# Patient Record
Sex: Female | Born: 1955
Health system: Southern US, Community
[De-identification: ages and names within clinical notes are randomized; demographics above are authoritative.]

## PROBLEM LIST (undated history)

## (undated) DIAGNOSIS — F341 Dysthymic disorder: Secondary | ICD-10-CM

## (undated) DIAGNOSIS — Z9989 Dependence on other enabling machines and devices: Secondary | ICD-10-CM

## (undated) DIAGNOSIS — G4730 Sleep apnea, unspecified: Secondary | ICD-10-CM

## (undated) DIAGNOSIS — M2020 Hallux rigidus, unspecified foot: Secondary | ICD-10-CM

## (undated) DIAGNOSIS — F419 Anxiety disorder, unspecified: Secondary | ICD-10-CM

## (undated) DIAGNOSIS — H269 Unspecified cataract: Secondary | ICD-10-CM

## (undated) DIAGNOSIS — I10 Essential (primary) hypertension: Secondary | ICD-10-CM

## (undated) DIAGNOSIS — G4733 Obstructive sleep apnea (adult) (pediatric): Secondary | ICD-10-CM

## (undated) DIAGNOSIS — R011 Cardiac murmur, unspecified: Secondary | ICD-10-CM

## (undated) DIAGNOSIS — K219 Gastro-esophageal reflux disease without esophagitis: Secondary | ICD-10-CM

## (undated) DIAGNOSIS — R7303 Prediabetes: Secondary | ICD-10-CM

## (undated) DIAGNOSIS — E039 Hypothyroidism, unspecified: Secondary | ICD-10-CM

## (undated) DIAGNOSIS — M159 Polyosteoarthritis, unspecified: Secondary | ICD-10-CM

## (undated) DIAGNOSIS — D259 Leiomyoma of uterus, unspecified: Secondary | ICD-10-CM

## (undated) DIAGNOSIS — Z961 Presence of intraocular lens: Secondary | ICD-10-CM

## (undated) DIAGNOSIS — M5030 Other cervical disc degeneration, unspecified cervical region: Secondary | ICD-10-CM

## (undated) DIAGNOSIS — E079 Disorder of thyroid, unspecified: Secondary | ICD-10-CM

## (undated) DIAGNOSIS — N63 Unspecified lump in unspecified breast: Secondary | ICD-10-CM

## (undated) DIAGNOSIS — M1990 Unspecified osteoarthritis, unspecified site: Secondary | ICD-10-CM

## (undated) DIAGNOSIS — E785 Hyperlipidemia, unspecified: Secondary | ICD-10-CM

## (undated) HISTORY — DX: Other cervical disc degeneration, unspecified cervical region: M50.30

## (undated) HISTORY — DX: Polyosteoarthritis, unspecified: M15.9

## (undated) HISTORY — DX: Hallux rigidus, unspecified foot: M20.20

## (undated) HISTORY — DX: Dysthymic disorder: F34.1

## (undated) HISTORY — PX: EYE SURGERY: SHX253

## (undated) HISTORY — DX: Sleep apnea, unspecified: G47.30

## (undated) HISTORY — DX: Obstructive sleep apnea (adult) (pediatric): Z99.89

## (undated) HISTORY — DX: Unspecified lump in unspecified breast: N63.0

## (undated) HISTORY — DX: Cardiac murmur, unspecified: R01.1

## (undated) HISTORY — PX: COLON SURGERY: SHX602

## (undated) HISTORY — DX: Obstructive sleep apnea (adult) (pediatric): G47.33

## (undated) HISTORY — DX: Unspecified cataract: H26.9

## (undated) HISTORY — DX: Hyperlipidemia, unspecified: E78.5

## (undated) HISTORY — DX: Gastro-esophageal reflux disease without esophagitis: K21.9

## (undated) HISTORY — DX: Disorder of thyroid, unspecified: E07.9

## (undated) HISTORY — DX: Leiomyoma of uterus, unspecified: D25.9

## (undated) HISTORY — PX: FRACTURE SURGERY: SHX138

## (undated) HISTORY — DX: Anxiety disorder, unspecified: F41.9

## (undated) HISTORY — DX: Unspecified osteoarthritis, unspecified site: M19.90

## (undated) HISTORY — DX: Essential (primary) hypertension: I10

## (undated) HISTORY — DX: Presence of intraocular lens: Z96.1

## (undated) HISTORY — PX: COLONOSCOPY: SHX174

## (undated) HISTORY — PX: BUNIONECTOMY: SHX129

## (undated) HISTORY — PX: APPENDECTOMY: SHX54

---
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
## 1990-09-11 HISTORY — PX: MYOMECTOMY: SHX85

## 2003-09-12 HISTORY — PX: THYROIDECTOMY: SHX17

## 2004-09-11 HISTORY — PX: ORIF FINGER FRACTURE: SHX2122

## 2013-09-11 HISTORY — PX: OTHER SURGICAL HISTORY: SHX169

## 2014-09-11 HISTORY — PX: CATARACT EXTRACTION: SUR2

## 2016-07-14 ENCOUNTER — Ambulatory Visit: Payer: Self-pay | Admitting: Family Medicine

## 2016-07-25 ENCOUNTER — Ambulatory Visit (INDEPENDENT_AMBULATORY_CARE_PROVIDER_SITE_OTHER): Payer: 59 | Admitting: Family Medicine

## 2016-07-25 ENCOUNTER — Encounter: Payer: Self-pay | Admitting: Family Medicine

## 2016-07-25 VITALS — BP 122/64 | HR 64 | Temp 98.7°F | Resp 14 | Ht 66.0 in | Wt 171.0 lb

## 2016-07-25 DIAGNOSIS — M159 Polyosteoarthritis, unspecified: Secondary | ICD-10-CM | POA: Diagnosis not present

## 2016-07-25 DIAGNOSIS — E890 Postprocedural hypothyroidism: Secondary | ICD-10-CM

## 2016-07-25 DIAGNOSIS — M5030 Other cervical disc degeneration, unspecified cervical region: Secondary | ICD-10-CM | POA: Diagnosis not present

## 2016-07-25 DIAGNOSIS — K219 Gastro-esophageal reflux disease without esophagitis: Secondary | ICD-10-CM

## 2016-07-25 DIAGNOSIS — F341 Dysthymic disorder: Secondary | ICD-10-CM | POA: Diagnosis not present

## 2016-07-25 DIAGNOSIS — I10 Essential (primary) hypertension: Secondary | ICD-10-CM

## 2016-07-25 DIAGNOSIS — E7800 Pure hypercholesterolemia, unspecified: Secondary | ICD-10-CM | POA: Diagnosis not present

## 2016-07-25 DIAGNOSIS — E785 Hyperlipidemia, unspecified: Secondary | ICD-10-CM | POA: Insufficient documentation

## 2016-07-25 DIAGNOSIS — E039 Hypothyroidism, unspecified: Secondary | ICD-10-CM | POA: Insufficient documentation

## 2016-07-25 DIAGNOSIS — F331 Major depressive disorder, recurrent, moderate: Secondary | ICD-10-CM | POA: Insufficient documentation

## 2016-07-25 MED ORDER — PANTOPRAZOLE SODIUM 20 MG PO TBEC: 20.0000 mg | DELAYED_RELEASE_TABLET | Freq: Every day | ORAL | 1 refills | Status: DC

## 2016-07-25 MED ORDER — PANTOPRAZOLE SODIUM 20 MG PO TBEC: 20.0000 mg | DELAYED_RELEASE_TABLET | Freq: Every day | ORAL | 6 refills | Status: DC

## 2016-07-25 MED ORDER — DULOXETINE HCL 30 MG PO CPEP: 30.0000 mg | ORAL_CAPSULE | Freq: Every day | ORAL | 1 refills | Status: DC

## 2016-07-25 MED ORDER — LEVOTHYROXINE SODIUM 125 MCG PO TABS: 125.0000 ug | ORAL_TABLET | Freq: Every day | ORAL | 1 refills | Status: DC

## 2016-07-25 MED ORDER — ATENOLOL 25 MG PO TABS: 25.0000 mg | ORAL_TABLET | Freq: Every day | ORAL | 1 refills | Status: DC

## 2016-07-25 MED ORDER — ATORVASTATIN CALCIUM 10 MG PO TABS: 10.0000 mg | ORAL_TABLET | Freq: Every day | ORAL | 1 refills | Status: DC

## 2016-07-25 NOTE — Progress Notes (Signed)
Subjective:    Patient ID: Destiny Graham, female    DOB: [birth year], 60 y.o.   MRN: [id number]  Patient presents for [facility] (is not fasting) Patient to establish care. 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  She moved here from [place]. History of hypothyroidism postsurgical she had benign dysplastic nodule removed in 2005 she is on lifelong thyroid replacement. She last had labs done in July of this year.  Hypertension hyperlipidemia diagnosed around the age of 63 father had early coronary artery disease including heart attack carotid stenosis and vascular dementia. She started on statin drugs Lipitor as well as blood pressure medicine at that time   Acid reflux she is currently on omeprazole however her insurance covers pantoprazole would like to switch to this.  Osteoarthritis as well as cervical degenerative disc disease causes some discomfort she tries to stay active.  Obstructive sleep apnea she uses CPAP on a regular basis.  History of dysthymia she has strong family history of depression she's been on Cymbalta secondary to her pain with her arthritis in her depressed mood. She was seen by psychiatry in the past.  Colonoscopy last done at age 29 she also had Pap smear at age 28 she gets her mammogram done every 2 years. She is not sexually active. Immunizations are up-to-date. Bone density also done in the past has been normal.      Review Of Systems:  GEN- denies fatigue, fever, weight loss,weakness, recent illness HEENT- denies eye drainage, change in vision, nasal discharge, CVS- denies chest pain, palpitations RESP- denies SOB, cough, wheeze ABD- denies N/V, change in stools, abd pain GU- denies dysuria, hematuria, dribbling, incontinence MSK- +joint pain, muscle aches, injury Neuro- denies headache, dizziness, syncope, seizure activity       Objective:    BP 122/64 (BP Location: Left Arm, Patient Position: Sitting, Cuff Size:  Normal)   Pulse 64   Temp 98.7 F (37.1 C) (Oral)   Resp 14   Ht 5\' 6"  (1.676 m)   Wt 171 lb (77.6 kg)   SpO2 98%   BMI 27.60 kg/m  GEN- NAD, alert and oriented x3 HEENT- PERRL, EOMI, non injected sclera, pink conjunctiva, MMM, oropharynx clear Neck- Supple, scar noted  CVS- RRR, no murmur RESP-CTAB ABD-NABS,soft,NT,ND Psych- Normal affect and mood EXT- No edema Pulses- Radial, DP- 2+        Assessment & Plan:      Problem List Items Addressed This Visit    Hypothyroidism   Relevant Medications   atenolol (TENORMIN) 25 MG tablet   levothyroxine (SYNTHROID, LEVOTHROID) 125 MCG tablet   Hyperlipidemia    With family history of CAD early age, HTN, hyperlipidemia, recommend starting ASA 81mg  once a day       Relevant Medications   atenolol (TENORMIN) 25 MG tablet   atorvastatin (LIPITOR) 10 MG tablet   GERD (gastroesophageal reflux disease)    CHANGE TO protonix      Relevant Medications   pantoprazole (PROTONIX) 20 MG tablet   Generalized OA   Relevant Medications   acetaminophen (TYLENOL) 325 MG tablet   naproxen sodium (ANAPROX) 220 MG tablet   Essential hypertension    Well controlled obtain records, plan for fasting labs at CPE in Jan      Relevant Medications   atenolol (TENORMIN) 25 MG tablet   atorvastatin (LIPITOR) 10 MG tablet   Dysthymic disorder    Continue Cymbalta      Relevant Medications  DULoxetine (CYMBALTA) 30 MG capsule   DDD (degenerative disc disease), cervical   Relevant Medications   acetaminophen (TYLENOL) 325 MG tablet   naproxen sodium (ANAPROX) 220 MG tablet      Note: This dictation was prepared with Dragon dictation along with smaller phrase technology. Any transcriptional errors that result from this process are unintentional.

## 2016-07-25 NOTE — Patient Instructions (Signed)
Release no of records Start 81mg  Change protonix  F/U January Physical

## 2016-07-26 ENCOUNTER — Encounter: Payer: Self-pay | Admitting: Family Medicine

## 2016-07-26 DIAGNOSIS — K219 Gastro-esophageal reflux disease without esophagitis: Secondary | ICD-10-CM | POA: Insufficient documentation

## 2016-07-26 DIAGNOSIS — M5030 Other cervical disc degeneration, unspecified cervical region: Secondary | ICD-10-CM | POA: Insufficient documentation

## 2016-07-26 DIAGNOSIS — M159 Polyosteoarthritis, unspecified: Secondary | ICD-10-CM | POA: Insufficient documentation

## 2016-07-26 NOTE — Assessment & Plan Note (Signed)
CHANGE TO protonix

## 2016-07-26 NOTE — Assessment & Plan Note (Signed)
Continue Cymbalta.

## 2016-07-26 NOTE — Assessment & Plan Note (Signed)
Well controlled obtain records, plan for fasting labs at CPE in Jan

## 2016-07-26 NOTE — Assessment & Plan Note (Signed)
With family history of CAD early age, HTN, hyperlipidemia, recommend starting ASA 81mg  once a day

## 2016-09-20 ENCOUNTER — Ambulatory Visit: Payer: Self-pay | Admitting: Orthopaedic Surgery

## 2016-09-22 ENCOUNTER — Telehealth: Payer: Self-pay | Admitting: Family Medicine

## 2016-09-22 NOTE — Telephone Encounter (Signed)
Patient is scheduled for CPE at the end of the month. She would like her lab orders put in for the [facility] lab in [place].  CB# [phone]

## 2016-09-25 ENCOUNTER — Other Ambulatory Visit: Payer: Self-pay | Admitting: *Deleted

## 2016-09-25 DIAGNOSIS — E7800 Pure hypercholesterolemia, unspecified: Secondary | ICD-10-CM

## 2016-09-25 DIAGNOSIS — E559 Vitamin D deficiency, unspecified: Secondary | ICD-10-CM

## 2016-09-25 DIAGNOSIS — I10 Essential (primary) hypertension: Secondary | ICD-10-CM

## 2016-09-25 DIAGNOSIS — E890 Postprocedural hypothyroidism: Secondary | ICD-10-CM

## 2016-09-25 NOTE — Telephone Encounter (Signed)
Lab orders faxed to [facility].

## 2016-09-27 ENCOUNTER — Ambulatory Visit: Payer: Self-pay | Admitting: Orthopaedic Surgery

## 2016-10-04 ENCOUNTER — Encounter: Payer: Self-pay | Admitting: Orthopaedic Surgery

## 2016-10-04 ENCOUNTER — Ambulatory Visit (INDEPENDENT_AMBULATORY_CARE_PROVIDER_SITE_OTHER): Payer: 59 | Admitting: Orthopaedic Surgery

## 2016-10-04 VITALS — BP 129/82 | HR 65 | Temp 97.5°F | Ht 66.5 in | Wt 171.0 lb

## 2016-10-04 DIAGNOSIS — M65311 Trigger thumb, right thumb: Secondary | ICD-10-CM | POA: Diagnosis not present

## 2016-10-04 NOTE — Progress Notes (Signed)
Subjective:    Patient ID: Destiny Graham, female    DOB: [birth year], 61 y.o.   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  She has no trauma.  She had an appointment here two weeks ago but I had to leave out of town for family emergency.  Last week it snowed an the office was closed.  She has had less triggering of the right thumb over the last few days but it varies.  She has no redness or numbness.  She has some swelling over the A1 pulley and may have a very small ganglion there as well.  Conservative treatment has not helped.    I have told her that the definitive treatment for this problem is surgical release of the A1 pulley.  I will inject the area today and hopefully it will help for some time.  Sh eis agreeable to this.   Review of Systems  HENT: Negative for congestion.   Respiratory: Negative for cough and shortness of breath.   Cardiovascular: Negative for chest pain and leg swelling.  Endocrine: Negative for cold intolerance.  Musculoskeletal: Positive for arthralgias.  Allergic/Immunologic: Negative for environmental allergies.   Past Medical History:  Diagnosis Date  . Breast nodule   . DDD (degenerative disc disease), cervical   . Dysthymia   . Generalized OA   . GERD (gastroesophageal reflux disease)   . Hallux rigidus   . Heart murmur   . Hyperlipidemia   . Hypertension   . OSA on CPAP   . Pseudophakia   . Thyroid disease   . Uterine fibroid     Past Surgical History:  Procedure Laterality Date  . BUNIONECTOMY    . CATARACT EXTRACTION  2016  . fusion   2015   first MTP B  . MYOMECTOMY  1992  . ORIF FINGER FRACTURE  2006  . THYROIDECTOMY  2005  . TONSILLECTOMY  1960    Current Outpatient Prescriptions on File Prior to Visit  Medication Sig Dispense Refill  . atenolol (TENORMIN) 25 MG tablet Take 1 tablet (25 mg total) by mouth daily. 90 tablet 1  . atorvastatin  (LIPITOR) 10 MG tablet Take 1 tablet (10 mg total) by mouth daily. 90 tablet 1  . levothyroxine (SYNTHROID, LEVOTHROID) 125 MCG tablet Take 1 tablet (125 mcg total) by mouth daily before breakfast. 90 tablet 1  . pantoprazole (PROTONIX) 20 MG tablet Take 1 tablet (20 mg total) by mouth daily. 90 tablet 1  . acetaminophen (TYLENOL) 325 MG tablet Take 650 mg by mouth every 6 (six) hours as needed.    . cetirizine (ZYRTEC) 10 MG chewable tablet Chew 10 mg by mouth daily.    . Cholecalciferol (VITAMIN D) 2000 units CAPS Take by mouth.    . doxylamine, Sleep, (UNISOM) 25 MG tablet Take 12.5 mg by mouth at bedtime as needed.    . DULoxetine (CYMBALTA) 30 MG capsule Take 1 capsule (30 mg total) by mouth daily. 90 capsule 1  . naproxen sodium (ANAPROX) 220 MG tablet Take 440 mg by mouth 2 (two) times daily as needed.     No current facility-administered medications on file prior to visit.     Social History   Social History  . Marital status: Divorced    Spouse name: N/A  . Number of children: N/A  . Years of education: N/A   Occupational History  .  Not on file.   Social History Main Topics  . Smoking status: Never Smoker  . Smokeless tobacco: Never Used  . Alcohol use Yes  . Drug use: No  . Sexual activity: Not Currently   Other Topics Concern  . Not on file   Social History Narrative  . No narrative on file    Family History  Problem Relation Age of Onset  . Arthritis Mother   . Depression Mother   . COPD Father   . Hearing loss Father   . Heart disease Father   . Hyperlipidemia Father   . Hypertension Father   . Kidney disease Father   . Alcohol abuse Father   . Depression Sister   . Alcohol abuse Sister   . Heart disease Maternal Grandmother     CHF  . COPD Maternal Grandfather   . Alcohol abuse Paternal Grandmother   . Depression Sister   . Heart disease Sister   . Hyperlipidemia Sister   . Depression Sister   . Heart disease Sister   . Hyperlipidemia Sister      BP 129/82   Pulse 65   Temp 97.5 F (36.4 C)   Ht 5' 6.5" (1.689 m)   Wt 171 lb (77.6 kg)   BMI 27.19 kg/m      Objective:   Physical Exam  Constitutional: She is oriented to person, place, and time. She appears well-developed and well-nourished.  HENT:  Head: Normocephalic and atraumatic.  Eyes: Conjunctivae and EOM are normal. Pupils are equal, round, and reactive to light.  Neck: Normal range of motion. Neck supple.  Cardiovascular: Normal rate, regular rhythm and intact distal pulses.   Pulmonary/Chest: Effort normal.  Abdominal: Soft.  Musculoskeletal: She exhibits tenderness (The right dominant thumb has locking at the A1 pulley but not consistently.  there is some swelling at the a! pulley area and a very small ganglion may be present.  NV intact.  Other hand negative.).  Neurological: She is alert and oriented to person, place, and time. She displays normal reflexes. No cranial nerve deficit. She exhibits normal muscle tone. Coordination normal.  Skin: Skin is warm and dry.  Psychiatric: She has a normal mood and affect. Her behavior is normal. Judgment and thought content normal.          Assessment & Plan:   Encounter Diagnosis  Name Primary?  . Trigger thumb, right thumb Yes   Procedure note: After permission from the patient and prep of the skin, the A1 pulley area of the right thumb was injected by sterile technique with 1 % Xylocaine and 1 cc of DepoMedrol 40 tolerated well.  Return as needed.  Call if any problem.  Surgery may need to be done.  Electronically Signed Sanjuana Kava, MD 1/24/20189:38 AM

## 2016-10-06 ENCOUNTER — Other Ambulatory Visit: Payer: Self-pay | Admitting: Family Medicine

## 2016-10-06 DIAGNOSIS — E7800 Pure hypercholesterolemia, unspecified: Secondary | ICD-10-CM | POA: Diagnosis not present

## 2016-10-06 DIAGNOSIS — E559 Vitamin D deficiency, unspecified: Secondary | ICD-10-CM | POA: Diagnosis not present

## 2016-10-06 DIAGNOSIS — E890 Postprocedural hypothyroidism: Secondary | ICD-10-CM | POA: Diagnosis not present

## 2016-10-06 DIAGNOSIS — I10 Essential (primary) hypertension: Secondary | ICD-10-CM | POA: Diagnosis not present

## 2016-10-06 LAB — CBC WITH DIFFERENTIAL/PLATELET
BASOS PCT: 1 %
Basophils Absolute: 58 cells/uL (ref 0–200)
EOS ABS: 174 {cells}/uL (ref 15–500)
Eosinophils Relative: 3 %
HEMATOCRIT: 39.7 % (ref 35.0–45.0)
Hemoglobin: 12.7 g/dL (ref 12.0–15.0)
Lymphocytes Relative: 38 %
Lymphs Abs: 2204 cells/uL (ref 850–3900)
MCH: 29.9 pg (ref 27.0–33.0)
MCHC: 32 g/dL (ref 32.0–36.0)
MCV: 93.4 fL (ref 80.0–100.0)
MONO ABS: 348 {cells}/uL (ref 200–950)
MONOS PCT: 6 %
MPV: 9.8 fL (ref 7.5–12.5)
Neutro Abs: 3016 cells/uL (ref 1500–7800)
Neutrophils Relative %: 52 %
PLATELETS: 326 10*3/uL (ref 140–400)
RBC: 4.25 MIL/uL (ref 3.80–5.10)
RDW: 14.2 % (ref 11.0–15.0)
WBC: 5.8 10*3/uL (ref 3.8–10.8)

## 2016-10-06 LAB — TSH: TSH: 1.85 m[IU]/L

## 2016-10-07 LAB — COMPREHENSIVE METABOLIC PANEL
ALT: 14 U/L (ref 6–29)
AST: 15 U/L (ref 10–35)
Albumin: 4 g/dL (ref 3.6–5.1)
Alkaline Phosphatase: 72 U/L (ref 33–130)
BUN: 15 mg/dL (ref 7–25)
CO2: 23 mmol/L (ref 20–31)
CREATININE: 1.04 mg/dL — AB (ref 0.50–0.99)
Calcium: 9.3 mg/dL (ref 8.6–10.4)
Chloride: 100 mmol/L (ref 98–110)
GLUCOSE: 96 mg/dL (ref 70–99)
Potassium: 4.3 mmol/L (ref 3.5–5.3)
SODIUM: 138 mmol/L (ref 135–146)
TOTAL PROTEIN: 6.3 g/dL (ref 6.1–8.1)
Total Bilirubin: 0.5 mg/dL (ref 0.2–1.2)

## 2016-10-07 LAB — LIPID PANEL
CHOL/HDL RATIO: 2.2 ratio (ref <5.0)
CHOLESTEROL: 196 mg/dL (ref <200)
HDL: 90 mg/dL (ref >50)
LDL CALC: 83 mg/dL (ref <100)
Triglycerides: 117 mg/dL (ref <150)
VLDL: 23 mg/dL (ref <30)

## 2016-10-07 LAB — VITAMIN D 25 HYDROXY (VIT D DEFICIENCY, FRACTURES): VIT D 25 HYDROXY: 38 ng/mL (ref 30–100)

## 2016-10-10 ENCOUNTER — Encounter: Payer: Self-pay | Admitting: Family Medicine

## 2016-10-10 ENCOUNTER — Ambulatory Visit (INDEPENDENT_AMBULATORY_CARE_PROVIDER_SITE_OTHER): Payer: 59 | Admitting: Family Medicine

## 2016-10-10 VITALS — BP 130/68 | HR 68 | Temp 98.9°F | Resp 18 | Ht 66.5 in | Wt 176.0 lb

## 2016-10-10 DIAGNOSIS — E890 Postprocedural hypothyroidism: Secondary | ICD-10-CM | POA: Diagnosis not present

## 2016-10-10 DIAGNOSIS — K219 Gastro-esophageal reflux disease without esophagitis: Secondary | ICD-10-CM

## 2016-10-10 DIAGNOSIS — F418 Other specified anxiety disorders: Secondary | ICD-10-CM | POA: Diagnosis not present

## 2016-10-10 DIAGNOSIS — F341 Dysthymic disorder: Secondary | ICD-10-CM

## 2016-10-10 DIAGNOSIS — Z0000 Encounter for general adult medical examination without abnormal findings: Secondary | ICD-10-CM | POA: Diagnosis not present

## 2016-10-10 DIAGNOSIS — E7800 Pure hypercholesterolemia, unspecified: Secondary | ICD-10-CM

## 2016-10-10 DIAGNOSIS — Z9989 Dependence on other enabling machines and devices: Secondary | ICD-10-CM | POA: Diagnosis not present

## 2016-10-10 DIAGNOSIS — G4733 Obstructive sleep apnea (adult) (pediatric): Secondary | ICD-10-CM

## 2016-10-10 MED ORDER — DULOXETINE HCL 40 MG PO CPEP: 40.0000 mg | ORAL_CAPSULE | Freq: Every day | ORAL | 6 refills | Status: DC

## 2016-10-10 MED ORDER — TRETINOIN (FACIAL WRINKLES) 0.02 % EX CREA: TOPICAL_CREAM | CUTANEOUS | 3 refills | Status: DC

## 2016-10-10 MED ORDER — ATORVASTATIN CALCIUM 20 MG PO TABS: 20.0000 mg | ORAL_TABLET | Freq: Every day | ORAL | 2 refills | Status: DC

## 2016-10-10 MED ORDER — PANTOPRAZOLE SODIUM 40 MG PO TBEC: 40.0000 mg | DELAYED_RELEASE_TABLET | Freq: Every day | ORAL | 2 refills | Status: DC

## 2016-10-10 NOTE — Patient Instructions (Signed)
F/U 6 months

## 2016-10-10 NOTE — Progress Notes (Signed)
   Subjective:    Patient ID: Destiny Graham, female    DOB: [birth year], 61 y.o.   MRN: [id number]  Patient presents for CPE (has had labs) and Wart (would like cryo on wart to hand)  1. Family history of CAD, sister recently diagnosed with Carotid disease, she therefore increased lipitor to 20mg    2. GERD- 20mg  of protonix did not control symptoms, wants to resume 40mg    3. Wart to Left index, has tried OTC remedies has not resolved present for a few months  4. Request script for Renova for wrinkles has used in past   5. CPAP- Doing well , no concerns   6. Anxiety- has had increased stress/anxiety with work, she is not happy in current practice, this has caused more stress, racing thoughts, sleep is okay. Taking Cymbalta 30mg   7. Trigger finger- had injection by Dr. Luna Glasgow   Colonoscopy- UTD  Mammogram- DUE PAP Smear - utd   Review Of Systems:  GEN- denies fatigue, fever, weight loss,weakness, recent illness HEENT- denies eye drainage, change in vision, nasal discharge, CVS- denies chest pain, palpitations RESP- denies SOB, cough, wheeze ABD- denies N/V, change in stools, abd pain GU- denies dysuria, hematuria, dribbling, incontinence MSK- denies joint pain, muscle aches, injury Neuro- denies headache, dizziness, syncope, seizure activity       Objective:    BP 130/68 (BP Location: Left Arm, Patient Position: Sitting, Cuff Size: Normal)   Pulse 68   Temp 98.9 F (37.2 C) (Oral)   Resp 18   Ht 5' 6.5" (1.689 m)   Wt 176 lb (79.8 kg)   SpO2 98%   BMI 27.98 kg/m  GEN- NAD, alert and oriented x3 HEENT- PERRL, EOMI, non injected sclera, pink conjunctiva, MMM, oropharynx clear, TM clear bilat  Neck- Supple, no thyromegaly CVS- RRR, no murmur RESP-CTAB ABD-NABS,soft,NT,ND Psych normal affect and mood Skin- left index finger small wart at edge medial aspect of nail MSK- triggering right thumb EXT- No edema Pulses- Radial, DP- 2+  Cryotherapy:  Verbal consent  obtained, Liquid nitrogen delivered via spray gun 10sec x 2 passes to wart, pt tolerated well       Assessment & Plan:      Problem List Items Addressed This Visit    OSA on CPAP    continues to benefit from CPAP Therapy      Hypothyroidism    TFT at goal      Hyperlipidemia    Continue liptor 20mg       Relevant Medications   atorvastatin (LIPITOR) 20 MG tablet   GERD (gastroesophageal reflux disease)    Increase protonix  to 40mg        Relevant Medications   pantoprazole (PROTONIX) 40 MG tablet   Dysthymic disorder    Anxiety with depresison symptoms Increase cymbalta to 40mg       Relevant Medications   DULoxetine 40 MG CPEP    Other Visit Diagnoses    Routine general medical examination at a health care facility    -  Primary   CPE done, labs reviewed, pt to schedule mammogram. Cyrotherapy for wart done. RETIN SCRIPT GIVEN    Situational anxiety       Relevant Medications   DULoxetine 40 MG CPEP      Note: This dictation was prepared with Dragon dictation along with smaller phrase technology. Any transcriptional errors that result from this process are unintentional.

## 2016-10-11 NOTE — Assessment & Plan Note (Signed)
Continue liptor 20mg 

## 2016-10-11 NOTE — Assessment & Plan Note (Signed)
Anxiety with depresison symptoms Increase cymbalta to 40mg 

## 2016-10-11 NOTE — Assessment & Plan Note (Signed)
TFT at goal 

## 2016-10-11 NOTE — Assessment & Plan Note (Addendum)
Increase protonix  to 40mg 

## 2016-10-11 NOTE — Assessment & Plan Note (Signed)
continues to benefit from CPAP Therapy

## 2016-10-19 DIAGNOSIS — G4733 Obstructive sleep apnea (adult) (pediatric): Secondary | ICD-10-CM | POA: Diagnosis not present

## 2016-11-07 DIAGNOSIS — K1379 Other lesions of oral mucosa: Secondary | ICD-10-CM | POA: Diagnosis not present

## 2016-12-13 ENCOUNTER — Emergency Department (HOSPITAL_COMMUNITY): Payer: 59

## 2016-12-13 ENCOUNTER — Inpatient Hospital Stay (HOSPITAL_COMMUNITY)
Admission: EM | Admit: 2016-12-13 | Discharge: 2016-12-14 | DRG: 866 | Disposition: A | Discharge status: Home / Self Care | Payer: 59 | Attending: Internal Medicine | Admitting: Internal Medicine

## 2016-12-13 ENCOUNTER — Encounter (HOSPITAL_COMMUNITY): Payer: Self-pay | Admitting: *Deleted

## 2016-12-13 ENCOUNTER — Emergency Department (HOSPITAL_COMMUNITY)
Admission: EM | Admit: 2016-12-13 | Discharge: 2016-12-13 | Disposition: A | Discharge status: Home / Self Care | Payer: 59 | Source: Home / Self Care | Attending: Emergency Medicine | Admitting: Emergency Medicine

## 2016-12-13 DIAGNOSIS — G4733 Obstructive sleep apnea (adult) (pediatric): Secondary | ICD-10-CM | POA: Diagnosis present

## 2016-12-13 DIAGNOSIS — N12 Tubulo-interstitial nephritis, not specified as acute or chronic: Secondary | ICD-10-CM | POA: Diagnosis present

## 2016-12-13 DIAGNOSIS — Z825 Family history of asthma and other chronic lower respiratory diseases: Secondary | ICD-10-CM | POA: Diagnosis not present

## 2016-12-13 DIAGNOSIS — Z888 Allergy status to other drugs, medicaments and biological substances status: Secondary | ICD-10-CM | POA: Diagnosis not present

## 2016-12-13 DIAGNOSIS — N390 Urinary tract infection, site not specified: Secondary | ICD-10-CM

## 2016-12-13 DIAGNOSIS — K219 Gastro-esophageal reflux disease without esophagitis: Secondary | ICD-10-CM | POA: Diagnosis present

## 2016-12-13 DIAGNOSIS — R531 Weakness: Secondary | ICD-10-CM | POA: Diagnosis not present

## 2016-12-13 DIAGNOSIS — F17290 Nicotine dependence, other tobacco product, uncomplicated: Secondary | ICD-10-CM | POA: Diagnosis present

## 2016-12-13 DIAGNOSIS — M159 Polyosteoarthritis, unspecified: Secondary | ICD-10-CM | POA: Diagnosis present

## 2016-12-13 DIAGNOSIS — I959 Hypotension, unspecified: Secondary | ICD-10-CM | POA: Diagnosis present

## 2016-12-13 DIAGNOSIS — R05 Cough: Secondary | ICD-10-CM

## 2016-12-13 DIAGNOSIS — Z8249 Family history of ischemic heart disease and other diseases of the circulatory system: Secondary | ICD-10-CM

## 2016-12-13 DIAGNOSIS — N10 Acute tubulo-interstitial nephritis: Secondary | ICD-10-CM

## 2016-12-13 DIAGNOSIS — Z882 Allergy status to sulfonamides status: Secondary | ICD-10-CM | POA: Diagnosis not present

## 2016-12-13 DIAGNOSIS — E785 Hyperlipidemia, unspecified: Secondary | ICD-10-CM | POA: Diagnosis present

## 2016-12-13 DIAGNOSIS — E860 Dehydration: Secondary | ICD-10-CM

## 2016-12-13 DIAGNOSIS — E890 Postprocedural hypothyroidism: Secondary | ICD-10-CM | POA: Diagnosis present

## 2016-12-13 DIAGNOSIS — Z8261 Family history of arthritis: Secondary | ICD-10-CM

## 2016-12-13 DIAGNOSIS — I10 Essential (primary) hypertension: Secondary | ICD-10-CM | POA: Diagnosis present

## 2016-12-13 DIAGNOSIS — Z79899 Other long term (current) drug therapy: Secondary | ICD-10-CM

## 2016-12-13 DIAGNOSIS — E039 Hypothyroidism, unspecified: Secondary | ICD-10-CM | POA: Insufficient documentation

## 2016-12-13 DIAGNOSIS — Z841 Family history of disorders of kidney and ureter: Secondary | ICD-10-CM

## 2016-12-13 DIAGNOSIS — R509 Fever, unspecified: Secondary | ICD-10-CM

## 2016-12-13 DIAGNOSIS — B349 Viral infection, unspecified: Principal | ICD-10-CM | POA: Diagnosis present

## 2016-12-13 LAB — CBC WITH DIFFERENTIAL/PLATELET
BASOS PCT: 0 %
Basophils Absolute: 0 10*3/uL (ref 0.0–0.1)
Basophils Absolute: 0 10*3/uL (ref 0.0–0.1)
Basophils Relative: 0 %
EOS ABS: 0.1 10*3/uL (ref 0.0–0.7)
Eosinophils Absolute: 0.1 10*3/uL (ref 0.0–0.7)
Eosinophils Relative: 1 %
Eosinophils Relative: 1 %
HCT: 31.9 % — ABNORMAL LOW (ref 36.0–46.0)
HEMOGLOBIN: 10.6 g/dL — AB (ref 12.0–15.0)
LYMPHS ABS: 0.9 10*3/uL (ref 0.7–4.0)
LYMPHS PCT: 5 %
Lymphocytes Relative: 8 %
Lymphs Abs: 0.5 10*3/uL — ABNORMAL LOW (ref 0.7–4.0)
MCH: 31.3 pg (ref 26.0–34.0)
MCHC: 33.2 g/dL (ref 30.0–36.0)
MCV: 94.1 fL (ref 78.0–100.0)
MONO ABS: 0.7 10*3/uL (ref 0.1–1.0)
MONO ABS: 1.2 10*3/uL — AB (ref 0.1–1.0)
MONOS PCT: 7 %
Monocytes Relative: 11 %
NEUTROS ABS: 9.2 10*3/uL — AB (ref 1.7–7.7)
NEUTROS PCT: 87 %
NEUTROS PCT: 80 %
Neutro Abs: 9.3 10*3/uL — ABNORMAL HIGH (ref 1.7–7.7)
Platelets: 235 10*3/uL (ref 150–400)
RBC: 3.39 MIL/uL — ABNORMAL LOW (ref 3.87–5.11)
RDW: 13.9 % (ref 11.5–15.5)
WBC: 11.4 10*3/uL — ABNORMAL HIGH (ref 4.0–10.5)

## 2016-12-13 LAB — COMPREHENSIVE METABOLIC PANEL
ALBUMIN: 3.2 g/dL — AB (ref 3.5–5.0)
ALK PHOS: 77 U/L (ref 38–126)
ALT: 18 U/L (ref 14–54)
ANION GAP: 8 (ref 5–15)
AST: 25 U/L (ref 15–41)
BILIRUBIN TOTAL: 0.6 mg/dL (ref 0.3–1.2)
BUN: 10 mg/dL (ref 6–20)
CALCIUM: 8.7 mg/dL — AB (ref 8.9–10.3)
CO2: 27 mmol/L (ref 22–32)
Chloride: 101 mmol/L (ref 101–111)
Creatinine, Ser: 1.03 mg/dL — ABNORMAL HIGH (ref 0.44–1.00)
GFR, EST NON AFRICAN AMERICAN: 57 mL/min — AB (ref >60)
Glucose, Bld: 144 mg/dL — ABNORMAL HIGH (ref 65–99)
POTASSIUM: 3.4 mmol/L — AB (ref 3.5–5.1)
Sodium: 136 mmol/L (ref 135–145)
TOTAL PROTEIN: 6.1 g/dL — AB (ref 6.5–8.1)

## 2016-12-13 LAB — BASIC METABOLIC PANEL
Anion gap: 6 (ref 5–15)
BUN: 9 mg/dL (ref 6–20)
CHLORIDE: 101 mmol/L (ref 101–111)
CO2: 27 mmol/L (ref 22–32)
CREATININE: 0.99 mg/dL (ref 0.44–1.00)
Calcium: 7.6 mg/dL — ABNORMAL LOW (ref 8.9–10.3)
GFR calc Af Amer: >60 mL/min (ref >60)
GFR calc non Af Amer: 60 mL/min — ABNORMAL LOW (ref >60)
GLUCOSE: 149 mg/dL — AB (ref 65–99)
Potassium: 3.3 mmol/L — ABNORMAL LOW (ref 3.5–5.1)
Sodium: 134 mmol/L — ABNORMAL LOW (ref 135–145)

## 2016-12-13 LAB — CBC
HEMATOCRIT: 34.5 % — AB (ref 36.0–46.0)
HEMOGLOBIN: 11.7 g/dL — AB (ref 12.0–15.0)
MCH: 31.1 pg (ref 26.0–34.0)
MCHC: 33.9 g/dL (ref 30.0–36.0)
MCV: 91.8 fL (ref 78.0–100.0)
PLATELETS: 211 10*3/uL (ref 150–400)
RBC: 3.76 MIL/uL — AB (ref 3.87–5.11)
RDW: 13.5 % (ref 11.5–15.5)
WBC: 10.7 10*3/uL — ABNORMAL HIGH (ref 4.0–10.5)

## 2016-12-13 LAB — URINALYSIS, ROUTINE W REFLEX MICROSCOPIC
BILIRUBIN URINE: NEGATIVE
Bacteria, UA: NONE SEEN
GLUCOSE, UA: NEGATIVE mg/dL
KETONES UR: NEGATIVE mg/dL
Nitrite: NEGATIVE
PH: 5 (ref 5.0–8.0)
Protein, ur: 100 mg/dL — AB
SPECIFIC GRAVITY, URINE: 1.024 (ref 1.005–1.030)

## 2016-12-13 LAB — LACTIC ACID, PLASMA: LACTIC ACID, VENOUS: 1.8 mmol/L (ref 0.5–1.9)

## 2016-12-13 LAB — INFLUENZA PANEL BY PCR (TYPE A & B)
INFLBPCR: NEGATIVE
Influenza A By PCR: NEGATIVE

## 2016-12-13 LAB — TSH: TSH: 5.213 u[IU]/mL — ABNORMAL HIGH (ref 0.350–4.500)

## 2016-12-13 MED ORDER — ACETAMINOPHEN 500 MG PO TABS: 1000.0000 mg | ORAL_TABLET | Freq: Four times a day (QID) | ORAL | Status: DC | PRN

## 2016-12-13 MED ORDER — ONDANSETRON HCL 4 MG PO TABS
4.0000 mg | ORAL_TABLET | Freq: Four times a day (QID) | ORAL | Status: DC | PRN
  Administered 2016-12-13 – 2016-12-14 (×2): 4 mg via ORAL
  Filled 2016-12-13 (×2): qty 1

## 2016-12-13 MED ORDER — DULOXETINE HCL 20 MG PO CPEP
40.0000 mg | ORAL_CAPSULE | Freq: Every day | ORAL | Status: DC
  Administered 2016-12-14: 40 mg via ORAL
  Filled 2016-12-13 (×2): qty 2

## 2016-12-13 MED ORDER — SODIUM CHLORIDE 0.9% FLUSH
3.0000 mL | Freq: Two times a day (BID) | INTRAVENOUS | Status: DC
  Administered 2016-12-13 – 2016-12-14 (×2): 3 mL via INTRAVENOUS

## 2016-12-13 MED ORDER — ONDANSETRON HCL 4 MG/2ML IJ SOLN: 4.0000 mg | Freq: Four times a day (QID) | INTRAMUSCULAR | Status: DC | PRN

## 2016-12-13 MED ORDER — PANTOPRAZOLE SODIUM 40 MG PO TBEC
40.0000 mg | DELAYED_RELEASE_TABLET | Freq: Every day | ORAL | Status: DC
  Administered 2016-12-14: 40 mg via ORAL
  Filled 2016-12-13: qty 1

## 2016-12-13 MED ORDER — LEVOTHYROXINE SODIUM 25 MCG PO TABS
125.0000 ug | ORAL_TABLET | Freq: Every day | ORAL | Status: DC
  Administered 2016-12-14: 125 ug via ORAL
  Filled 2016-12-13: qty 1

## 2016-12-13 MED ORDER — DOXYLAMINE SUCCINATE (SLEEP) 25 MG PO TABS
12.5000 mg | ORAL_TABLET | Freq: Every evening | ORAL | Status: DC | PRN
  Filled 2016-12-13: qty 1

## 2016-12-13 MED ORDER — HYDROCODONE-ACETAMINOPHEN 5-325 MG PO TABS: 1.0000 | ORAL_TABLET | Freq: Four times a day (QID) | ORAL | 0 refills | Status: DC | PRN

## 2016-12-13 MED ORDER — ATORVASTATIN CALCIUM 20 MG PO TABS
20.0000 mg | ORAL_TABLET | Freq: Every day | ORAL | Status: DC
  Administered 2016-12-14: 20 mg via ORAL
  Filled 2016-12-13: qty 1

## 2016-12-13 MED ORDER — POTASSIUM CHLORIDE IN NACL 20-0.9 MEQ/L-% IV SOLN
INTRAVENOUS | Status: DC
  Administered 2016-12-13 – 2016-12-14 (×2): via INTRAVENOUS

## 2016-12-13 MED ORDER — ACETAMINOPHEN 500 MG PO TABS
1000.0000 mg | ORAL_TABLET | Freq: Once | ORAL | Status: AC
  Administered 2016-12-13: 1000 mg via ORAL
  Filled 2016-12-13: qty 2

## 2016-12-13 MED ORDER — ACETAMINOPHEN 650 MG RE SUPP
650.0000 mg | Freq: Four times a day (QID) | RECTAL | Status: DC | PRN
Start: 2016-12-13 — End: 2016-12-14

## 2016-12-13 MED ORDER — SODIUM CHLORIDE 0.9 % IV BOLUS (SEPSIS)
2000.0000 mL | Freq: Once | INTRAVENOUS | Status: AC
  Administered 2016-12-13: 2000 mL via INTRAVENOUS

## 2016-12-13 MED ORDER — HYDROCODONE-ACETAMINOPHEN 5-325 MG PO TABS
1.0000 | ORAL_TABLET | ORAL | Status: DC | PRN
  Administered 2016-12-13: 1 via ORAL
  Filled 2016-12-13: qty 1

## 2016-12-13 MED ORDER — OSELTAMIVIR PHOSPHATE 75 MG PO CAPS
75.0000 mg | ORAL_CAPSULE | Freq: Two times a day (BID) | ORAL | Status: DC
  Administered 2016-12-13 – 2016-12-14 (×2): 75 mg via ORAL
  Filled 2016-12-13 (×2): qty 1

## 2016-12-13 MED ORDER — DEXTROSE 5 % IV SOLN
1.0000 g | INTRAVENOUS | Status: DC
  Administered 2016-12-14: 1 g via INTRAVENOUS
  Filled 2016-12-13 (×4): qty 10

## 2016-12-13 MED ORDER — ONDANSETRON 4 MG PO TBDP: ORAL_TABLET | ORAL | 0 refills | Status: DC

## 2016-12-13 MED ORDER — ACETAMINOPHEN 325 MG PO TABS
650.0000 mg | ORAL_TABLET | Freq: Four times a day (QID) | ORAL | Status: DC | PRN
Start: 2016-12-13 — End: 2016-12-14
  Administered 2016-12-14 (×2): 650 mg via ORAL
  Filled 2016-12-13 (×2): qty 2

## 2016-12-13 MED ORDER — ONDANSETRON HCL 4 MG/2ML IJ SOLN
4.0000 mg | Freq: Once | INTRAMUSCULAR | Status: AC
  Administered 2016-12-13: 4 mg via INTRAVENOUS
  Filled 2016-12-13: qty 2

## 2016-12-13 MED ORDER — CEPHALEXIN 500 MG PO CAPS
500.0000 mg | ORAL_CAPSULE | Freq: Four times a day (QID) | ORAL | 0 refills | Status: DC
Start: 2016-12-13 — End: 2016-12-22

## 2016-12-13 MED ORDER — ATENOLOL 25 MG PO TABS
25.0000 mg | ORAL_TABLET | Freq: Every day | ORAL | Status: DC
  Administered 2016-12-14: 25 mg via ORAL
  Filled 2016-12-13: qty 1

## 2016-12-13 MED ORDER — PANTOPRAZOLE SODIUM 40 MG IV SOLR
40.0000 mg | Freq: Once | INTRAVENOUS | Status: AC
  Administered 2016-12-13: 40 mg via INTRAVENOUS
  Filled 2016-12-13: qty 40

## 2016-12-13 MED ORDER — LORATADINE 10 MG PO TABS
10.0000 mg | ORAL_TABLET | Freq: Every day | ORAL | Status: DC
  Administered 2016-12-14: 10 mg via ORAL
  Filled 2016-12-13: qty 1

## 2016-12-13 MED ORDER — DEXTROSE 5 % IV SOLN
1.0000 g | Freq: Once | INTRAVENOUS | Status: AC
  Administered 2016-12-13: 1 g via INTRAVENOUS
  Filled 2016-12-13: qty 10

## 2016-12-13 MED ORDER — ENOXAPARIN SODIUM 40 MG/0.4ML ~~LOC~~ SOLN
40.0000 mg | SUBCUTANEOUS | Status: DC
  Filled 2016-12-13: qty 0.4

## 2016-12-13 MED ORDER — SODIUM CHLORIDE 0.9 % IV BOLUS (SEPSIS)
1000.0000 mL | Freq: Once | INTRAVENOUS | Status: AC
  Administered 2016-12-13: 1000 mL via INTRAVENOUS

## 2016-12-13 NOTE — ED Notes (Signed)
Pt verbalized understanding of no driving and to use caution within 4 hours of taking pain meds due to meds cause drowsiness.  Instructed pt to take all of antibiotics as prescribed. 

## 2016-12-13 NOTE — ED Notes (Signed)
Pt offered motrin but states she doesn't want to take any nsaids because it causes gi irritation.

## 2016-12-13 NOTE — ED Provider Notes (Signed)
[facility] DEPT Provider Note   CSN: [id number] Arrival date & time: 12/13/16  0825 By signing my name below, I, Destiny Graham, attest that this documentation has been prepared under the direction and in the presence of Destiny Ferguson, MD. Electronically Signed: Georgette Graham, ED Scribe. 12/13/16. 8:51 AM.  History   Chief Complaint Chief Complaint  Patient presents with  . Emesis  . Flu-like symptoms    HPI The history is provided by the patient. No language interpreter was used.  Fever   This is a new problem. The current episode started 2 days ago. The problem occurs constantly. The problem has not changed since onset.The maximum temperature noted was 103 to 104 F. Associated symptoms include vomiting, muscle aches and cough. Pertinent negatives include no chest pain, no diarrhea, no congestion, no headaches and no sore throat. She has tried acetaminophen and ibuprofen for the symptoms. The treatment provided no relief.   HPI Comments: Destiny Graham is a 61 y.o. female with h/o GERD, HLD, HTN, and DDD, who presents to the Emergency Department complaining of fever (Tmax 104.4) beginning two days ago with associated nausea, generalized arthralgias/myalgias, and episodic vomiting that began early this morning. Pt notes she is unable to keep fluids down and is concerned she is dehydrated. She also reports she has been having a cough for the past four weeks which significantly worsened when her other symptoms began. She has been alternating Aleve and Tylenol along with Zofran with no relief to her symptoms. 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 Pt denies sore throat, diarrhea, urinary symptoms, or any other associated symptoms.   Past Medical History:  Diagnosis Date  . Breast nodule   . DDD (degenerative disc disease), cervical   . Dysthymia   . Generalized OA   . GERD (gastroesophageal reflux disease)   . Hallux rigidus   . Heart  murmur   . Hyperlipidemia   . Hypertension   . OSA on CPAP   . Pseudophakia   . Thyroid disease   . Uterine fibroid     Patient Active Problem List   Diagnosis Date Noted  . OSA on CPAP 10/10/2016  . GERD (gastroesophageal reflux disease) 07/26/2016  . DDD (degenerative disc disease), cervical 07/26/2016  . Generalized OA 07/26/2016  . Hypothyroidism 07/25/2016  . Essential hypertension 07/25/2016  . Hyperlipidemia 07/25/2016  . Dysthymic disorder 07/25/2016    Past Surgical History:  Procedure Laterality Date  . BUNIONECTOMY    . CATARACT EXTRACTION  2016  . fusion   2015   first MTP B  . MYOMECTOMY  1992  . ORIF FINGER FRACTURE  2006  . THYROIDECTOMY  2005  . TONSILLECTOMY  1960    OB History    No data available       Home Medications    Prior to Admission medications   Medication Sig Start Date End Date Taking? Authorizing Provider  acetaminophen (TYLENOL) 325 MG tablet Take 650 mg by mouth every 6 (six) hours as needed.    Historical Provider, MD  atenolol (TENORMIN) 25 MG tablet Take 1 tablet (25 mg total) by mouth daily. 07/25/16   Alycia Rossetti, MD  atorvastatin (LIPITOR) 20 MG tablet Take 1 tablet (20 mg total) by mouth daily. 10/10/16   Alycia Rossetti, MD  cetirizine (ZYRTEC) 10 MG chewable tablet Chew 10 mg by mouth daily.    Historical Provider, MD  Cholecalciferol (VITAMIN D) 2000 units CAPS  Take by mouth.    Historical Provider, MD  doxylamine, Sleep, (UNISOM) 25 MG tablet Take 12.5 mg by mouth at bedtime as needed.    Historical Provider, MD  DULoxetine 40 MG CPEP Take 40 mg by mouth daily. 10/10/16   Alycia Rossetti, MD  levothyroxine (SYNTHROID, LEVOTHROID) 125 MCG tablet Take 1 tablet (125 mcg total) by mouth daily before breakfast. 07/25/16   Alycia Rossetti, MD  naproxen sodium (ANAPROX) 220 MG tablet Take 440 mg by mouth 2 (two) times daily as needed.    Historical Provider, MD  pantoprazole (PROTONIX) 40 MG tablet Take 1 tablet (40 mg  total) by mouth daily. 10/10/16   Alycia Rossetti, MD  Tretinoin, Facial Wrinkles, (RENOVA) 0.02 % CREA Apply daily to face 10/10/16   Alycia Rossetti, MD    Family History Family History  Problem Relation Age of Onset  . Arthritis Mother   . Depression Mother   . COPD Father   . Hearing loss Father   . Heart disease Father   . Hyperlipidemia Father   . Hypertension Father   . Kidney disease Father   . Alcohol abuse Father   . Depression Sister   . Alcohol abuse Sister   . Heart disease Maternal Grandmother     CHF  . COPD Maternal Grandfather   . Alcohol abuse Paternal Grandmother   . Depression Sister   . Heart disease Sister   . Hyperlipidemia Sister   . Depression Sister   . Heart disease Sister   . Hyperlipidemia Sister     Social History Social History  Substance Use Topics  . Smoking status: Current Every Day Smoker    Types: Pipe, Cigars  . Smokeless tobacco: Never Used  . Alcohol use Yes     Comment: occ.     Allergies   Vioxx [rofecoxib] and Sulfa antibiotics   Review of Systems Review of Systems  Constitutional: Positive for fever. Negative for appetite change, chills and fatigue.  HENT: Negative for congestion, ear discharge, sinus pressure and sore throat.   Eyes: Negative for discharge.  Respiratory: Positive for cough.   Cardiovascular: Negative for chest pain.  Gastrointestinal: Positive for nausea and vomiting. Negative for abdominal pain and diarrhea.  Genitourinary: Negative for difficulty urinating, frequency and hematuria.  Musculoskeletal: Positive for arthralgias and myalgias. Negative for back pain.  Skin: Negative for rash.  Neurological: Negative for seizures and headaches.  Psychiatric/Behavioral: Negative for hallucinations.     Physical Exam Updated Vital Signs BP 119/68 (BP Location: Right Arm)   Pulse 98   Temp (!) 103.1 F (39.5 C) (Oral)   Resp 18   Ht 5\' 7"  (1.702 m)   Wt 170 lb (77.1 kg)   SpO2 97%   BMI 26.63  kg/m   Physical Exam  Constitutional: She is oriented to person, place, and time. She appears well-developed.  HENT:  Head: Normocephalic.  Mouth/Throat: Mucous membranes are dry.  Eyes: Conjunctivae and EOM are normal. No scleral icterus.  Neck: Neck supple. No thyromegaly present.  Cardiovascular: Normal rate and regular rhythm.  Exam reveals no gallop and no friction rub.   No murmur heard. Pulmonary/Chest: No stridor. She has no wheezes. She has no rales. She exhibits no tenderness.  Abdominal: She exhibits no distension. There is no tenderness. There is no rebound.  Musculoskeletal: Normal range of motion. She exhibits no edema.  Lymphadenopathy:    She has no cervical adenopathy.  Neurological: She is oriented to  person, place, and time. She exhibits normal muscle tone. Coordination normal.  Skin: No rash noted. No erythema.  Psychiatric: She has a normal mood and affect. Her behavior is normal.  Nursing note and vitals reviewed.    ED Treatments / Results  DIAGNOSTIC STUDIES: Oxygen Saturation is 97% on RA, adequate by my interpretation.   COORDINATION OF CARE: 8:50 AM-Discussed next steps with pt. Pt verbalized understanding and is agreeable with the plan.   Labs (all labs ordered are listed, but only abnormal results are displayed) Labs Reviewed  LIPASE, BLOOD  COMPREHENSIVE METABOLIC PANEL  CBC  URINALYSIS, ROUTINE W REFLEX MICROSCOPIC    EKG  EKG Interpretation None       Radiology No results found.  Procedures Procedures (including critical care time)  Medications Ordered in ED Medications - No data to display   Initial Impression / Assessment and Plan / ED Course  I have reviewed the triage vital signs and the nursing notes.  Pertinent labs & imaging results that were available during my care of the patient were reviewed by me and considered in my medical decision making (see chart for details).   patient with urinary tract infection  possible [place]. Patient will be placed on Keflex Zofran and Vicodin as needed and will follow-up with her doctor to 3 days   Final Clinical Impressions(s) / ED Diagnoses   Final diagnoses:  None    New Prescriptions New Prescriptions   No medications on file       Destiny Ferguson, MD 12/13/16 1212

## 2016-12-13 NOTE — Discharge Instructions (Signed)
Treatment planning of fluids follow-up with your doctor in 2-3 days for recheck

## 2016-12-13 NOTE — ED Notes (Signed)
Pts daughter is bringing pts CPAP from home.

## 2016-12-13 NOTE — H&P (Signed)
History and Physical    Destiny Graham UYQ:[id number] DOB: 05-Sep-1956 DOA: 12/13/2016  PCP: Vic Blackbird, MD  Patient coming from: Home.    Chief Complaint:  Weakness, fever, chills, and myalgia.   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  She was seen early in the ER, found to have negative Flu test, and UA with TNTC WBCs.  She was given IV Rocephin, and discharged to home, but she has been weak and had difficult getting around.  She has no flank pain, abdominal pain, SOB, or chest pain.  Evaluation showed no leukocytosis, with normal renal fx tests, and K of 3.3 mE/L.  She was hypotensive, but responded to IVF.  Her lactic acid was negative.  Hospitalist was asked to admit her for further evaluation.    ED Course:  See above.  Rewiew of Systems:  Constitutional:  No significant weight loss or weight gain Eyes: Negative for eye pain, redness and discharge, diplopia, visual changes, or flashes of light. ENMT: Negative for ear pain, hoarseness, nasal congestion, sinus pressure and sore throat. No headaches; tinnitus, drooling, or problem swallowing. Cardiovascular: Negative for chest pain, palpitations, diaphoresis, dyspnea and peripheral edema. ; No orthopnea, PND Respiratory: Negative for cough, hemoptysis, wheezing and stridor. No pleuritic chestpain. Gastrointestinal: Negative for diarrhea, constipation,  melena, blood in stool, hematemesis, jaundice and rectal bleeding.    Genitourinary: Negative for frequency, dysuria, incontinence,flank pain and hematuria; Musculoskeletal: Negative for back pain and neck pain. Negative for swelling and trauma.;  Skin: . Negative for pruritus, rash, abrasions, bruising and skin lesion.; ulcerations Neuro: Negative for headache, lightheadedness and neck stiffness. Negative for  altered level of consciousness , altered mental status,  burning feet, involuntary movement, seizure and syncope.  Psych: negative for anxiety, depression, insomnia, tearfulness, panic attacks, hallucinations, paranoia, suicidal or homicidal ideation    Past Medical History:  Diagnosis Date  . Breast nodule   . DDD (degenerative disc disease), cervical   . Dysthymia   . Generalized OA   . GERD (gastroesophageal reflux disease)   . Hallux rigidus   . Heart murmur   . Hyperlipidemia   . Hypertension   . OSA on CPAP   . Pseudophakia   . Thyroid disease   . Uterine fibroid     Past Surgical History:  Procedure Laterality Date  . BUNIONECTOMY    . CATARACT EXTRACTION  2016  . fusion   2015   first MTP B  . MYOMECTOMY  1992  . ORIF FINGER FRACTURE  2006  . THYROIDECTOMY  2005  . TONSILLECTOMY  1960     reports that she has been smoking Pipe and Cigars.  She has never used smokeless tobacco. She reports that she drinks alcohol. She reports that she does not use drugs.  Allergies  Allergen Reactions  . Vioxx [Rofecoxib]     Photodermatitis   . Sulfa Antibiotics Rash    Family History  Problem Relation Age of Onset  . Arthritis Mother   . Depression Mother   . COPD Father   . Hearing loss Father   . Heart disease Father   . Hyperlipidemia Father   . Hypertension Father   . Kidney disease Father   . Alcohol abuse Father   . Depression Sister   . Alcohol abuse Sister   . Heart disease  Maternal Grandmother     CHF  . COPD Maternal Grandfather   . Alcohol abuse Paternal Grandmother   . Depression Sister   . Heart disease Sister   . Hyperlipidemia Sister   . Depression Sister   . Heart disease Sister   . Hyperlipidemia Sister      Prior to Admission medications   Medication Sig Start Date End Date Taking? Authorizing Provider  acetaminophen (TYLENOL) 500 MG tablet Take 1,000 mg by mouth every 6 (six) hours as needed for mild pain or headache.    Yes Historical  Provider, MD  atenolol (TENORMIN) 25 MG tablet Take 1 tablet (25 mg total) by mouth daily. 07/25/16  Yes Alycia Rossetti, MD  atorvastatin (LIPITOR) 20 MG tablet Take 1 tablet (20 mg total) by mouth daily. 10/10/16  Yes Alycia Rossetti, MD  cetirizine (ZYRTEC) 10 MG chewable tablet Chew 10 mg by mouth daily as needed for allergies.    Yes Historical Provider, MD  Cholecalciferol (VITAMIN D) 2000 units CAPS Take by mouth.   Yes Historical Provider, MD  doxylamine, Sleep, (UNISOM) 25 MG tablet Take 12.5 mg by mouth at bedtime as needed.   Yes Historical Provider, MD  DULoxetine 40 MG CPEP Take 40 mg by mouth daily. 10/10/16  Yes Alycia Rossetti, MD  HYDROcodone-acetaminophen (NORCO/VICODIN) 5-325 MG tablet Take 1 tablet by mouth every 6 (six) hours as needed for moderate pain. 12/13/16  Yes Milton Ferguson, MD  levothyroxine (SYNTHROID, LEVOTHROID) 125 MCG tablet Take 1 tablet (125 mcg total) by mouth daily before breakfast. 07/25/16  Yes Alycia Rossetti, MD  naproxen sodium (ANAPROX) 220 MG tablet Take 440 mg by mouth 2 (two) times daily as needed.   Yes Historical Provider, MD  ondansetron (ZOFRAN ODT) 4 MG disintegrating tablet 4mg  ODT q4 hours prn nausea/vomit 12/13/16  Yes Milton Ferguson, MD  pantoprazole (PROTONIX) 40 MG tablet Take 1 tablet (40 mg total) by mouth daily. 10/10/16  Yes Alycia Rossetti, MD  Tretinoin, Facial Wrinkles, (RENOVA) 0.02 % CREA Apply daily to face 10/10/16  Yes Alycia Rossetti, MD  cephALEXin (KEFLEX) 500 MG capsule Take 1 capsule (500 mg total) by mouth 4 (four) times daily. 12/13/16   Milton Ferguson, MD    Physical Exam: Vitals:   12/13/16 1836  BP: 137/67  Pulse: 91  Resp: 18  Temp: (!) 103.2 F (39.6 C)  TempSrc: Oral  SpO2: 100%  Weight: 77.1 kg (170 lb)  Height: 5\' 7"  (1.702 m)      Constitutional: NAD, calm, comfortable Vitals:   12/13/16 1836  BP: 137/67  Pulse: 91  Resp: 18  Temp: (!) 103.2 F (39.6 C)  TempSrc: Oral  SpO2: 100%  Weight: 77.1  kg (170 lb)  Height: 5\' 7"  (1.702 m)   Eyes: PERRL, lids and conjunctivae normal ENMT: Mucous membranes are moist. Posterior pharynx clear of any exudate or lesions.Normal dentition.  Neck: normal, supple, no masses, no thyromegaly Respiratory: clear to auscultation bilaterally, no wheezing, no crackles. Normal respiratory effort. No accessory muscle use.  Cardiovascular: Regular rate and rhythm, no murmurs / rubs / gallops. No extremity edema. 2+ pedal pulses. No carotid bruits.  Abdomen: no tenderness, no masses palpated. No hepatosplenomegaly. Bowel sounds positive.  Musculoskeletal: no clubbing / cyanosis. No joint deformity upper and lower extremities. Good ROM, no contractures. Normal muscle tone.  Skin: no rashes, lesions, ulcers. No induration Neurologic: CN 2-12 grossly intact. Sensation intact, DTR normal. Strength 5/5 in all 4.  Psychiatric: Normal judgment and insight. Alert and oriented x 3. Normal mood.    Labs on Admission: I have personally reviewed following labs and imaging studies  CBC:  Recent Labs Lab 12/13/16 0836 92/11/94 1740  WBC DUPLICATE  81.4* 48.1*  NEUTROABS 9.2* 9.3*  HGB DUPLICATE  85.6* 31.4*  HCT DUPLICATE  97.0* 26.3*  MCV DUPLICATE  78.5 88.5  PLT DUPLICATE  027 741   Basic Metabolic Panel:  Recent Labs Lab 12/13/16 0836 12/13/16 1930  NA 136 134*  K 3.4* 3.3*  CL 101 101  CO2 27 27  GLUCOSE 144* 149*  BUN 10 9  CREATININE 1.03* 0.99  CALCIUM 8.7* 7.6*   GFR: Estimated Creatinine Clearance: 63.1 mL/min (by C-G formula based on SCr of 0.99 mg/dL). Liver Function Tests:  Recent Labs Lab 12/13/16 0836  AST 25  ALT 18  ALKPHOS 77  BILITOT 0.6  PROT 6.1*  ALBUMIN 3.2*   Urine analysis:    Component Value Date/Time   COLORURINE YELLOW 12/13/2016 0858   APPEARANCEUR CLOUDY (A) 12/13/2016 0858   LABSPEC 1.024 12/13/2016 0858   PHURINE 5.0 12/13/2016 0858   GLUCOSEU NEGATIVE 12/13/2016 0858   HGBUR SMALL (A) 12/13/2016  0858   BILIRUBINUR NEGATIVE 12/13/2016 0858   KETONESUR NEGATIVE 12/13/2016 0858   PROTEINUR 100 (A) 12/13/2016 0858   NITRITE NEGATIVE 12/13/2016 0858   LEUKOCYTESUR SMALL (A) 12/13/2016 0858    Radiological Exams on Admission: Dg Chest 2 View  Result Date: 12/13/2016 CLINICAL DATA:  Cough EXAM: CHEST  2 VIEW COMPARISON:  None. FINDINGS: There is no edema or consolidation. The heart size and pulmonary vascularity are normal. No adenopathy. There is mild degenerative change in the thoracic spine. IMPRESSION: No edema or consolidation. Electronically Signed   By: Lowella Grip III M.D.   On: 12/13/2016 09:45    EKG: Independently reviewed.   Assessment/Plan Principal Problem:   Acute viral syndrome Active Problems:   Thyroid disease   Hypertension   Acute pyelonephritis   Pyelonephritis    PLAN:   Viral syndrome:  I think she has the flu though testing was negative.  Will place on droplet precaution, and start Tamiflu.  Will tx with IVF, pain meds, and anti emetics.  UTI:  Possibly pyelonephritis.  BC were obtained.  Will continue with IV Rocephin.   BP is responding to IVF.  Lactic was negative. Doubt sepsis.   Hypothyroidism:  Will continue with supplement.  Check TSH.    HypoK:   Borderline.  Will give K in IVF>   Recheck in am.    DVT prophylaxis: Lovenox.  Code Status: FULL CODE.  Family Communication: 2 daughters at bedside.  Disposition Plan: home.  Consults called: None. Admission status: Inpatient.    Amorette Charrette MD FACP. [facility]  If 7PM-7AM, please contact night-coverage [email or web address] Password [email or web address]  12/13/2016, 9:11 PM

## 2016-12-13 NOTE — ED Provider Notes (Signed)
[facility] DEPT Provider Note   CSN: [id number] Arrival date & time: 12/13/16  1828     History   Chief Complaint Chief Complaint  Patient presents with  . Fever    HPI Destiny Graham is a 61 y.o. female.  HPI  Pt was seen at 1900. Per pt, c/o gradual onset and worsening of persistent generalized weakness/fatigue that began 2 days ago. Has been associated with generalized body aches, cough, N/V. Pt was evaluated in the ED earlier today, dx UTI, possible pyelonephritis, and d/c after IVF, IV rocephin, rx keflex. Pt states she has been too fatigued to stand up, continues to have fevers despite PO APAP (LD 1800). Denies vomiting/diarrhea, no abd pain, no CP/SOB, no back pain, no rash.    Past Medical History:  Diagnosis Date  . Breast nodule   . DDD (degenerative disc disease), cervical   . Dysthymia   . Generalized OA   . GERD (gastroesophageal reflux disease)   . Hallux rigidus   . Heart murmur   . Hyperlipidemia   . Hypertension   . OSA on CPAP   . Pseudophakia   . Thyroid disease   . Uterine fibroid     Patient Active Problem List   Diagnosis Date Noted  . OSA on CPAP 10/10/2016  . GERD (gastroesophageal reflux disease) 07/26/2016  . DDD (degenerative disc disease), cervical 07/26/2016  . Generalized OA 07/26/2016  . Hypothyroidism 07/25/2016  . Essential hypertension 07/25/2016  . Hyperlipidemia 07/25/2016  . Dysthymic disorder 07/25/2016    Past Surgical History:  Procedure Laterality Date  . BUNIONECTOMY    . CATARACT EXTRACTION  2016  . fusion   2015   first MTP B  . MYOMECTOMY  1992  . ORIF FINGER FRACTURE  2006  . THYROIDECTOMY  2005  . TONSILLECTOMY  1960    OB History    No data available       Home Medications    Prior to Admission medications   Medication Sig Start Date End Date Taking? Authorizing Provider  acetaminophen (TYLENOL) 500 MG tablet Take 1,000 mg by mouth every 6 (six) hours as needed for mild pain or headache.      Historical Provider, MD  atenolol (TENORMIN) 25 MG tablet Take 1 tablet (25 mg total) by mouth daily. 07/25/16   Alycia Rossetti, MD  atorvastatin (LIPITOR) 20 MG tablet Take 1 tablet (20 mg total) by mouth daily. 10/10/16   Alycia Rossetti, MD  cephALEXin (KEFLEX) 500 MG capsule Take 1 capsule (500 mg total) by mouth 4 (four) times daily. 12/13/16   Milton Ferguson, MD  cetirizine (ZYRTEC) 10 MG chewable tablet Chew 10 mg by mouth daily as needed for allergies.     Historical Provider, MD  Cholecalciferol (VITAMIN D) 2000 units CAPS Take by mouth.    Historical Provider, MD  doxylamine, Sleep, (UNISOM) 25 MG tablet Take 12.5 mg by mouth at bedtime as needed.    Historical Provider, MD  DULoxetine 40 MG CPEP Take 40 mg by mouth daily. 10/10/16   Alycia Rossetti, MD  HYDROcodone-acetaminophen (NORCO/VICODIN) 5-325 MG tablet Take 1 tablet by mouth every 6 (six) hours as needed for moderate pain. 12/13/16   Milton Ferguson, MD  levothyroxine (SYNTHROID, LEVOTHROID) 125 MCG tablet Take 1 tablet (125 mcg total) by mouth daily before breakfast. 07/25/16   Alycia Rossetti, MD  naproxen sodium (ANAPROX) 220 MG tablet Take 440 mg by mouth 2 (two) times daily as needed.  Historical Provider, MD  ondansetron (ZOFRAN ODT) 4 MG disintegrating tablet 4mg  ODT q4 hours prn nausea/vomit 12/13/16   Milton Ferguson, MD  pantoprazole (PROTONIX) 40 MG tablet Take 1 tablet (40 mg total) by mouth daily. 10/10/16   Alycia Rossetti, MD  Tretinoin, Facial Wrinkles, (RENOVA) 0.02 % CREA Apply daily to face 10/10/16   Alycia Rossetti, MD    Family History Family History  Problem Relation Age of Onset  . Arthritis Mother   . Depression Mother   . COPD Father   . Hearing loss Father   . Heart disease Father   . Hyperlipidemia Father   . Hypertension Father   . Kidney disease Father   . Alcohol abuse Father   . Depression Sister   . Alcohol abuse Sister   . Heart disease Maternal Grandmother     CHF  . COPD Maternal  Grandfather   . Alcohol abuse Paternal Grandmother   . Depression Sister   . Heart disease Sister   . Hyperlipidemia Sister   . Depression Sister   . Heart disease Sister   . Hyperlipidemia Sister     Social History Social History  Substance Use Topics  . Smoking status: Current Every Day Smoker    Types: Pipe, Cigars  . Smokeless tobacco: Never Used  . Alcohol use Yes     Comment: occ.     Allergies   Vioxx [rofecoxib] and Sulfa antibiotics   Review of Systems Review of Systems ROS: Statement: All systems negative except as marked or noted in the HPI; Constitutional: +fever and chills, generalized weakness, generalized body aches. ; ; Eyes: Negative for eye pain, redness and discharge. ; ; ENMT: Negative for ear pain, hoarseness, nasal congestion, sinus pressure and sore throat. ; ; Cardiovascular: Negative for chest pain, palpitations, diaphoresis, dyspnea and peripheral edema. ; ; Respiratory: +cough. Negative for wheezing and stridor. ; ; Gastrointestinal: +nausea. Negative for vomiting, diarrhea, abdominal pain, blood in stool, hematemesis, jaundice and rectal bleeding. . ; ; Genitourinary: Negative for dysuria, flank pain and hematuria. ; ; Musculoskeletal: Negative for back pain and neck pain. Negative for swelling and trauma.; ; Skin: Negative for pruritus, rash, abrasions, blisters, bruising and skin lesion.; ; Neuro: Negative for headache, lightheadedness and neck stiffness. Negative for altered level of consciousness, altered mental status, extremity weakness, paresthesias, involuntary movement, seizure and syncope.       Physical Exam Updated Vital Signs BP 137/67 (BP Location: Right Arm)   Pulse 91   Temp (!) 103.2 F (39.6 C) (Oral)   Resp 18   Ht 5\' 7"  (1.702 m)   Wt 170 lb (77.1 kg)   SpO2 100%   BMI 26.63 kg/m    Patient Vitals for the past 24 hrs:  BP Temp Temp src Pulse Resp SpO2 Height Weight  12/13/16 1836 137/67 (!) 103.2 F (39.6 C) Oral 91 18  100 % 5\' 7"  (1.702 m) 170 lb (77.1 kg)      Physical Exam 1905: Physical examination:  Nursing notes reviewed; Vital signs and O2 SAT reviewed;  Constitutional: Well developed, Well nourished, In no acute distress; Head:  Normocephalic, atraumatic; Eyes: EOMI, PERRL, No scleral icterus; ENMT: Mouth and pharynx normal, Mucous membranes dry; Neck: Supple, Full range of motion, No lymphadenopathy; Cardiovascular: Regular rate and rhythm, No gallop; Respiratory: Breath sounds clear & equal bilaterally, No wheezes.  Speaking full sentences with ease, Normal respiratory effort/excursion; Chest: Nontender, Movement normal; Abdomen: Soft, Nontender, Nondistended, Normal bowel sounds;; Extremities:  Pulses normal, No tenderness, No edema, No calf edema or asymmetry.; Neuro: AA&Ox3, Major CN grossly intact.  Speech clear. No gross focal motor or sensory deficits in extremities.; Skin: Color normal, Warm, Dry.; Psych:  Full affect.    ED Treatments / Results  Labs (all labs ordered are listed, but only abnormal results are displayed)   EKG  EKG Interpretation None       Radiology   Procedures Procedures (including critical care time)  Medications Ordered in ED Medications - No data to display   Initial Impression / Assessment and Plan / ED Course  I have reviewed the triage vital signs and the nursing notes.  Pertinent labs & imaging results that were available during my care of the patient were reviewed by me and considered in my medical decision making (see chart for details).  MDM Reviewed: previous chart, nursing note and vitals Reviewed previous: labs and x-ray Interpretation: labs   Results for orders placed or performed during the hospital encounter of 12/13/16  Comprehensive metabolic panel  Result Value Ref Range   Sodium 136 135 - 145 mmol/L   Potassium 3.4 (L) 3.5 - 5.1 mmol/L   Chloride 101 101 - 111 mmol/L   CO2 27 22 - 32 mmol/L   Glucose, Bld 144 (H) 65 - 99  mg/dL   BUN 10 6 - 20 mg/dL   Creatinine, Ser 1.03 (H) 0.44 - 1.00 mg/dL   Calcium 8.7 (L) 8.9 - 10.3 mg/dL   Total Protein 6.1 (L) 6.5 - 8.1 g/dL   Albumin 3.2 (L) 3.5 - 5.0 g/dL   AST 25 15 - 41 U/L   ALT 18 14 - 54 U/L   Alkaline Phosphatase 77 38 - 126 U/L   Total Bilirubin 0.6 0.3 - 1.2 mg/dL   GFR calc non Af Amer 57 (L) >60 mL/min   GFR calc Af Amer >60 >60 mL/min   Anion gap 8 5 - 15  CBC  Result Value Ref Range   WBC 10.7 (H) 4.0 - 10.5 K/uL   RBC 3.76 (L) 3.87 - 5.11 MIL/uL   Hemoglobin 11.7 (L) 12.0 - 15.0 g/dL   HCT 34.5 (L) 36.0 - 46.0 %   MCV 91.8 78.0 - 100.0 fL   MCH 31.1 26.0 - 34.0 pg   MCHC 33.9 30.0 - 36.0 g/dL   RDW 13.5 11.5 - 15.5 %   Platelets 211 150 - 400 K/uL  CBC with Differential/Platelet  Result Value Ref Range   WBC DUPLICATE 4.0 - 95.6 K/uL   RBC DUPLICATE 3.87 - 5.64 MIL/uL   Hemoglobin DUPLICATE 33.2 - 95.1 g/dL   HCT DUPLICATE 88.4 - 16.6 %   MCV DUPLICATE 06.3 - 016.0 fL   MCH DUPLICATE 10.9 - 32.3 pg   MCHC DUPLICATE 55.7 - 32.2 g/dL   RDW DUPLICATE 02.5 - 42.7 %   Platelets DUPLICATE 062 - 376 K/uL   Neutrophils Relative % 87 %   Neutro Abs 9.2 (H) 1.7 - 7.7 K/uL   Lymphocytes Relative 5 %   Lymphs Abs 0.5 (L) 0.7 - 4.0 K/uL   Monocytes Relative 7 %   Monocytes Absolute 0.7 0.1 - 1.0 K/uL   Eosinophils Relative 1 %   Eosinophils Absolute 0.1 0.0 - 0.7 K/uL   Basophils Relative 0 %   Basophils Absolute 0.0 0.0 - 0.1 K/uL  Influenza panel by PCR (type A & B)  Result Value Ref Range   Influenza A By PCR NEGATIVE NEGATIVE  Influenza B By PCR NEGATIVE NEGATIVE  Urinalysis, Routine w reflex microscopic  Result Value Ref Range   Color, Urine YELLOW YELLOW   APPearance CLOUDY (A) CLEAR   Specific Gravity, Urine 1.024 1.005 - 1.030   pH 5.0 5.0 - 8.0   Glucose, UA NEGATIVE NEGATIVE mg/dL   Hgb urine dipstick SMALL (A) NEGATIVE   Bilirubin Urine NEGATIVE NEGATIVE   Ketones, ur NEGATIVE NEGATIVE mg/dL   Protein, ur 100 (A)  NEGATIVE mg/dL   Nitrite NEGATIVE NEGATIVE   Leukocytes, UA SMALL (A) NEGATIVE   RBC / HPF 6-30 0 - 5 RBC/hpf   WBC, UA TOO NUMEROUS TO COUNT 0 - 5 WBC/hpf   Bacteria, UA NONE SEEN NONE SEEN   Squamous Epithelial / LPF 0-5 (A) NONE SEEN   WBC Clumps PRESENT    Mucous PRESENT    Dg Chest 2 View Result Date: 12/13/2016 CLINICAL DATA:  Cough EXAM: CHEST  2 VIEW COMPARISON:  None. FINDINGS: There is no edema or consolidation. The heart size and pulmonary vascularity are normal. No adenopathy. There is mild degenerative change in the thoracic spine. IMPRESSION: No edema or consolidation. Electronically Signed   By: Lowella Grip III M.D.   On: 12/13/2016 09:45   Results for orders placed or performed during the hospital encounter of 71/69/67  Basic metabolic panel  Result Value Ref Range   Sodium 134 (L) 135 - 145 mmol/L   Potassium 3.3 (L) 3.5 - 5.1 mmol/L   Chloride 101 101 - 111 mmol/L   CO2 27 22 - 32 mmol/L   Glucose, Bld 149 (H) 65 - 99 mg/dL   BUN 9 6 - 20 mg/dL   Creatinine, Ser 0.99 0.44 - 1.00 mg/dL   Calcium 7.6 (L) 8.9 - 10.3 mg/dL   GFR calc non Af Amer 60 (L) >60 mL/min   GFR calc Af Amer >60 >60 mL/min   Anion gap 6 5 - 15  CBC with Differential  Result Value Ref Range   WBC 11.4 (H) 4.0 - 10.5 K/uL   RBC 3.39 (L) 3.87 - 5.11 MIL/uL   Hemoglobin 10.6 (L) 12.0 - 15.0 g/dL   HCT 31.9 (L) 36.0 - 46.0 %   MCV 94.1 78.0 - 100.0 fL   MCH 31.3 26.0 - 34.0 pg   MCHC 33.2 30.0 - 36.0 g/dL   RDW 13.9 11.5 - 15.5 %   Platelets 235 150 - 400 K/uL   Neutrophils Relative % 80 %   Neutro Abs 9.3 (H) 1.7 - 7.7 K/uL   Lymphocytes Relative 8 %   Lymphs Abs 0.9 0.7 - 4.0 K/uL   Monocytes Relative 11 %   Monocytes Absolute 1.2 (H) 0.1 - 1.0 K/uL   Eosinophils Relative 1 %   Eosinophils Absolute 0.1 0.0 - 0.7 K/uL   Basophils Relative 0 %   Basophils Absolute 0.0 0.0 - 0.1 K/uL  Lactic acid, plasma  Result Value Ref Range   Lactic Acid, Venous 1.8 0.5 - 1.9 mmol/L     2040:  IVF given. Pt already given dose of IV rocephin earlier today. Pt took APAP PTA. Will observation admit. T/C to [facility]. [place] Comment, case discussed, including:  HPI, pertinent PM/SHx, VS/PE, dx testing, ED course and treatment:  Agreeable to admit, he will come to the ED for evaluation.    Final Clinical Impressions(s) / ED Diagnoses   Final diagnoses:  None    New Prescriptions New Prescriptions   No medications on file  Francine Graven, DO 12/14/16 [phone]

## 2016-12-13 NOTE — ED Triage Notes (Signed)
Pt was here this morning for a fever and dx with pyelonephritis. 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 Pt continues to have fevers that are not manageable. Pt states she feels dehydrated (she had two bags of saline this morning) and believes she could be becoming septic. Pt fever 103.2 in triage. Last took tylenol at 1800.

## 2016-12-13 NOTE — ED Triage Notes (Signed)
Pt comes in for body aches, fever, nausea, and vomiting starting Monday. Pt has a fever of 103.1 (104.4 at home) Pt states she is unable to keep any fluids down. 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

## 2016-12-14 DIAGNOSIS — B349 Viral infection, unspecified: Principal | ICD-10-CM

## 2016-12-14 LAB — BASIC METABOLIC PANEL
Anion gap: 5 (ref 5–15)
BUN: 6 mg/dL (ref 6–20)
CALCIUM: 7.3 mg/dL — AB (ref 8.9–10.3)
CO2: 29 mmol/L (ref 22–32)
CREATININE: 1.01 mg/dL — AB (ref 0.44–1.00)
Chloride: 104 mmol/L (ref 101–111)
GFR calc Af Amer: >60 mL/min (ref >60)
GFR calc non Af Amer: 58 mL/min — ABNORMAL LOW (ref >60)
GLUCOSE: 117 mg/dL — AB (ref 65–99)
Potassium: 3.2 mmol/L — ABNORMAL LOW (ref 3.5–5.1)
Sodium: 138 mmol/L (ref 135–145)

## 2016-12-14 LAB — CBC
HCT: 30.4 % — ABNORMAL LOW (ref 36.0–46.0)
HEMOGLOBIN: 10.1 g/dL — AB (ref 12.0–15.0)
MCH: 31.2 pg (ref 26.0–34.0)
MCHC: 33.2 g/dL (ref 30.0–36.0)
MCV: 93.8 fL (ref 78.0–100.0)
PLATELETS: 212 10*3/uL (ref 150–400)
RBC: 3.24 MIL/uL — ABNORMAL LOW (ref 3.87–5.11)
RDW: 13.8 % (ref 11.5–15.5)
WBC: 10.5 10*3/uL (ref 4.0–10.5)

## 2016-12-14 MED ORDER — LEVOTHYROXINE SODIUM 137 MCG PO TABS: 125.0000 ug | ORAL_TABLET | Freq: Every day | ORAL | 3 refills | Status: DC

## 2016-12-14 MED ORDER — DIPHENHYDRAMINE HCL 12.5 MG/5ML PO ELIX
12.5000 mg | ORAL_SOLUTION | Freq: Every evening | ORAL | Status: DC | PRN
Start: 2016-12-14 — End: 2016-12-14

## 2016-12-14 NOTE — Progress Notes (Signed)
Patient is to be discharged home and in stable condition. Patient and family member present for discharge teaching and verbalize understanding of medication regimen and the need for follow-up appointments. Patient has been given prescriptions, MD note and discharge instructions and has no further questions at this time. Patient will be escorted out by staff.  Celestia Khat, RN

## 2016-12-14 NOTE — Discharge Summary (Signed)
Physician Discharge Summary  Destiny Graham JJK:[id number] DOB: 09/16/1955 DOA: 12/13/2016  PCP: Vic Blackbird, MD  Admit date: 12/13/2016 Discharge date: 12/14/2016  Time spent: 45 minutes  Recommendations for Outpatient Follow-up:  -Will be discharged home today. -Advised to take remaining keflex unless urine cx is negative, at which point she may discontinue use. -Advised to follow up with PCP in 1-2 weeks.   Discharge Diagnoses:  Principal Problem:   Acute viral syndrome Active Problems:   Thyroid disease   Hypertension   Acute pyelonephritis   Pyelonephritis   Discharge Condition: Stable and improved  Filed Weights   12/13/16 1836  Weight: 77.1 kg (170 lb)    History of present illness:  As per Dr. 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  She was seen early in the ER, found to have negative Flu test, and UA with TNTC WBCs.  She was given IV Rocephin, and discharged to home, but she has been weak and had difficult getting around.  She has no flank pain, abdominal pain, SOB, or chest pain.  Evaluation showed no leukocytosis, with normal renal fx tests, and K of 3.3 mE/L.  She was hypotensive, but responded to IVF.  Her lactic acid was negative.  Hospitalist was asked to admit her for further evaluation.    Hospital Course:   Viral Syndrome -This is the likely cause of her symptoms. -Flu PCR negative. Does not need tamiflu. -Advised symptomatic management: fluids, rests OTC pain meds if needed.  ?UTI -Pyuria, urine cx pending. Has no urinary symptoms. -Given high temps on admission, have advised she continue to take keflex that was prescribed to her in the ED, unless her urine cx is negative at which point she may discontinue use of the abx.  Hypothyroidism -TSH elevated. -Advised  an increase in synthroid dose to 137 mcg and to follow up with PCP in 4-6 weeks for repeat thyroid functions tests.  Procedures:  None   Consultations:  None  Discharge Instructions  Discharge Instructions    Diet - low sodium heart healthy    Complete by:  As directed    Increase activity slowly    Complete by:  As directed      Allergies as of 12/14/2016      Reactions   Vioxx [rofecoxib]    Photodermatitis   Sulfa Antibiotics Rash      Medication List    STOP taking these medications   naproxen sodium 220 MG tablet Commonly known as:  ANAPROX     TAKE these medications   acetaminophen 500 MG tablet Commonly known as:  TYLENOL Take 1,000 mg by mouth every 6 (six) hours as needed for mild pain or headache.   atenolol 25 MG tablet Commonly known as:  TENORMIN Take 1 tablet (25 mg total) by mouth daily.   atorvastatin 20 MG tablet Commonly known as:  LIPITOR Take 1 tablet (20 mg total) by mouth daily.   cephALEXin 500 MG capsule Commonly known as:  KEFLEX Take 1 capsule (500 mg total) by mouth 4 (four) times daily.   cetirizine 10 MG chewable tablet Commonly known as:  ZYRTEC Chew 10 mg by mouth daily as needed for allergies.   doxylamine (Sleep) 25 MG tablet Commonly known as:  UNISOM Take 12.5 mg by mouth at bedtime  as needed.   DULoxetine HCl 40 MG Cpep Take 40 mg by mouth daily.   HYDROcodone-acetaminophen 5-325 MG tablet Commonly known as:  NORCO/VICODIN Take 1 tablet by mouth every 6 (six) hours as needed for moderate pain.   levothyroxine 137 MCG tablet Commonly known as:  SYNTHROID, LEVOTHROID Take 1 tablet (137 mcg total) by mouth daily before breakfast. Start taking on:  12/15/2016 What changed:  medication strength  how much to take   ondansetron 4 MG disintegrating tablet Commonly known as:  ZOFRAN ODT 4mg  ODT q4 hours prn nausea/vomit   pantoprazole 40 MG tablet Commonly known as:  PROTONIX Take 1 tablet (40 mg total) by mouth  daily.   Tretinoin (Facial Wrinkles) 0.02 % Crea Commonly known as:  RENOVA Apply daily to face   Vitamin D 2000 units Caps Take by mouth.      Allergies  Allergen Reactions  . Vioxx [Rofecoxib]     Photodermatitis   . Sulfa Antibiotics Rash   Follow-up Information    Vic Blackbird, MD Follow up in 2 week(s).   Specialty:  Family Medicine Contact information: [address] [place] [address] [phone]            The results of significant diagnostics from this hospitalization (including imaging, microbiology, ancillary and laboratory) are listed below for reference.    Significant Diagnostic Studies: Dg Chest 2 View  Result Date: 12/13/2016 CLINICAL DATA:  Cough EXAM: CHEST  2 VIEW COMPARISON:  None. FINDINGS: There is no edema or consolidation. The heart size and pulmonary vascularity are normal. No adenopathy. There is mild degenerative change in the thoracic spine. IMPRESSION: No edema or consolidation. Electronically Signed   By: Lowella Grip III M.D.   On: 12/13/2016 09:45    Microbiology: Recent Results (from the past 240 hour(s))  Culture, blood (routine x 2)     Status: None (Preliminary result)   Collection Time: 12/13/16  8:42 PM  Result Value Ref Range Status   Specimen Description RIGHT ANTECUBITAL  Final   Special Requests   Final    BOTTLES DRAWN AEROBIC AND ANAEROBIC Blood Culture adequate volume   Culture NO GROWTH < 12 HOURS  Final   Report Status PENDING  Incomplete  Culture, blood (routine x 2)     Status: None (Preliminary result)   Collection Time: 12/13/16  8:42 PM  Result Value Ref Range Status   Specimen Description BLOOD RIGHT HAND  Final   Special Requests   Final    BOTTLES DRAWN AEROBIC AND ANAEROBIC Blood Culture results may not be optimal due to an inadequate volume of blood received in culture bottles   Culture NO GROWTH < 12 HOURS  Final   Report Status PENDING  Incomplete     Labs: Basic Metabolic  Panel:  Recent Labs Lab 12/13/16 0836 12/13/16 1930 12/14/16 0611  NA 136 134* 138  K 3.4* 3.3* 3.2*  CL 101 101 104  CO2 27 27 29   GLUCOSE 144* 149* 117*  BUN 10 9 6   CREATININE 1.03* 0.99 1.01*  CALCIUM 8.7* 7.6* 7.3*   Liver Function Tests:  Recent Labs Lab 12/13/16 0836  AST 25  ALT 18  ALKPHOS 77  BILITOT 0.6  PROT 6.1*  ALBUMIN 3.2*   No results for input(s): LIPASE, AMYLASE in the last 168 hours. No results for input(s): AMMONIA in the last 168 hours. CBC:  Recent Labs Lab 12/13/16 0836 12/13/16 1930 91/47/82 9562  WBC DUPLICATE  10.7* 11.4* 10.5  NEUTROABS 9.2* 9.3*  --   HGB DUPLICATE  61.9* 01.2* 22.4*  HCT DUPLICATE  11.4* 64.3* 14.2*  MCV DUPLICATE  76.7 01.1 00.3  PLT DUPLICATE  496 116 435   Cardiac Enzymes: No results for input(s): CKTOTAL, CKMB, CKMBINDEX, TROPONINI in the last 168 hours. BNP: BNP (last 3 results) No results for input(s): BNP in the last 8760 hours.  ProBNP (last 3 results) No results for input(s): PROBNP in the last 8760 hours.  CBG: No results for input(s): GLUCAP in the last 168 hours.     SignedLelon Frohlich  [facility] Pager: [phone] 12/14/2016, 3:54 PM

## 2016-12-15 ENCOUNTER — Other Ambulatory Visit: Payer: Self-pay | Admitting: *Deleted

## 2016-12-15 ENCOUNTER — Encounter: Payer: Self-pay | Admitting: *Deleted

## 2016-12-15 ENCOUNTER — Telehealth: Payer: Self-pay | Admitting: Family Medicine

## 2016-12-15 LAB — HIV ANTIBODY (ROUTINE TESTING W REFLEX): HIV SCREEN 4TH GENERATION: NONREACTIVE

## 2016-12-15 MED ORDER — PROMETHAZINE HCL 25 MG PO TABS: 25.0000 mg | ORAL_TABLET | Freq: Four times a day (QID) | ORAL | 0 refills | Status: DC | PRN

## 2016-12-15 MED ORDER — BENZONATATE 200 MG PO CAPS: 200.0000 mg | ORAL_CAPSULE | Freq: Three times a day (TID) | ORAL | 0 refills | Status: DC | PRN

## 2016-12-15 NOTE — Telephone Encounter (Signed)
okwith phenergan 25 poq6 hrs prn nausea and tessalon 200 mg poq 8 hrs prn cough.(30 each)

## 2016-12-15 NOTE — Telephone Encounter (Signed)
Just discharged from hosp yesterday.  Has follow up with Dr Buelah Manis next Friday.  Says cough medicine and nausea medicine not working.  Would like for [place] to call in Tessalon and Phenergan for her.

## 2016-12-15 NOTE — Patient Outreach (Addendum)
[facility]) Care Management  12/15/2016  Destiny Graham 1955-09-27 [id number]   Subjective: Telephone call to patient's home number, spoke with patient, and HIPAA verified.  Patient states her preferred name is Destiny Graham.  Discussed [facility], [facility], [facility] Care Management [facility] Transition of care follow up, patient voiced understanding, and is in agreement to complete follow up. States she is doing much better, temp is below 100 today, went home from hospital with fever of 102.9, and was not aware of it until she arrived home.  States she treated the fever with Tylenol, states she realizes that she should of ask the discharging nurse to take her temperature prior to discharge and will report this to the hospital during follow survey.  States she has made a follow up appointment with primary MD for 12/22/16 and is aware of symptoms to notify MD of prior to appointment if needed.   Patient states she has questions regarding the ED copayment of $300 since she was admitted, if the $300 will be refunded, or put toward her hospital bill.   RNCM advised patient to follow [facility] regarding her the ED copayment, patient voiced understanding, and states she will follow up.  States she does not need family medical leave act [facility]) at this time, has enough PAL (paid annual leave) to cover time off from work, and she does not have the hospital indemnity supplemental insurance. Patient states she does not have any transition of care, care coordination, disease management, disease monitoring, transportation, community resource, or pharmacy needs at this time.  States she is very appreciative of the follow up call and is in agreement to receive [facility] information.    Objective: Per chart review, patient hospitalized 12/13/16 - 12/14/16 for Acute viral syndrome.    Patient also has a history of hypertension.    Assessment: Received [facility] Transition of care referral on 12/15/16 via [employer] report.   Transition of care  follow up completed, no care management needs, and will proceed with case closure.   Plan: RNCM will send patient successful outreach letter, [facility] pamphlet, and magnet. RNCM will send case closure due to follow up completed / no care management needs request to [facility]. Destiny Graham, BSN, [place] [facility] Telephonic CM Phone: [phone] Fax: [phone]

## 2016-12-15 NOTE — Telephone Encounter (Signed)
I left pt message that Rx's sent to pharmacy

## 2016-12-16 LAB — URINE CULTURE: Culture: 70000 — AB

## 2016-12-17 ENCOUNTER — Telehealth: Payer: Self-pay

## 2016-12-17 NOTE — Telephone Encounter (Signed)
Post ED Visit - Positive Culture Follow-up  Culture report reviewed by antimicrobial stewardship pharmacist:  []  Elenor Quinones, Pharm.D. []  Heide Guile, Pharm.D., BCPS AQ-ID []  Parks Neptune, Pharm.D., BCPS []  Alycia Rossetti, Pharm.D., BCPS []  [place], Pharm.D., BCPS, AAHIVP []  Legrand Como, Pharm.D., BCPS, AAHIVP []  Salome Arnt, PharmD, BCPS []  Dimitri Ped, PharmD, BCPS []  Vincenza Hews, PharmD, BCPS [facility] Pharm D Positive urine culture Treated with Cephalexin  organism sensitive to the same and no further patient follow-up is required at this time.  Genia Del 12/17/2016, 9:57 AM

## 2016-12-18 ENCOUNTER — Telehealth: Payer: Self-pay | Admitting: Family Medicine

## 2016-12-18 ENCOUNTER — Other Ambulatory Visit: Payer: Self-pay

## 2016-12-18 LAB — CULTURE, BLOOD (ROUTINE X 2)
Culture: NO GROWTH
Culture: NO GROWTH
Special Requests: ADEQUATE

## 2016-12-18 NOTE — Telephone Encounter (Signed)
Pt calling for lab results.

## 2016-12-18 NOTE — Telephone Encounter (Signed)
Spoke with pt, E coli sensitive to keflex  Will complete antibiotics Will not change the TSH as acutely ill F/U Friday as scheduled

## 2016-12-18 NOTE — Telephone Encounter (Signed)
Call placed to patient to inquire.   States that she had labs obtained while in hospital and is calling for results of culture. States that she would like to know today if she should stop or change ABTx instead of waiting until F/U on Friday.  MD please advise.

## 2016-12-22 ENCOUNTER — Encounter: Payer: Self-pay | Admitting: Family Medicine

## 2016-12-22 ENCOUNTER — Ambulatory Visit (INDEPENDENT_AMBULATORY_CARE_PROVIDER_SITE_OTHER): Payer: 59 | Admitting: Family Medicine

## 2016-12-22 VITALS — BP 136/72 | HR 78 | Temp 98.2°F | Resp 14 | Ht 67.0 in | Wt 178.0 lb

## 2016-12-22 DIAGNOSIS — B9789 Other viral agents as the cause of diseases classified elsewhere: Secondary | ICD-10-CM | POA: Diagnosis not present

## 2016-12-22 DIAGNOSIS — N390 Urinary tract infection, site not specified: Secondary | ICD-10-CM

## 2016-12-22 DIAGNOSIS — E890 Postprocedural hypothyroidism: Secondary | ICD-10-CM | POA: Diagnosis not present

## 2016-12-22 DIAGNOSIS — B9620 Unspecified Escherichia coli [E. coli] as the cause of diseases classified elsewhere: Secondary | ICD-10-CM

## 2016-12-22 DIAGNOSIS — E876 Hypokalemia: Secondary | ICD-10-CM | POA: Diagnosis not present

## 2016-12-22 DIAGNOSIS — J988 Other specified respiratory disorders: Secondary | ICD-10-CM

## 2016-12-22 LAB — URINALYSIS, ROUTINE W REFLEX MICROSCOPIC
Bilirubin Urine: NEGATIVE
GLUCOSE, UA: NEGATIVE
HGB URINE DIPSTICK: NEGATIVE
KETONES UR: NEGATIVE
LEUKOCYTES UA: NEGATIVE
Nitrite: NEGATIVE
PH: 5.5 (ref 5.0–8.0)
Protein, ur: NEGATIVE
SPECIFIC GRAVITY, URINE: 1.01 (ref 1.001–1.035)

## 2016-12-22 MED ORDER — DULOXETINE HCL 40 MG PO CPEP: 40.0000 mg | ORAL_CAPSULE | Freq: Every day | ORAL | 3 refills | Status: DC

## 2016-12-22 NOTE — Progress Notes (Signed)
   Subjective:    Patient ID: Destiny Graham, female    DOB: 1955/10/07, 61 y.o.   MRN: [id number]  Patient presents for Hospital F/U Here for hospital follow-up. She was noted overnight for monitoring after she presented with significant fatigue fever /chills/cough going on for a few days. She also had nausea and vomiting. She had negative flu test was found to have urinary tract infection. She was given IV Rocephin in the emergency room and then admitted for further treatment. Her labs were fairly unremarkable she had normal renal function had a mildly low potassium at 3.3 she did have some hypotension which didn't respond to IV fluids. Her lactic acid was negative. Without this is a viral syndrome also in setting of UTI which did show 70,000 colonies of Escherichia coli sensitive to Keflex which she has completed.  On review of labs she had dilutional anemia- Hb down to 10.1  WBC at discharge 10.5  K at discharge 3.2    She has history of hypothyroidism recent TSH done which was normal she did have elevated TSH in the setting of her acute illness discharging physician recommended going up on her levothyroxine however I discussed results with her few days ago would rather wait and see if her TSH returns normal and she was acutely ill.  Today she is improved and back at work. She has mild fatigue. Note she never had UTI symptoms.    Review Of Systems:  GEN- + mild fatigue, fever, weight loss,weakness, recent illness HEENT- denies eye drainage, change in vision, nasal discharge, CVS- denies chest pain, palpitations RESP- denies SOB, cough, wheeze ABD- denies N/V, change in stools, abd pain GU- denies dysuria, hematuria, dribbling, incontinence MSK- denies joint pain, muscle aches, injury Neuro- denies headache, dizziness, syncope, seizure activity       Objective:    BP 136/72   Pulse 78   Temp 98.2 F (36.8 C) (Oral)   Resp 14   Ht 5\' 7"  (1.702 m)   Wt 178 lb (80.7 kg)   SpO2 98%    BMI 27.88 kg/m  GEN- NAD, alert and oriented x3 HEENT- PERRL, EOMI, non injected sclera, pink conjunctiva, MMM, oropharynx clear Neck- Supple, no thyromegaly CVS- RRR, no murmur RESP-CTAB ABD-NABS,soft,NT,ND, no CVA tenderness  EXT- No edema Pulses- Radial  2+        Assessment & Plan:      Problem List Items Addressed This Visit    Hypothyroidism    Wait 6 weeks, recheck TFT until acute illness has completely cleared       Other Visit Diagnoses    E. coli UTI    -  Primary   Completed Kelfex, recheck UA/Culture   Relevant Orders   Urinalysis, Routine w reflex microscopic (Completed)   Urine culture   CBC with Differential/Platelet   Viral respiratory illness       CXR neg, back at baseline   Hypokalemia       Recheck BMET   Relevant Orders   Basic metabolic panel   TSH   T3, free   T4, free      Note: This dictation was prepared with Dragon dictation along with smaller phrase technology. Any transcriptional errors that result from this process are unintentional.

## 2016-12-22 NOTE — Assessment & Plan Note (Signed)
Wait 6 weeks, recheck TFT until acute illness has completely cleared

## 2016-12-22 NOTE — Patient Instructions (Signed)
F/U as previous 

## 2016-12-23 LAB — BASIC METABOLIC PANEL
BUN: 19 mg/dL (ref 7–25)
CALCIUM: 9.6 mg/dL (ref 8.6–10.4)
CHLORIDE: 100 mmol/L (ref 98–110)
CO2: 30 mmol/L (ref 20–31)
Creat: 1.12 mg/dL — ABNORMAL HIGH (ref 0.50–0.99)
Glucose, Bld: 96 mg/dL (ref 70–99)
POTASSIUM: 4.6 mmol/L (ref 3.5–5.3)
SODIUM: 138 mmol/L (ref 135–146)

## 2016-12-23 LAB — CBC WITH DIFFERENTIAL/PLATELET
BASOS ABS: 78 {cells}/uL (ref 0–200)
Basophils Relative: 1 %
EOS ABS: 234 {cells}/uL (ref 15–500)
EOS PCT: 3 %
HEMATOCRIT: 39.1 % (ref 35.0–45.0)
HEMOGLOBIN: 12.6 g/dL (ref 12.0–15.0)
LYMPHS ABS: 2730 {cells}/uL (ref 850–3900)
Lymphocytes Relative: 35 %
MCH: 29.9 pg (ref 27.0–33.0)
MCHC: 32.2 g/dL (ref 32.0–36.0)
MCV: 92.7 fL (ref 80.0–100.0)
MPV: 9 fL (ref 7.5–12.5)
Monocytes Absolute: 702 cells/uL (ref 200–950)
Monocytes Relative: 9 %
NEUTROS ABS: 4056 {cells}/uL (ref 1500–7800)
NEUTROS PCT: 52 %
Platelets: 556 10*3/uL — ABNORMAL HIGH (ref 140–400)
RBC: 4.22 MIL/uL (ref 3.80–5.10)
RDW: 14.5 % (ref 11.0–15.0)
WBC: 7.8 10*3/uL (ref 3.8–10.8)

## 2016-12-24 LAB — URINE CULTURE: ORGANISM ID, BACTERIA: NO GROWTH

## 2016-12-26 ENCOUNTER — Telehealth: Payer: Self-pay | Admitting: *Deleted

## 2016-12-26 NOTE — Telephone Encounter (Signed)
Received request from pharmacy for PA on Duloxetine.   PA submitted.   Dx: F41.8- situational anxiety.

## 2016-12-28 NOTE — Telephone Encounter (Signed)
Received fax from [employer].   States that first name does not match records.   Patient has nickname "Collie Siad" on insurance card.

## 2017-01-02 MED ORDER — DULOXETINE HCL 20 MG PO CPEP: 40.0000 mg | ORAL_CAPSULE | Freq: Every day | ORAL | 1 refills | Status: DC

## 2017-01-02 NOTE — Telephone Encounter (Signed)
Received PA determination.   PA denied.   Formulary alternative includes duloxetine 20mg  (2) caps PO QD.   MD please advise.

## 2017-01-02 NOTE — Telephone Encounter (Signed)
Okay, let pt know that insurance will only cover  2 of the 20mg  caps, send 90 day supply

## 2017-02-07 DIAGNOSIS — M79671 Pain in right foot: Secondary | ICD-10-CM | POA: Diagnosis not present

## 2017-02-07 DIAGNOSIS — M722 Plantar fascial fibromatosis: Secondary | ICD-10-CM | POA: Diagnosis not present

## 2017-02-16 ENCOUNTER — Other Ambulatory Visit: Payer: Self-pay

## 2017-02-16 MED ORDER — ATENOLOL 25 MG PO TABS: 25.0000 mg | ORAL_TABLET | Freq: Every day | ORAL | 0 refills | Status: DC

## 2017-02-19 ENCOUNTER — Other Ambulatory Visit: Payer: Self-pay | Admitting: Family Medicine

## 2017-03-16 ENCOUNTER — Other Ambulatory Visit: Payer: Self-pay | Admitting: *Deleted

## 2017-03-16 MED ORDER — LEVOTHYROXINE SODIUM 137 MCG PO TABS: 125.0000 ug | ORAL_TABLET | Freq: Every day | ORAL | 0 refills | Status: DC

## 2017-03-21 ENCOUNTER — Other Ambulatory Visit: Payer: Self-pay | Admitting: *Deleted

## 2017-03-21 MED ORDER — LEVOTHYROXINE SODIUM 125 MCG PO TABS: 125.0000 ug | ORAL_TABLET | Freq: Every day | ORAL | 3 refills | Status: DC

## 2017-04-11 ENCOUNTER — Ambulatory Visit (INDEPENDENT_AMBULATORY_CARE_PROVIDER_SITE_OTHER): Payer: 59 | Admitting: Family Medicine

## 2017-04-11 ENCOUNTER — Encounter: Payer: Self-pay | Admitting: Family Medicine

## 2017-04-11 VITALS — BP 128/82 | HR 66 | Temp 99.2°F | Wt 157.0 lb

## 2017-04-11 DIAGNOSIS — F341 Dysthymic disorder: Secondary | ICD-10-CM | POA: Diagnosis not present

## 2017-04-11 DIAGNOSIS — E7800 Pure hypercholesterolemia, unspecified: Secondary | ICD-10-CM

## 2017-04-11 DIAGNOSIS — E890 Postprocedural hypothyroidism: Secondary | ICD-10-CM | POA: Diagnosis not present

## 2017-04-11 DIAGNOSIS — I10 Essential (primary) hypertension: Secondary | ICD-10-CM | POA: Diagnosis not present

## 2017-04-11 MED ORDER — DULOXETINE HCL 60 MG PO CPEP: 60.0000 mg | ORAL_CAPSULE | Freq: Every day | ORAL | 2 refills | Status: DC

## 2017-04-11 NOTE — Assessment & Plan Note (Signed)
Recheck lipids on lipitor has family history of CAD But with weight loss dietary changes may be able to reduce

## 2017-04-11 NOTE — Assessment & Plan Note (Signed)
Repeat TFT in 6-8 weeks, continue 123mcg daily

## 2017-04-11 NOTE — Progress Notes (Signed)
   Subjective:    Patient ID: Destiny Graham, female    DOB: 20-Mar-1956, 61 y.o.   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 She is seeing second opinion   Off of PPI, now using zantac 150mg  BID  HTN- taking atenolol no concerns    Review Of Systems:  GEN- denies fatigue, fever, weight loss,weakness, recent illness HEENT- denies eye drainage, change in vision, nasal discharge, CVS- denies chest pain, palpitations RESP- denies SOB, cough, wheeze ABD- denies N/V, change in stools, abd pain GU- denies dysuria, hematuria, dribbling, incontinence MSK- denies joint pain, muscle aches, injury Neuro- denies headache, dizziness, syncope, seizure activity       Objective:    BP 128/82   Pulse 66   Temp 99.2 F (37.3 C)   Wt 157 lb (71.2 kg)   SpO2 97%   BMI 24.59 kg/m  GEN- NAD, alert and oriented x3 HEENT- PERRL, EOMI, non injected sclera, pink conjunctiva, MMM, oropharynx clear Neck- Supple  CVS- RRR, no murmur RESP-CTAB Psych- normal affect and mood EXT- No edema Pulses- Radial  2+        Assessment & Plan:      Problem List Items Addressed This Visit    Hypothyroidism - Primary    Repeat TFT in 6-8 weeks, continue 159mcg daily      Relevant Orders   TSH   T3, free   T4, free   Hyperlipidemia    Recheck lipids on lipitor has family history of CAD But with weight  loss dietary changes may be able to reduce      Essential hypertension    Controlled on atenolol      Relevant Orders   Comprehensive metabolic panel   Lipid panel   Dysthymic disorder    Increase cymbalta to 60mg  if still little improvement can augment with Wellbutrin Also discussed life coach/counseling  She will let me know if she wants to pursue this F/u via mychart for increase in cymbalta      Relevant Medications   DULoxetine (CYMBALTA) 60 MG capsule      Note: This dictation was prepared with Dragon dictation along with smaller phrase technology. Any transcriptional errors that result from this process are unintentional.

## 2017-04-11 NOTE — Assessment & Plan Note (Signed)
Controlled on atenolol. 

## 2017-04-11 NOTE — Patient Instructions (Addendum)
F/U as needed  Jan 2019

## 2017-04-11 NOTE — Assessment & Plan Note (Signed)
Increase cymbalta to 60mg  if still little improvement can augment with Wellbutrin Also discussed life coach/counseling  She will let me know if she wants to pursue this F/u via mychart for increase in cymbalta

## 2017-04-30 ENCOUNTER — Telehealth: Payer: Self-pay | Admitting: Family Medicine

## 2017-04-30 MED ORDER — DULOXETINE HCL 60 MG PO CPEP: 60.0000 mg | ORAL_CAPSULE | Freq: Every day | ORAL | 0 refills | Status: DC

## 2017-04-30 MED ORDER — DULOXETINE HCL 60 MG PO CPEP: 60.0000 mg | ORAL_CAPSULE | Freq: Every day | ORAL | 2 refills | Status: DC

## 2017-04-30 NOTE — Telephone Encounter (Signed)
Duloxetine never went to pharmacy as it was sent "no print"  Pt is out wants it resent to [place] and please send 4-5 to [facility] while she waits for her delivery to [facility].  Done

## 2017-06-04 ENCOUNTER — Ambulatory Visit (INDEPENDENT_AMBULATORY_CARE_PROVIDER_SITE_OTHER): Payer: 59 | Admitting: Orthopedic Surgery

## 2017-06-04 ENCOUNTER — Encounter (INDEPENDENT_AMBULATORY_CARE_PROVIDER_SITE_OTHER): Payer: Self-pay | Admitting: Orthopedic Surgery

## 2017-06-04 DIAGNOSIS — T85848A Pain due to other internal prosthetic devices, implants and grafts, initial encounter: Secondary | ICD-10-CM | POA: Insufficient documentation

## 2017-06-04 DIAGNOSIS — L600 Ingrowing nail: Secondary | ICD-10-CM | POA: Insufficient documentation

## 2017-06-04 DIAGNOSIS — T85848S Pain due to other internal prosthetic devices, implants and grafts, sequela: Secondary | ICD-10-CM

## 2017-06-04 DIAGNOSIS — M84374A Stress fracture, right foot, initial encounter for fracture: Secondary | ICD-10-CM | POA: Diagnosis not present

## 2017-06-04 NOTE — Progress Notes (Addendum)
Office Visit Note   Patient: Destiny Graham           Date of Birth: [birth year]           MRN: [id number] Visit Date: 06/04/2017              Requested by: Alycia Rossetti, MD [address] [place], [place] [address] PCP: Alycia Rossetti, MD  Chief Complaint  Patient presents with  . 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 She complains of a painful ingrown nail of the right great toe lateral border and complains of some chronic posterior heel pain. She states she does not have pain in the morning it gets worse after she's been on her feet longer. Patient currently has a negative heel sneaker on with a hard orthotic. Patient has tried anti-inflammatories without relief. Patient is seen Dr. Beola Cord and was told she had plantar fasciitis.  Patient is on vitamin D3 and calcium supplements she states that her bone mineral density has been good and checked annually.  Assessment & Plan: Visit Diagnoses:  1. Stress fracture of right calcaneus   2. Ingrowing nail, right great toe   3. Pain from implanted hardware, sequela     Plan: Patient was recommended to get a pair of [place] trail running sneakers. Recommended sole orthotics. With patient's negative heel sneaker and soft forefoot with hallux rigidus she is overloading the hindfoot.  Patient will require removal of the painful deep hardware left great toe and we'll also need excision of the lateral border of the right great toe. Patient will bring her x-rays from [place] to identify the hardware. Patient states she'll call for surgery in October.  Follow-Up Instructions: Return if symptoms worsen or fail to improve.   Ortho Exam  Patient is alert, oriented, no adenopathy, well-dressed, normal affect, normal respiratory effort. Examination patient  is a good dorsalis pedis and posterior tibial pulse. The tarsal tunnel is nontender to palpation the Achilles tendon and plantar fascia nontender to palpation. Patient has good ankle good subtalar motion she has excellent dorsiflexion of the ankle with the knee extended. Patient's only pain is reproduced with lateral compression of the calcaneus consistent with a stress fracture. There is no redness no cellulitis no skin color or temperature changes.  Imaging: No results found. No images are attached to the encounter.  Labs: Lab Results  Component Value Date   REPTSTATUS 12/18/2016 FINAL 12/13/2016   REPTSTATUS 12/18/2016 FINAL 12/13/2016   CULT NO GROWTH 5 DAYS 12/13/2016   CULT NO GROWTH 5 DAYS 12/13/2016   LABORGA NO GROWTH 12/22/2016    Orders:  No orders of the defined types were placed in this encounter.  No orders of the defined types were placed in this encounter.    Procedures: No procedures performed  Clinical Data: No additional findings.  ROS:  All other systems negative, except as noted in the HPI. Review of Systems  Objective: Vital Signs: There were no vitals taken for this visit.  Specialty Comments:  No specialty comments available.  PMFS History: Patient Active Problem List   Diagnosis Date Noted  . OSA on CPAP 10/10/2016  . GERD (gastroesophageal reflux disease) 07/26/2016  . DDD (degenerative disc disease), cervical 07/26/2016  . Generalized OA 07/26/2016  .  Hypothyroidism 07/25/2016  . Essential hypertension 07/25/2016  . Hyperlipidemia 07/25/2016  . Dysthymic disorder 07/25/2016   Past Medical History:  Diagnosis Date  . Breast nodule   . DDD (degenerative disc disease), cervical   . Dysthymia   . Generalized OA   . GERD (gastroesophageal reflux disease)   . Hallux rigidus   . Heart murmur   . Hyperlipidemia   . Hypertension   . OSA on CPAP   . Pseudophakia   . Thyroid disease   . Uterine fibroid     Family History  Problem  Relation Age of Onset  . Arthritis Mother   . Depression Mother   . COPD Father   . Hearing loss Father   . Heart disease Father   . Hyperlipidemia Father   . Hypertension Father   . Kidney disease Father   . Alcohol abuse Father   . Depression Sister   . Alcohol abuse Sister   . Heart disease Maternal Grandmother        CHF  . COPD Maternal Grandfather   . Alcohol abuse Paternal Grandmother   . Depression Sister   . Heart disease Sister   . Hyperlipidemia Sister   . Depression Sister   . Heart disease Sister   . Hyperlipidemia Sister     Past Surgical History:  Procedure Laterality Date  . BUNIONECTOMY    . CATARACT EXTRACTION  2016  . fusion   2015   first MTP B  . MYOMECTOMY  1992  . ORIF FINGER FRACTURE  2006  . THYROIDECTOMY  2005  . TONSILLECTOMY  1960   Social History   Occupational History  . Not on file.   Social History Main Topics  . Smoking status: Current Every Day Smoker    Types: Pipe, Cigars  . Smokeless tobacco: Never Used  . Alcohol use Yes     Comment: occ.  . Drug use: No  . Sexual activity: Not Currently

## 2017-06-11 ENCOUNTER — Other Ambulatory Visit (INDEPENDENT_AMBULATORY_CARE_PROVIDER_SITE_OTHER): Payer: Self-pay | Admitting: Family

## 2017-06-13 ENCOUNTER — Encounter (HOSPITAL_COMMUNITY): Payer: Self-pay | Admitting: *Deleted

## 2017-06-13 NOTE — Progress Notes (Signed)
Pt denies SOB, chest pain, and being under the care of a cardiologist. Pt denies having a stress test and cardiac cath but stated that an echo was performed in [place]. Pt denies having an EKG within the last year. Pt made aware to stop taking vitamins, fish oil and herbal medications. Do not take any NSAIDs ie: Ibuprofen, Advil, Naproxen (Aleve),Motrin, BC and Goody Powder. Pt stated that she stopped taking Aspirin several days ago. Pt denies having recent labs. Pt verbalized understanding of all pre-op instructions.

## 2017-06-14 MED ORDER — LACTATED RINGERS IV SOLN
INTRAVENOUS | Status: DC
  Administered 2017-06-15 (×2): via INTRAVENOUS

## 2017-06-14 MED ORDER — CEFAZOLIN SODIUM-DEXTROSE 2-4 GM/100ML-% IV SOLN
2.0000 g | INTRAVENOUS | Status: AC
  Administered 2017-06-15: 2 g via INTRAVENOUS
  Filled 2017-06-14: qty 100

## 2017-06-15 ENCOUNTER — Ambulatory Visit (HOSPITAL_COMMUNITY)
Admission: RE | Admit: 2017-06-15 | Discharge: 2017-06-15 | Disposition: A | Discharge status: Home / Self Care | Payer: 59 | Source: Ambulatory Visit | Attending: Orthopedic Surgery | Admitting: Orthopedic Surgery

## 2017-06-15 ENCOUNTER — Encounter (HOSPITAL_COMMUNITY): Payer: Self-pay | Admitting: *Deleted

## 2017-06-15 ENCOUNTER — Ambulatory Visit (HOSPITAL_COMMUNITY): Payer: 59 | Admitting: Anesthesiology

## 2017-06-15 ENCOUNTER — Encounter (HOSPITAL_COMMUNITY)
Admission: RE | Disposition: A | Discharge status: Home / Self Care | Payer: Self-pay | Source: Ambulatory Visit | Attending: Orthopedic Surgery

## 2017-06-15 DIAGNOSIS — R011 Cardiac murmur, unspecified: Secondary | ICD-10-CM | POA: Insufficient documentation

## 2017-06-15 DIAGNOSIS — K219 Gastro-esophageal reflux disease without esophagitis: Secondary | ICD-10-CM | POA: Insufficient documentation

## 2017-06-15 DIAGNOSIS — Z79899 Other long term (current) drug therapy: Secondary | ICD-10-CM | POA: Insufficient documentation

## 2017-06-15 DIAGNOSIS — Y793 Surgical instruments, materials and orthopedic devices (including sutures) associated with adverse incidents: Secondary | ICD-10-CM | POA: Insufficient documentation

## 2017-06-15 DIAGNOSIS — L600 Ingrowing nail: Secondary | ICD-10-CM | POA: Insufficient documentation

## 2017-06-15 DIAGNOSIS — M159 Polyosteoarthritis, unspecified: Secondary | ICD-10-CM | POA: Diagnosis not present

## 2017-06-15 DIAGNOSIS — M79675 Pain in left toe(s): Secondary | ICD-10-CM | POA: Insufficient documentation

## 2017-06-15 DIAGNOSIS — E039 Hypothyroidism, unspecified: Secondary | ICD-10-CM | POA: Diagnosis not present

## 2017-06-15 DIAGNOSIS — G4733 Obstructive sleep apnea (adult) (pediatric): Secondary | ICD-10-CM | POA: Diagnosis not present

## 2017-06-15 DIAGNOSIS — F329 Major depressive disorder, single episode, unspecified: Secondary | ICD-10-CM | POA: Diagnosis not present

## 2017-06-15 DIAGNOSIS — M79671 Pain in right foot: Secondary | ICD-10-CM | POA: Diagnosis not present

## 2017-06-15 DIAGNOSIS — Z7982 Long term (current) use of aspirin: Secondary | ICD-10-CM | POA: Diagnosis not present

## 2017-06-15 DIAGNOSIS — Z9989 Dependence on other enabling machines and devices: Secondary | ICD-10-CM | POA: Insufficient documentation

## 2017-06-15 DIAGNOSIS — Z96662 Presence of left artificial ankle joint: Secondary | ICD-10-CM | POA: Diagnosis not present

## 2017-06-15 DIAGNOSIS — T85848S Pain due to other internal prosthetic devices, implants and grafts, sequela: Secondary | ICD-10-CM | POA: Diagnosis not present

## 2017-06-15 DIAGNOSIS — T8484XA Pain due to internal orthopedic prosthetic devices, implants and grafts, initial encounter: Secondary | ICD-10-CM | POA: Diagnosis present

## 2017-06-15 DIAGNOSIS — I10 Essential (primary) hypertension: Secondary | ICD-10-CM | POA: Insufficient documentation

## 2017-06-15 DIAGNOSIS — M722 Plantar fascial fibromatosis: Secondary | ICD-10-CM | POA: Insufficient documentation

## 2017-06-15 DIAGNOSIS — T84098A Other mechanical complication of other internal joint prosthesis, initial encounter: Secondary | ICD-10-CM | POA: Diagnosis not present

## 2017-06-15 DIAGNOSIS — Z888 Allergy status to other drugs, medicaments and biological substances status: Secondary | ICD-10-CM | POA: Insufficient documentation

## 2017-06-15 DIAGNOSIS — E785 Hyperlipidemia, unspecified: Secondary | ICD-10-CM | POA: Insufficient documentation

## 2017-06-15 DIAGNOSIS — Z882 Allergy status to sulfonamides status: Secondary | ICD-10-CM | POA: Diagnosis not present

## 2017-06-15 HISTORY — PX: HARDWARE REMOVAL: SHX979

## 2017-06-15 HISTORY — PX: TOENAIL EXCISION: SHX183

## 2017-06-15 LAB — LIPID PANEL
CHOL/HDL RATIO: 2.3 ratio
CHOLESTEROL: 176 mg/dL (ref 0–200)
HDL: 76 mg/dL (ref >40)
LDL Cholesterol: 91 mg/dL (ref 0–99)
TRIGLYCERIDES: 45 mg/dL (ref <150)
VLDL: 9 mg/dL (ref 0–40)

## 2017-06-15 LAB — CBC
HEMATOCRIT: 37.8 % (ref 36.0–46.0)
HEMOGLOBIN: 12.6 g/dL (ref 12.0–15.0)
MCH: 30.8 pg (ref 26.0–34.0)
MCHC: 33.3 g/dL (ref 30.0–36.0)
MCV: 92.4 fL (ref 78.0–100.0)
Platelets: 295 10*3/uL (ref 150–400)
RBC: 4.09 MIL/uL (ref 3.87–5.11)
RDW: 13.9 % (ref 11.5–15.5)
WBC: 4.8 10*3/uL (ref 4.0–10.5)

## 2017-06-15 LAB — BASIC METABOLIC PANEL
ANION GAP: 7 (ref 5–15)
BUN: 14 mg/dL (ref 6–20)
CALCIUM: 8.8 mg/dL — AB (ref 8.9–10.3)
CHLORIDE: 104 mmol/L (ref 101–111)
CO2: 24 mmol/L (ref 22–32)
CREATININE: 0.93 mg/dL (ref 0.44–1.00)
GFR calc non Af Amer: >60 mL/min (ref >60)
Glucose, Bld: 96 mg/dL (ref 65–99)
Potassium: 4.1 mmol/L (ref 3.5–5.1)
SODIUM: 135 mmol/L (ref 135–145)

## 2017-06-15 LAB — TSH: TSH: 2.338 u[IU]/mL (ref 0.350–4.500)

## 2017-06-15 SURGERY — REMOVAL, HARDWARE
Anesthesia: General | Laterality: Right

## 2017-06-15 MED ORDER — LIDOCAINE 2% (20 MG/ML) 5 ML SYRINGE
INTRAMUSCULAR | Status: AC
  Filled 2017-06-15: qty 5

## 2017-06-15 MED ORDER — OXYCODONE-ACETAMINOPHEN 5-325 MG PO TABS: 1.0000 | ORAL_TABLET | ORAL | 0 refills | Status: DC | PRN

## 2017-06-15 MED ORDER — PHENYLEPHRINE HCL 10 MG/ML IJ SOLN
INTRAVENOUS | Status: DC | PRN
  Administered 2017-06-15: 20 ug/min via INTRAVENOUS

## 2017-06-15 MED ORDER — FENTANYL CITRATE (PF) 100 MCG/2ML IJ SOLN
INTRAMUSCULAR | Status: DC | PRN
  Administered 2017-06-15: 100 ug via INTRAVENOUS

## 2017-06-15 MED ORDER — DEXAMETHASONE SODIUM PHOSPHATE 10 MG/ML IJ SOLN
INTRAMUSCULAR | Status: AC
  Filled 2017-06-15: qty 1

## 2017-06-15 MED ORDER — FENTANYL CITRATE (PF) 100 MCG/2ML IJ SOLN
INTRAMUSCULAR | Status: AC
  Filled 2017-06-15: qty 2

## 2017-06-15 MED ORDER — CHLORHEXIDINE GLUCONATE 4 % EX LIQD: 60.0000 mL | Freq: Once | CUTANEOUS | Status: DC

## 2017-06-15 MED ORDER — DEXAMETHASONE SODIUM PHOSPHATE 10 MG/ML IJ SOLN
INTRAMUSCULAR | Status: DC | PRN
  Administered 2017-06-15: 8 mg via INTRAVENOUS

## 2017-06-15 MED ORDER — PROPOFOL 10 MG/ML IV BOLUS
INTRAVENOUS | Status: AC
  Filled 2017-06-15: qty 20

## 2017-06-15 MED ORDER — FENTANYL CITRATE (PF) 250 MCG/5ML IJ SOLN
INTRAMUSCULAR | Status: AC
  Filled 2017-06-15: qty 5

## 2017-06-15 MED ORDER — ONDANSETRON HCL 4 MG/2ML IJ SOLN
INTRAMUSCULAR | Status: DC | PRN
  Administered 2017-06-15: 4 mg via INTRAVENOUS

## 2017-06-15 MED ORDER — LIDOCAINE HCL (CARDIAC) 20 MG/ML IV SOLN
INTRAVENOUS | Status: DC | PRN
  Administered 2017-06-15: 60 mg via INTRAVENOUS

## 2017-06-15 MED ORDER — PROMETHAZINE HCL 25 MG/ML IJ SOLN: 6.2500 mg | INTRAMUSCULAR | Status: DC | PRN

## 2017-06-15 MED ORDER — OXYCODONE-ACETAMINOPHEN 5-325 MG PO TABS
ORAL_TABLET | ORAL | Status: AC
  Filled 2017-06-15: qty 1

## 2017-06-15 MED ORDER — ONDANSETRON HCL 4 MG/2ML IJ SOLN
INTRAMUSCULAR | Status: AC
  Filled 2017-06-15: qty 2

## 2017-06-15 MED ORDER — FENTANYL CITRATE (PF) 100 MCG/2ML IJ SOLN
25.0000 ug | INTRAMUSCULAR | Status: DC | PRN
  Administered 2017-06-15: 50 ug via INTRAVENOUS

## 2017-06-15 MED ORDER — PROPOFOL 10 MG/ML IV BOLUS
INTRAVENOUS | Status: DC | PRN
Start: 2017-06-15 — End: 2017-06-15
  Administered 2017-06-15: 200 mg via INTRAVENOUS

## 2017-06-15 MED ORDER — OXYCODONE-ACETAMINOPHEN 5-325 MG PO TABS
1.0000 | ORAL_TABLET | Freq: Once | ORAL | Status: AC
  Administered 2017-06-15: 1 via ORAL

## 2017-06-15 MED ORDER — SODIUM CHLORIDE 0.9 % IR SOLN
Status: DC | PRN
  Administered 2017-06-15: 1000 mL

## 2017-06-15 SURGICAL SUPPLY — 42 items
BANDAGE ESMARK 6X9 LF (GAUZE/BANDAGES/DRESSINGS) IMPLANT
BNDG COHESIVE 4X5 TAN STRL (GAUZE/BANDAGES/DRESSINGS) ×3 IMPLANT
BNDG ESMARK 6X9 LF (GAUZE/BANDAGES/DRESSINGS)
BNDG GAUZE ELAST 4 BULKY (GAUZE/BANDAGES/DRESSINGS) ×3 IMPLANT
COVER SURGICAL LIGHT HANDLE (MISCELLANEOUS) ×3 IMPLANT
CUFF TOURNIQUET SINGLE 34IN LL (TOURNIQUET CUFF) IMPLANT
CUFF TOURNIQUET SINGLE 44IN (TOURNIQUET CUFF) IMPLANT
DRAPE C-ARM 42X72 X-RAY (DRAPES) IMPLANT
DRAPE INCISE IOBAN 66X45 STRL (DRAPES) IMPLANT
DRAPE ORTHO SPLIT 77X108 STRL (DRAPES)
DRAPE SURG ORHT 6 SPLT 77X108 (DRAPES) IMPLANT
DRAPE U-SHAPE 47X51 STRL (DRAPES) ×3 IMPLANT
DRSG EMULSION OIL 3X3 NADH (GAUZE/BANDAGES/DRESSINGS) ×3 IMPLANT
DRSG PAD ABDOMINAL 8X10 ST (GAUZE/BANDAGES/DRESSINGS) ×3 IMPLANT
DURAPREP 26ML APPLICATOR (WOUND CARE) ×3 IMPLANT
ELECT REM PT RETURN 9FT ADLT (ELECTROSURGICAL) ×3
ELECTRODE REM PT RTRN 9FT ADLT (ELECTROSURGICAL) ×2 IMPLANT
GAUZE SPONGE 4X4 12PLY STRL (GAUZE/BANDAGES/DRESSINGS) ×3 IMPLANT
GAUZE SPONGE 4X4 12PLY STRL LF (GAUZE/BANDAGES/DRESSINGS) ×3 IMPLANT
GLOVE BIOGEL PI IND STRL 9 (GLOVE) ×2 IMPLANT
GLOVE BIOGEL PI INDICATOR 9 (GLOVE) ×1
GLOVE SURG ORTHO 9.0 STRL STRW (GLOVE) ×3 IMPLANT
GOWN STRL REUS W/ TWL XL LVL3 (GOWN DISPOSABLE) ×6 IMPLANT
GOWN STRL REUS W/TWL XL LVL3 (GOWN DISPOSABLE) ×3
KIT BASIN OR (CUSTOM PROCEDURE TRAY) ×3 IMPLANT
KIT ROOM TURNOVER OR (KITS) ×3 IMPLANT
MANIFOLD NEPTUNE II (INSTRUMENTS) ×3 IMPLANT
NS IRRIG 1000ML POUR BTL (IV SOLUTION) ×3 IMPLANT
PACK ORTHO EXTREMITY (CUSTOM PROCEDURE TRAY) ×3 IMPLANT
PAD ARMBOARD 7.5X6 YLW CONV (MISCELLANEOUS) ×6 IMPLANT
SPONGE LAP 18X18 X RAY DECT (DISPOSABLE) IMPLANT
STAPLER VISISTAT 35W (STAPLE) IMPLANT
STOCKINETTE IMPERVIOUS 9X36 MD (GAUZE/BANDAGES/DRESSINGS) IMPLANT
SUT ETHILON 2 0 PSLX (SUTURE) IMPLANT
SUT VIC AB 0 CT1 27 (SUTURE)
SUT VIC AB 0 CT1 27XBRD ANBCTR (SUTURE) IMPLANT
SUT VIC AB 2-0 CT1 27 (SUTURE)
SUT VIC AB 2-0 CT1 TAPERPNT 27 (SUTURE) IMPLANT
TOWEL OR 17X24 6PK STRL BLUE (TOWEL DISPOSABLE) IMPLANT
TOWEL OR 17X26 10 PK STRL BLUE (TOWEL DISPOSABLE) ×3 IMPLANT
UNDERPAD 30X30 (UNDERPADS AND DIAPERS) ×3 IMPLANT
WATER STERILE IRR 1000ML POUR (IV SOLUTION) IMPLANT

## 2017-06-15 NOTE — Progress Notes (Signed)
Orthopedic Tech Progress Note Patient Details:  Destiny Kernen, MD July 03, 1956 [id number]  Ortho Devices Type of Ortho Device: Postop shoe/boot Ortho Device/Splint Location: applied post op shoe to pt left foot.  pt tolerated application very well.  left foot.  Ortho Device/Splint Interventions: Application, Adjustment   Kristopher Oppenheim 06/15/2017, 12:14 PM

## 2017-06-15 NOTE — Op Note (Signed)
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    The risks benefits and alternatives were discussed with the patient preoperatively including but not limited to the risks of infection, bleeding, nerve injury, cardiopulmonary complications, the need for revision surgery, among others, and the patient was willing to proceed.  OPERATIVE IMPLANTS: None  OPERATIVE FINDINGS: No hardware failure no signs of infection. There is no purulence from the ingrown toenail.  OPERATIVE PROCEDURE: Patient brought the operating room and underwent a general anesthetic. After adequate levels anesthesia obtained patient's both lower extremities were prepped using DuraPrep draped into a sterile field a timeout was called. Patient's previous dorsal incision was made over the left great toe MTP joint. This was carried sharply down to the plate subperiosteal dissection was used to remove the fibrinous tissue. The screws were removed the plate was removed without complications. The bone was cleansed debrided and irrigated elected cart he was used for hemostasis. The incision was closed using 2-0 nylon. Attention was then focused on the right foot. The lateral border of the right great toenail was excised there is no purulence no signs of paronychial infection. Sterile dressings were applied to both feet. Patient was extubated taken to the PACU in stable condition.

## 2017-06-15 NOTE — H&P (Signed)
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   Chief Complaint: Pain from retained hardware MTP fusion. 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 She complains of a painful ingrown nail of the right great toe lateral border and complains of some chronic posterior heel pain. She states she does not have pain in the morning it gets worse after she's been on her feet longer. Patient currently has a negative heel sneaker on with a hard orthotic. Patient has tried anti-inflammatories without relief. Patient is seen Dr. Beola Cord and was told she had plantar fasciitis.  Past Medical History:  Diagnosis Date  . Breast nodule   . DDD (degenerative disc disease), cervical   . Dysthymia   . Generalized OA   . GERD (gastroesophageal reflux disease)   . Hallux rigidus   . Heart murmur    beneign  . Hyperlipidemia   . Hypertension   . OSA on CPAP   . Pseudophakia   . Thyroid disease   . Uterine fibroid     Past Surgical History:  Procedure Laterality Date  . BUNIONECTOMY    . CATARACT EXTRACTION  2016  . fusion   2015   first MTP B  . MYOMECTOMY  1992  . ORIF FINGER FRACTURE  2006  . THYROIDECTOMY  2005  . TONSILLECTOMY  1960    Family History  Problem Relation Age of Onset  . Arthritis Mother   . Depression Mother   . COPD Father   . Hearing loss Father   . Heart disease Father   . Hyperlipidemia Father   . Hypertension Father   . Kidney disease Father   . Alcohol abuse Father   . Depression Sister   . Alcohol abuse Sister   . Heart disease Maternal Grandmother        CHF  . COPD Maternal Grandfather   . Alcohol abuse Paternal Grandmother   . Depression Sister   . Heart disease Sister   . Hyperlipidemia Sister   . Depression Sister   . Heart disease Sister   . Hyperlipidemia Sister    Social History:  reports that she has never smoked. She has  never used smokeless tobacco. She reports that she drinks alcohol. She reports that she does not use drugs.  Allergies:  Allergies  Allergen Reactions  . Vioxx [Rofecoxib] Other (See Comments)    Photodermatitis   . Sulfa Antibiotics Rash    No prescriptions prior to admission.    No results found for this or any previous visit (from the past 48 hour(s)). No results found.  Review of Systems  All other systems reviewed and are negative.   There were no vitals taken for this visit. Physical Exam  Patient is alert, oriented, no adenopathy, well-dressed, normal affect, normal respiratory effort. Examination patient is a good dorsalis pedis and posterior tibial pulse. The tarsal tunnel is nontender to palpation the Achilles tendon and plantar fascia nontender to palpation. Patient has good ankle good subtalar motion she has excellent dorsiflexion of the ankle with the knee extended. Patient's only pain is reproduced with lateral compression of the calcaneus consistent with a stress fracture. There is no redness no cellulitis no skin color or temperature changes.  Assessment/Plan  2. Ingrowing nail, right great toe   3. Pain from implanted hardware, sequela      Patient will  require removal of the painful deep hardware left great toe and we'll also need excision of the lateral border of the right great toe.  Newt Minion, MD 06/15/2017, 6:55 AM

## 2017-06-15 NOTE — Transfer of Care (Signed)
Immediate Anesthesia Transfer of Care Note  Patient: Raylene Everts, MD  Procedure(s) Performed: Left Great Toe Hardware Removal (Left ) Right Great Toe Excision Lateral Border Nail (Right )  Patient Location: PACU  Anesthesia Type:General  Level of Consciousness: awake, alert  and oriented  Airway & Oxygen Therapy: Patient Spontanous Breathing and Patient connected to nasal cannula oxygen  Post-op Assessment: Report given to RN, Post -op Vital signs reviewed and stable and Patient moving all extremities  Post vital signs: Reviewed and stable  Last Vitals:  Vitals:   06/15/17 0849 06/15/17 1143  BP: (!) 152/76 (!) 148/71  Pulse: 77 65  Resp: 20 16  Temp: 36.9 C (!) 36.4 C  SpO2: 100% 100%    Last Pain:  Vitals:   06/15/17 1143  TempSrc:   PainSc: (P) 0-No pain         Complications: No apparent anesthesia complications

## 2017-06-15 NOTE — Anesthesia Procedure Notes (Signed)
Procedure Name: LMA Insertion Date/Time: 06/15/2017 11:07 AM Performed by: Izora Gala Pre-anesthesia Checklist: Patient identified Patient Re-evaluated:Patient Re-evaluated prior to induction Oxygen Delivery Method: Circle system utilized Preoxygenation: Pre-oxygenation with 100% oxygen Induction Type: IV induction Ventilation: Mask ventilation without difficulty LMA Size: 4.0 Number of attempts: 1 Placement Confirmation: positive ETCO2 and breath sounds checked- equal and bilateral Tube secured with: Tape Dental Injury: Teeth and Oropharynx as per pre-operative assessment

## 2017-06-15 NOTE — Anesthesia Postprocedure Evaluation (Signed)
Anesthesia Post Note  Patient: Destiny Everts, MD  Procedure(s) Performed: Left Great Toe Hardware Removal (Left ) Right Great Toe Excision Lateral Border Nail (Right )     Patient location during evaluation: PACU Anesthesia Type: General Level of consciousness: awake and alert Pain management: pain level controlled Vital Signs Assessment: post-procedure vital signs reviewed and stable Respiratory status: spontaneous breathing, nonlabored ventilation, respiratory function stable and patient connected to nasal cannula oxygen Cardiovascular status: blood pressure returned to baseline and stable Postop Assessment: no apparent nausea or vomiting Anesthetic complications: no    Last Vitals:  Vitals:   06/15/17 1215 06/15/17 1219  BP: (!) 153/72 138/82  Pulse: 70 64  Resp: 13 17  Temp: (!) 36.4 C   SpO2: 100% 94%    Last Pain:  Vitals:   06/15/17 1215  TempSrc:   PainSc: 2     LLE Motor Response: Purposeful movement (06/15/17 1215) LLE Sensation: Full sensation (06/15/17 1215) RLE Motor Response: Purposeful movement (06/15/17 1215) RLE Sensation: Full sensation (06/15/17 1215)      Tiajuana Amass

## 2017-06-15 NOTE — Anesthesia Preprocedure Evaluation (Signed)
Anesthesia Evaluation  Patient identified by MRN, date of birth, ID band Patient awake    Reviewed: Allergy & Precautions, NPO status , Patient's Chart, lab work & pertinent test results, reviewed documented beta blocker date and time   Airway Mallampati: II  TM Distance: >3 FB Neck ROM: Full    Dental   Pulmonary sleep apnea and Continuous Positive Airway Pressure Ventilation ,    breath sounds clear to auscultation       Cardiovascular hypertension, Pt. on medications and Pt. on home beta blockers  Rhythm:Regular Rate:Normal     Neuro/Psych Depression negative neurological ROS     GI/Hepatic Neg liver ROS, GERD  ,  Endo/Other  Hypothyroidism   Renal/GU negative Renal ROS     Musculoskeletal  (+) Arthritis ,   Abdominal   Peds  Hematology negative hematology ROS (+)   Anesthesia Other Findings   Reproductive/Obstetrics                             Anesthesia Physical Anesthesia Plan  ASA: II  Anesthesia Plan: General   Post-op Pain Management:    Induction: Intravenous  PONV Risk Score and Plan: 3 and Ondansetron, Dexamethasone, Midazolam and Treatment may vary due to age or medical condition  Airway Management Planned: LMA  Additional Equipment:   Intra-op Plan:   Post-operative Plan: Extubation in OR  Informed Consent: I have reviewed the patients History and Physical, chart, labs and discussed the procedure including the risks, benefits and alternatives for the proposed anesthesia with the patient or authorized representative who has indicated his/her understanding and acceptance.   Dental advisory given  Plan Discussed with: CRNA  Anesthesia Plan Comments: (Discussed post-op block if pain uncontrolled. Pt consented to post-op block in PACU if needed.)        Anesthesia Quick Evaluation

## 2017-06-16 ENCOUNTER — Encounter (HOSPITAL_COMMUNITY): Payer: Self-pay | Admitting: Orthopedic Surgery

## 2017-06-16 LAB — T4: T4, Total: 5.5 ug/dL (ref 4.5–12.0)

## 2017-06-16 LAB — T3: T3, Total: 63 ng/dL — ABNORMAL LOW (ref 71–180)

## 2017-06-21 ENCOUNTER — Ambulatory Visit (INDEPENDENT_AMBULATORY_CARE_PROVIDER_SITE_OTHER): Payer: 59 | Admitting: Orthopedic Surgery

## 2017-06-21 DIAGNOSIS — L600 Ingrowing nail: Secondary | ICD-10-CM

## 2017-06-21 DIAGNOSIS — T85848S Pain due to other internal prosthetic devices, implants and grafts, sequela: Secondary | ICD-10-CM

## 2017-06-21 NOTE — Progress Notes (Signed)
Post-Op Visit Note   Patient: Destiny Winslett, MD           Date of Birth: [birth year]           MRN: [id number] Visit Date: 06/21/2017 PCP: Alycia Rossetti, MD  Chief Complaint: No chief complaint on file.   HPI:  HPI The patient is a 61 year old woman who is seen 1 week status post left great toe removal hardware in right great toe excision of the lateral border of her toenail. Has removed her surgical dressing has been cleansing daily with hydrogen peroxide followed bicep and water. Applying a dry dressing daily. Is ambulating full weightbearing in postop shoe on the right.  Ortho Exam Incision is healing well there is no gaping no erythema no drainage no sign of infection. Left great toenail has been excised laterally. No erythema, drainage. No sign of infection.  Visit Diagnoses:  1. Ingrown nail of great toe of right foot   2. Pain from implanted hardware, sequela     Plan: will have her office staff harvest her sutures next Friday. Continue daily cleansing with soap and water until healed. Dry dressings until healed. Will continue post op shoe until then. Recommended stiff soled walking shoes. May advance weight bearing as tolerated.   Follow-Up Instructions: Return if symptoms worsen or fail to improve.   Imaging: No results found.  Orders:  No orders of the defined types were placed in this encounter.  No orders of the defined types were placed in this encounter.    PMFS History: Patient Active Problem List   Diagnosis Date Noted  . Stress fracture of right calcaneus 06/04/2017  . Ingrown nail of great toe of right foot 06/04/2017  . Pain from implanted hardware 06/04/2017  . OSA on CPAP 10/10/2016  . GERD (gastroesophageal reflux disease) 07/26/2016  . DDD (degenerative disc disease), cervical 07/26/2016  . Generalized OA 07/26/2016  . Hypothyroidism 07/25/2016  . Essential hypertension 07/25/2016  . Hyperlipidemia 07/25/2016  . Dysthymic disorder  07/25/2016   Past Medical History:  Diagnosis Date  . Breast nodule   . DDD (degenerative disc disease), cervical   . Dysthymia   . Generalized OA   . GERD (gastroesophageal reflux disease)   . Hallux rigidus   . Heart murmur    beneign  . Hyperlipidemia   . Hypertension   . OSA on CPAP   . Pseudophakia   . Thyroid disease   . Uterine fibroid     Family History  Problem Relation Age of Onset  . Arthritis Mother   . Depression Mother   . COPD Father   . Hearing loss Father   . Heart disease Father   . Hyperlipidemia Father   . Hypertension Father   . Kidney disease Father   . Alcohol abuse Father   . Depression Sister   . Alcohol abuse Sister   . Heart disease Maternal Grandmother        CHF  . COPD Maternal Grandfather   . Alcohol abuse Paternal Grandmother   . Depression Sister   . Heart disease Sister   . Hyperlipidemia Sister   . Depression Sister   . Heart disease Sister   . Hyperlipidemia Sister     Past Surgical History:  Procedure Laterality Date  . BUNIONECTOMY    . CATARACT EXTRACTION  2016  . fusion   2015   first MTP B  . HARDWARE REMOVAL Left 06/15/2017   Procedure: Left Great Toe  Hardware Removal;  Surgeon: Newt Minion, MD;  Location: [facility];  Service: Orthopedics;  Laterality: Left;  . MYOMECTOMY  1992  . ORIF FINGER FRACTURE  2006  . THYROIDECTOMY  2005  . TOENAIL EXCISION Right 06/15/2017   Procedure: Right Great Toe Excision Lateral Border Nail;  Surgeon: Newt Minion, MD;  Location: [facility];  Service: Orthopedics;  Laterality: Right;  . TONSILLECTOMY  1960   Social History   Occupational History  . Not on file.   Social History Main Topics  . Smoking status: Never Smoker  . Smokeless tobacco: Never Used  . Alcohol use Yes     Comment: occ.  . Drug use: No  . Sexual activity: Not Currently

## 2017-08-15 ENCOUNTER — Other Ambulatory Visit: Payer: Self-pay | Admitting: Family Medicine

## 2017-10-17 ENCOUNTER — Encounter: Payer: Self-pay | Admitting: Family Medicine

## 2017-10-17 ENCOUNTER — Other Ambulatory Visit: Payer: Self-pay

## 2017-10-17 ENCOUNTER — Ambulatory Visit (INDEPENDENT_AMBULATORY_CARE_PROVIDER_SITE_OTHER): Payer: 59 | Admitting: Family Medicine

## 2017-10-17 VITALS — BP 128/68 | HR 82 | Temp 98.5°F | Resp 14 | Ht 67.0 in | Wt 155.0 lb

## 2017-10-17 DIAGNOSIS — E7800 Pure hypercholesterolemia, unspecified: Secondary | ICD-10-CM | POA: Diagnosis not present

## 2017-10-17 DIAGNOSIS — Z23 Encounter for immunization: Secondary | ICD-10-CM | POA: Diagnosis not present

## 2017-10-17 DIAGNOSIS — I10 Essential (primary) hypertension: Secondary | ICD-10-CM | POA: Diagnosis not present

## 2017-10-17 DIAGNOSIS — Z1231 Encounter for screening mammogram for malignant neoplasm of breast: Secondary | ICD-10-CM

## 2017-10-17 DIAGNOSIS — E890 Postprocedural hypothyroidism: Secondary | ICD-10-CM | POA: Diagnosis not present

## 2017-10-17 DIAGNOSIS — F341 Dysthymic disorder: Secondary | ICD-10-CM

## 2017-10-17 DIAGNOSIS — Z0000 Encounter for general adult medical examination without abnormal findings: Secondary | ICD-10-CM

## 2017-10-17 DIAGNOSIS — Z1239 Encounter for other screening for malignant neoplasm of breast: Secondary | ICD-10-CM

## 2017-10-17 MED ORDER — ATORVASTATIN CALCIUM 20 MG PO TABS: 20.0000 mg | ORAL_TABLET | Freq: Every day | ORAL | 3 refills | Status: DC

## 2017-10-17 MED ORDER — LEVOTHYROXINE SODIUM 125 MCG PO TABS: 125.0000 ug | ORAL_TABLET | Freq: Every day | ORAL | 3 refills | Status: DC

## 2017-10-17 MED ORDER — ATENOLOL 25 MG PO TABS: 25.0000 mg | ORAL_TABLET | Freq: Every day | ORAL | 3 refills | Status: DC

## 2017-10-17 MED ORDER — OMEPRAZOLE 20 MG PO CPDR: 20.0000 mg | DELAYED_RELEASE_CAPSULE | Freq: Every day | ORAL | 3 refills | Status: DC

## 2017-10-17 MED ORDER — DULOXETINE HCL 60 MG PO CPEP: 60.0000 mg | ORAL_CAPSULE | Freq: Every day | ORAL | 3 refills | Status: DC

## 2017-10-17 NOTE — Assessment & Plan Note (Signed)
Well controlled Lipids at goal recently

## 2017-10-17 NOTE — Assessment & Plan Note (Signed)
Recent fasting labs reviewed TFT at goal

## 2017-10-17 NOTE — Assessment & Plan Note (Signed)
Doing well on cymbalta she still does not sleep well, dicussed medication, yoga, essential oils Declines medications

## 2017-10-17 NOTE — Progress Notes (Signed)
   Subjective:    Patient ID: Destiny Everts, MD, female    DOB: 08/28/1956, 62 y.o.   MRN: [id number]  Patient presents for CPE (is not fasting)  Patient here for complete physical exam medications and history reviewed  Hypertension-she is taking medication as prescribed Dysthymic disorder she is on antidepressants, still has insomnia, very borken sleep  Now working out with treadmill 120 minutes a week   Hyperlipidemia has family history of heart disease and cholesterol  Hypothyroidism currently on thyroid replacement due for repeat thyroid function studies  Due for mammogram Colonoscopy up-to-date Immunizations reviewed due for tetanus booster this year.  Discussed shingles vaccine  Dr. Sharol Given - orthopedics   No longer using CPAP    Review Of Systems:  GEN- denies fatigue, fever, weight loss,weakness, recent illness HEENT- denies eye drainage, change in vision, nasal discharge, CVS- denies chest pain, palpitations RESP- denies SOB, cough, wheeze ABD- denies N/V, change in stools, abd pain GU- denies dysuria, hematuria, dribbling, incontinence MSK- denies joint pain, muscle aches, injury Neuro- denies headache, dizziness, syncope, seizure activity       Objective:    BP 128/68   Pulse 82   Temp 98.5 F (36.9 C) (Oral)   Resp 14   Ht 5\' 7"  (1.702 m)   Wt 155 lb (70.3 kg)   SpO2 98%   BMI 24.28 kg/m  GEN- NAD, alert and oriented x3 HEENT- PERRL, EOMI, non injected sclera, pink conjunctiva, MMM, oropharynx clear Neck- Supple, no thyromegaly CVS- RRR, no murmur RESP-CTAB ABD-NABS,soft,NT,ND Psych- normal affect and mood  EXT- No edema Pulses- Radial, DP- 2+        Assessment & Plan:      Problem List Items Addressed This Visit      Unprioritized   Hyperlipidemia   Hypothyroidism    Recent fasting labs reviewed TFT at goal      Essential hypertension    Well controlled Lipids at goal recently      Dysthymic disorder    Doing well on  cymbalta she still does not sleep well, dicussed medication, yoga, essential oils Declines medications       Other Visit Diagnoses    Routine general medical examination at a health care facility    -  Primary   CPE done, mammogram to be scheduled, Bone Density age 33, PAP UTD, shingrix given   Relevant Orders   Varicella-zoster vaccine IM (Shingrix) (Completed)   Breast cancer screening       Relevant Orders   MM SCREENING BREAST TOMO BILATERAL   Need for shingles vaccine       Relevant Orders   Varicella-zoster vaccine IM (Shingrix) (Completed)      Note: This dictation was prepared with Dragon dictation along with smaller phrase technology. Any transcriptional errors that result from this process are unintentional.

## 2017-10-17 NOTE — Patient Instructions (Addendum)
Schedule Mammogram  Shingrix  2nd dose in 3-6 months  F/U 6 months

## 2017-10-23 ENCOUNTER — Telehealth: Payer: Self-pay | Admitting: Family Medicine

## 2017-10-23 NOTE — Telephone Encounter (Signed)
noted 

## 2017-10-23 NOTE — Telephone Encounter (Signed)
Noted. Added to allergy list.  

## 2017-10-23 NOTE — Telephone Encounter (Signed)
Dr. [name] called and wanted to inform us that she had a "horrible reaction" to her shingrix inj. She is better now, but she experienced a fever, pain, swelling, redness at inj site, low bp, dizziness, feelings of fainting and a bunch of other things. She had to call out of work for two days because she just simply could not function from the inj. Just FYI.

## 2017-11-09 ENCOUNTER — Ambulatory Visit (INDEPENDENT_AMBULATORY_CARE_PROVIDER_SITE_OTHER): Payer: 59 | Admitting: Family Medicine

## 2017-11-09 DIAGNOSIS — S0101XA Laceration without foreign body of scalp, initial encounter: Secondary | ICD-10-CM

## 2017-11-09 NOTE — Progress Notes (Signed)
Patient ID: Destiny Everts, MD, female    DOB: 1956/01/09, 62 y.o.   MRN: [id number]  No chief complaint on file.   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  HPI I was called in the exam room to see Dr. [name] today because she hit her head on the edge of the exam room cabinet.  She was bent down and raised up and hit her head.  She did not have any loss of consciousness.  She exited the room where she hit her head and was assisted by her nurse, Hansel Starling.  Andria Frames came and got me to evaluate Dr. [name] head.  When I entered the room she was sitting in a chair holding a compress on her head.  She reports that her head hurts a little but she was more concerned because it was bleeding.  She did not pass out.  Does not feel dizzy.  Tetanus shot is up-to-date.      Past Medical History:  Diagnosis Date  . Breast nodule   . DDD (degenerative disc disease), cervical   . Dysthymia   . Generalized OA   . GERD (gastroesophageal reflux disease)   . Hallux rigidus   . Heart murmur    beneign  . Hyperlipidemia   . Hypertension   . OSA on CPAP   . Pseudophakia   . Thyroid disease   . Uterine fibroid     Past Surgical History:  Procedure Laterality Date  . BUNIONECTOMY    . CATARACT EXTRACTION  2016  . fusion   2015   first MTP B  . HARDWARE REMOVAL Left 06/15/2017   Procedure: Left Great Toe Hardware Removal;  Surgeon: Newt Minion, MD;  Location: [facility];  Service: Orthopedics;  Laterality: Left;  . MYOMECTOMY  1992  . ORIF FINGER FRACTURE  2006  . THYROIDECTOMY  2005  . TOENAIL EXCISION Right 06/15/2017   Procedure: Right Great Toe Excision Lateral Border Nail;  Surgeon: Newt Minion, MD;  Location: [facility];  Service: Orthopedics;  Laterality: Right;  . TONSILLECTOMY  1960    Family History  Problem Relation Age of Onset   . Arthritis Mother   . Depression Mother   . COPD Father   . Hearing loss Father   . Heart disease Father   . Hyperlipidemia Father   . Hypertension Father   . Kidney disease Father   . Alcohol abuse Father   . Depression Sister   . Alcohol abuse Sister   . Heart disease Maternal Grandmother        CHF  . COPD Maternal Grandfather   . Alcohol abuse Paternal Grandmother   . Depression Sister   . Heart disease Sister   . Hyperlipidemia Sister   . Depression Sister   . Heart disease Sister   . Hyperlipidemia Sister      Social History   Socioeconomic History  . Marital status: Divorced    Spouse name: Not on file  . Number of children: Not on file  . Years of education: Not on file  . Highest education level: Not on file  Social Needs  . Financial resource strain: Not on file  . Food insecurity - worry: Not on file  . Food insecurity - inability: Not on file  . Transportation needs - medical: Not on file  .  Transportation needs - non-medical: Not on file  Occupational History  . Not on file  Tobacco Use  . Smoking status: Never Smoker  . Smokeless tobacco: Never Used  Substance and Sexual Activity  . Alcohol use: Yes    Comment: occ.  . Drug use: No  . Sexual activity: Not Currently  Other Topics Concern  . Not on file  Social History Narrative  . Not on file    Review of Systems   Objective:   There were no vitals taken for this visit.  Physical Exam Well-developed, well-groomed female sitting in chair in exam room when I enter.  Holding pressure on scalp laceration.  Compression removed an area visualized.  Approximate 2 inch clean laceration on the crown of her head.  Area was cleaned with Betadine.  Laceration was injected with 2% epinephrine. 3 interrupted sutures were placed in scalp with 4-0 Ethilon. Patient tolerated procedure well.  Bleeding minimal.  No complications. Mupirocin was applied and area dressed appropriately.  Stable throughout  procedure without pain or problems.  Assessment and Plan   Head laceration secondary to hitting it on edge of cabinet. Sutures placed.   Wound approximated.   Bleeding stopped. Patient understands wound care.  She is stable to drive home.  She will use over-the-counter Tylenol as needed for pain.  She will call with any questions or concerns.  No Follow-up on file. [name], MD 11/09/2017

## 2017-12-03 ENCOUNTER — Encounter: Payer: Self-pay | Admitting: Family Medicine

## 2017-12-03 ENCOUNTER — Other Ambulatory Visit: Payer: Self-pay

## 2017-12-03 ENCOUNTER — Ambulatory Visit (INDEPENDENT_AMBULATORY_CARE_PROVIDER_SITE_OTHER): Payer: 59 | Admitting: Family Medicine

## 2017-12-03 VITALS — BP 122/68 | HR 72 | Temp 98.6°F | Resp 14 | Ht 67.0 in | Wt 158.0 lb

## 2017-12-03 DIAGNOSIS — T50Z95A Adverse effect of other vaccines and biological substances, initial encounter: Secondary | ICD-10-CM | POA: Diagnosis not present

## 2017-12-03 DIAGNOSIS — M25512 Pain in left shoulder: Secondary | ICD-10-CM

## 2017-12-03 NOTE — Progress Notes (Signed)
   Subjective:    Patient ID: Destiny Everts, MD, female    DOB: 04-08-1956, 62 y.o.   MRN: [id number]  Patient presents for L Shoulder Pain (x months- reports pain in shoulder since getting Shingrix- no relief with RICE)  Here with persistent left shoulder pain think she had a shingles vaccine on February 6.  She feels that this shingles vaccine was given a bit high instead of in the mid deltoid.  Happened where she had localized reaction of erythema redness swelling but then had systemic symptoms of fever malaise myalgias.  She treated with anti-inflammatories ice.  The erythema and systemic symptoms finally resolved however she continues to have joint pain.  She has had some mild decrease in her range of motion especially with reach behind and internal rotation.  She also noted to see raises above 90 degrees she has popping sensation.  She has not noted any significant swelling since the initial went down.  She did try taking prednisone 40 mg for a few days but this did not help.  She spoke with risk-management with regards to how the injections are given in general as well as a Sports coach for [facility] health with her concerns.  They recommended that she be evaluated by myself to see if she needs imaging or specialist.  No previous reactions to vaccines  Review Of Systems:  GEN- denies fatigue, fever, weight loss,weakness, recent illness HEENT- denies eye drainage, change in vision, nasal discharge, CVS- denies chest pain, palpitations RESP- denies SOB, cough, wheeze MSK- + joint pain, muscle aches, injury Neuro- denies headache, dizziness, syncope, seizure activity       Objective:    BP 122/68   Pulse 72   Temp 98.6 F (37 C) (Oral)   Resp 14   Ht 5\' 7"  (1.702 m)   Wt 158 lb (71.7 kg)   SpO2 100%   BMI 24.75 kg/m  GEN- NAD, alert and oriented x3 MSK- FROM RUE, left UE, decreased reach behind, elevation above 90 degrees, clicking sensation, no overt swelling of shoulder joint,  no nodule palpated. Biceps in tact, rotator cuff grossly in tact        Assessment & Plan:      Problem List Items Addressed This Visit    None    Visit Diagnoses    Left shoulder pain, unspecified chronicity    -  Primary   Unclear cause of her continued pain, vaccine is the only thing in common when symptoms started but was not an intra-articular injection? She seems to have some chronic inflammation which I can not pinpoint and did not respond to typical RICE therapy/NSAIDS/Steroids.  Not sure if this is a bursitis or capsulitis, and I have never seen vaccine injection cause this type of joint pain. Will refer to orthopedics for there evaluation and input   Adverse effect of vaccine, initial encounter          Note: This dictation was prepared with Dragon dictation along with smaller phrase technology. Any transcriptional errors that result from this process are unintentional.

## 2017-12-03 NOTE — Patient Instructions (Signed)
Referral to [facility] F/U as needed

## 2017-12-04 ENCOUNTER — Encounter (INDEPENDENT_AMBULATORY_CARE_PROVIDER_SITE_OTHER): Payer: Self-pay | Admitting: Orthopedic Surgery

## 2017-12-04 ENCOUNTER — Ambulatory Visit (INDEPENDENT_AMBULATORY_CARE_PROVIDER_SITE_OTHER): Payer: 59 | Admitting: Orthopedic Surgery

## 2017-12-04 VITALS — Ht 67.0 in | Wt 158.0 lb

## 2017-12-04 DIAGNOSIS — M7542 Impingement syndrome of left shoulder: Secondary | ICD-10-CM

## 2017-12-04 MED ORDER — LIDOCAINE HCL 1 % IJ SOLN
5.0000 mL | INTRAMUSCULAR | Status: AC | PRN
  Administered 2017-12-04: 5 mL

## 2017-12-04 MED ORDER — METHYLPREDNISOLONE ACETATE 40 MG/ML IJ SUSP
40.0000 mg | INTRAMUSCULAR | Status: AC | PRN
  Administered 2017-12-04: 40 mg via INTRA_ARTICULAR

## 2017-12-04 NOTE — Progress Notes (Signed)
Office Visit Note   Patient: Destiny Graven, MD           Date of Birth: [birth year]           MRN: [id number] Visit Date: 12/04/2017              Requested by: Alycia Rossetti, MD [address] [place],  [address] PCP: Alycia Rossetti, MD  Chief Complaint  Patient presents with  . Left Shoulder - Pain    S/p zostavax injection 10/17/17      HPI: Patient is a 62 year old woman who is status post a shingles injection that by the sound of the history went into the rotator cuff or the subacromial space.  Patient has been having pain and swelling in her shoulder since that time decreased range of motion and pain with abduction and flexion.  She is tried prednisone without relief she has tried anti-inflammatories and ice without relief.  Patient states that she did have fever and body aches after the shingles injection.  Patient states that the injection was provided to high into the deltoid muscle and may have entered the rotator cuff or subacromial space.  Assessment & Plan: Visit Diagnoses:  1. Impingement syndrome of left shoulder     Plan: Patient received a subacromial injection.  She had interval relief after the injection.  She will follow-up as needed.  No evidence of trauma do not think that diagnostic images are necessary at this time.  Follow-Up Instructions: Return if symptoms worsen or fail to improve.   Ortho Exam  Patient is alert, oriented, no adenopathy, well-dressed, normal affect, normal respiratory effort. Examination patient lacks about 20 degrees of flexion and extension of the glenohumeral joint on the left compared to the right there is no adhesive capsulitis the [facility] joint is nontender to palpation the biceps tendon is nontender to palpation she does have pain with [place] impingement test pain with a drop arm test.  She is tender to palpation in the subacromial space.  Imaging: No results found. No images are attached to the  encounter.  Labs: Lab Results  Component Value Date   REPTSTATUS 12/18/2016 FINAL 12/13/2016   REPTSTATUS 12/18/2016 FINAL 12/13/2016   CULT NO GROWTH 5 DAYS 12/13/2016   CULT NO GROWTH 5 DAYS 12/13/2016   LABORGA NO GROWTH 12/22/2016    @LABSALLVALUES (HGBA1)@  Body mass index is 24.75 kg/m.  Orders:  Orders Placed This Encounter  Procedures  . Large Joint Inj   No orders of the defined types were placed in this encounter.    Procedures: Large Joint Inj: L subacromial bursa on 12/04/2017 3:22 PM Indications: diagnostic evaluation and pain Details: 22 G 1.5 in needle, posterior approach  Arthrogram: No  Medications: 5 mL lidocaine 1 %; 40 mg methylPREDNISolone acetate 40 MG/ML Outcome: tolerated well, no immediate complications Procedure, treatment alternatives, risks and benefits explained, specific risks discussed. Consent was given by the patient. Immediately prior to procedure a time out was called to verify the correct patient, procedure, equipment, support staff and site/side marked as required. Patient was prepped and draped in the usual sterile fashion.      Clinical Data: No additional findings.  ROS:  All other systems negative, except as noted in the HPI. Review of Systems  Objective: Vital Signs: Ht 5\' 7"  (1.702 m)   Wt 158 lb (71.7 kg)   BMI 24.75 kg/m   Specialty Comments:  No specialty comments available.  [place]  History: Patient Active Problem List   Diagnosis Date Noted  . Stress fracture of right calcaneus 06/04/2017  . Ingrown nail of great toe of right foot 06/04/2017  . Pain from implanted hardware 06/04/2017  . OSA on CPAP 10/10/2016  . GERD (gastroesophageal reflux disease) 07/26/2016  . DDD (degenerative disc disease), cervical 07/26/2016  . Generalized OA 07/26/2016  . Hypothyroidism 07/25/2016  . Essential hypertension 07/25/2016  . Hyperlipidemia 07/25/2016  . Dysthymic disorder 07/25/2016   Past Medical History:   Diagnosis Date  . Breast nodule   . DDD (degenerative disc disease), cervical   . Dysthymia   . Generalized OA   . GERD (gastroesophageal reflux disease)   . Hallux rigidus   . Heart murmur    beneign  . Hyperlipidemia   . Hypertension   . OSA on CPAP   . Pseudophakia   . Thyroid disease   . Uterine fibroid     Family History  Problem Relation Age of Onset  . Arthritis Mother   . Depression Mother   . COPD Father   . Hearing loss Father   . Heart disease Father   . Hyperlipidemia Father   . Hypertension Father   . Kidney disease Father   . Alcohol abuse Father   . Depression Sister   . Alcohol abuse Sister   . Heart disease Maternal Grandmother        CHF  . COPD Maternal Grandfather   . Alcohol abuse Paternal Grandmother   . Depression Sister   . Heart disease Sister   . Hyperlipidemia Sister   . Depression Sister   . Heart disease Sister   . Hyperlipidemia Sister     Past Surgical History:  Procedure Laterality Date  . BUNIONECTOMY    . CATARACT EXTRACTION  2016  . fusion   2015   first MTP B  . HARDWARE REMOVAL Left 06/15/2017   Procedure: Left Great Toe Hardware Removal;  Surgeon: Newt Minion, MD;  Location: [facility];  Service: Orthopedics;  Laterality: Left;  . MYOMECTOMY  1992  . ORIF FINGER FRACTURE  2006  . THYROIDECTOMY  2005  . TOENAIL EXCISION Right 06/15/2017   Procedure: Right Great Toe Excision Lateral Border Nail;  Surgeon: Newt Minion, MD;  Location: [facility];  Service: Orthopedics;  Laterality: Right;  . TONSILLECTOMY  1960   Social History   Occupational History  . Not on file  Tobacco Use  . Smoking status: Never Smoker  . Smokeless tobacco: Never Used  Substance and Sexual Activity  . Alcohol use: Yes    Comment: occ.  . Drug use: No  . Sexual activity: Not Currently

## 2017-12-05 ENCOUNTER — Other Ambulatory Visit: Payer: Self-pay | Admitting: Family Medicine

## 2017-12-05 ENCOUNTER — Ambulatory Visit (HOSPITAL_COMMUNITY)
Admission: RE | Admit: 2017-12-05 | Discharge: 2017-12-05 | Disposition: A | Discharge status: Home / Self Care | Payer: 59 | Source: Ambulatory Visit | Attending: Family Medicine | Admitting: Family Medicine

## 2017-12-05 DIAGNOSIS — Z1239 Encounter for other screening for malignant neoplasm of breast: Secondary | ICD-10-CM

## 2017-12-05 DIAGNOSIS — Z1231 Encounter for screening mammogram for malignant neoplasm of breast: Secondary | ICD-10-CM | POA: Insufficient documentation

## 2018-03-29 ENCOUNTER — Ambulatory Visit: Payer: 59 | Admitting: Family Medicine

## 2018-03-29 VITALS — BP 126/80 | HR 64 | Temp 98.7°F | Wt 155.5 lb

## 2018-03-29 DIAGNOSIS — M25551 Pain in right hip: Secondary | ICD-10-CM | POA: Diagnosis not present

## 2018-03-29 DIAGNOSIS — M25552 Pain in left hip: Secondary | ICD-10-CM

## 2018-03-29 MED ORDER — CELECOXIB 200 MG PO CAPS
200.0000 mg | ORAL_CAPSULE | Freq: Two times a day (BID) | ORAL | 1 refills | Status: DC
Start: 2018-03-29 — End: 2018-07-08

## 2018-03-29 MED ORDER — CYCLOBENZAPRINE HCL 5 MG PO TABS: 5.0000 mg | ORAL_TABLET | Freq: Three times a day (TID) | ORAL | 0 refills | Status: DC | PRN

## 2018-03-29 NOTE — Patient Instructions (Addendum)
Switch NSAIDS, continue PPI, use muscle relaxer as needed.  Look up piriformis stretches and IT band stretches, can try foam roller over hip.    PT referral sent through.    Please contact me if you feel inflammation worsens and you would like to try steroid burst or taper.  I would recommend trying it again with the SI joint tenderness.  May have some sacroiliitis and with radicular symptoms steroids are indicated and can be helpful.  Piriformis Syndrome Piriformis syndrome is a condition that can cause pain and numbness in your buttocks and down the back of your leg. Piriformis syndrome happens when the small muscle that connects the base of your spine to your hip (piriformis muscle) presses on the nerve that runs down the back of your leg (sciatic nerve). The piriformis muscle helps your hip rotate and helps to bring your leg back and out. It also helps shift your weight while you are walking to keep you stable. The sciatic nerve runs under or through the piriformis. Damage to the piriformis muscle can cause spasms that put pressure on the nerve below. This causes pain and discomfort while sitting and moving. The pain may feel as if it begins in the buttock and spreads (radiates) down your hip and thigh. What are the causes? This condition is caused by pressure on the sciatic nerve from the piriformis muscle. The piriformis muscle can get irritated with overuse, especially if other hip muscles are weak and the piriformis has to do extra work. Piriformis syndrome can also occur after an injury, like a fall onto your buttocks. What increases the risk? This condition is more likely to develop in:  Women.  People who sit for long periods of time.  Cyclists.  People who have weak buttocks muscles (gluteal muscles).  What are the signs or symptoms? Pain, tingling, or numbness that starts in the buttock and runs down the back of your leg (sciatica) is the most common symptom of this condition.  Your symptoms may:  Get worse the longer you sit.  Get worse when you walk, run, or go up on stairs.  How is this diagnosed? This condition is diagnosed based on your symptoms, medical history, and physical exam. During this exam, your health care provider may move your leg into different positions to check for pain. He or she will also press on the muscles of your hip and buttock to see if that increases your symptoms. You may also have an X-ray or MRI. How is this treated? Treatment for this condition may include:  Stopping all activities that cause pain or make your condition worse.  Using heat or ice to relieve pain as told by your health care provider.  Taking medicines to reduce pain and swelling.  Taking a muscle relaxer to release the piriformis muscle.  Doing range-of-motion and strengthening exercises (physical therapy) as told by your health care provider.  Massaging the affected area.  Getting an injection of an anti-inflammatory medicine or muscle relaxer to reduce inflammation and muscle tension.  In rare cases, you may need surgery to cut the muscle and release pressure on the nerve if other treatments do not work. Follow these instructions at home:  Take over-the-counter and prescription medicines only as told by your health care provider.  Do not sit for long periods. Get up and walk around every 20 minutes or as often as told by your health care provider.  If directed, apply heat to the affected area as often  as told by your health care provider. Use the heat source that your health care provider recommends, such as a moist heat pack or a heating pad. ? Place a towel between your skin and the heat source. ? Leave the heat on for 20-30 minutes. ? Remove the heat if your skin turns bright red. This is especially important if you are unable to feel pain, heat, or cold. You may have a greater risk of getting burned.  If directed, apply ice to the injured  area. ? Put ice in a plastic bag. ? Place a towel between your skin and the bag. ? Leave the ice on for 20 minutes, 2-3 times a day.  Do exercises as told by your health care provider.  Return to your normal activities as told by your health care provider. Ask your health care provider what activities are safe for you.  Keep all follow-up visits as told by your health care provider. This is important. How is this prevented?  Do not sit for longer than 20 minutes at a time. When you sit, choose padded surfaces.  Warm up and stretch before being active.  Cool down and stretch after being active.  Give your body time to rest between periods of activity.  Make sure to use equipment that fits you.  Maintain physical fitness, including: ? Strength. ? Flexibility. Contact a health care provider if:  Your pain and stiffness continue or get worse.  Your leg or hip becomes weak.  You have changes in your bowel function or bladder function. This information is not intended to replace advice given to you by your health care provider. Make sure you discuss any questions you have with your health care provider. Document Released: 08/28/2005 Document Revised: 05/02/2016 Document Reviewed: 08/10/2015 Elsevier Interactive Patient Education  2018 [place] Bursitis Iliotibial bursitis is inflammation of the bursa on the outside of the knee. A bursa is a fluid-filled sac that is often found near a joint. Bursas act as cushions to help tendons glide smoothly over bony surfaces during joint movement. The iliotibial bursa is located beneath a long tendon (iliotibial band) that connects muscles of the buttock, hip, and upper leg to the outside of the shin bone. This condition is also called iliotibial band friction syndrome. What are the causes? This condition is caused by repeated rubbing of the tendon over the bursa, which occurs with repetitive activity. This friction causes fluid  to build up inside the bursa. The bursa swells, and that causes pain in the area where it is located. What increases the risk? The following factors may make you more likely to develop this condition:  Doing athletic activities that involve repetitive squatting, running, cutting, and side-to-side movements.  Overtraining, or starting a new athletic activity without gradually increasing your time and distance.  Participating in certain sports, such as: ? Basketball. ? Cross country running. ? Football. ? Rugby. ? Racquet sports. ? Soccer. ? Volleyball. ? Cycling.  Being 3-44 years old and having knee arthritis.  Being a middle-aged woman who is overweight.  Having flat feet or knee deformities.  Having diabetes.  What are the signs or symptoms? Symptoms of this condition include:  Pain on the outside of your knee. The pain may also be felt on the outside of your leg near the knee.  Pain that gets worse with activity, especially stairs or prolonged walking or running.  Tenderness when pressing on the side of your knee.  Knee swelling that  may or may not include increased warmth or redness.  How is this diagnosed? This condition is usually diagnosed based on your symptoms, your medical history, and a physical exam. During the exam, your health care provider will check your:  Knee motion.  Knee strength.  Amount of pain when the outside of your knee is touched or pressed on.  Ability to do activities such as walking or stairs.  Rarely, other tests may be done to rule out other causes of your symptoms. These tests may include:  MRI.  Ultrasound.  An injection of numbing medicine into the bursa to see if the pain will go away.  How is this treated? Treatment for this condition may include:  Avoiding activities that cause pain and swelling.  Icing your knee.  Wearing an elastic wrap or sleeve to support your knee.  Keeping your knee raised (elevated) when  resting.  Taking an NSAID to reduce pain and swelling.  Getting injected with a numbing medicine and an anti-inflammatory medicine (steroid).  Doing stretching and strengthening exercises.  Treatment usually improves the pain in 6-8 weeks. Surgery is sometimes needed to drain or remove the bursa. Follow these instructions at home: If you have a compression wrap or sleeve:  Wear it as told by your health care provider. Remove it only as told by your health care provider.  Loosen the wrap or sleeve if your foot or toes tingle, become numb, or turn cold and blue.  Do not let the wrap or sleeve get wet.  Keep the wrap or sleeve clean. Managing pain, stiffness, and swelling   If directed, put ice on the knee. ? Put ice in a plastic bag. ? Place a towel between your skin and the bag. ? Leave the ice on for 20 minutes, 2-3 times a day.  Elevate your leg above the level of your heart while you are sitting or lying down. Activity  Return to your normal activities as told by your health care provider. Ask your health care provider what activities are safe for you.  Do exercises as told by your health care provider. General instructions  Take over-the-counter and prescription medicines only as told by your health care provider.  Keep all follow-up visits as told by your health care provider. This is important. How is this prevented?  Warm up and stretch before being active.  Cool down and stretch after being active.  Give your body time to rest between periods of activity.  Make sure to use equipment that fits you.  Maintain physical fitness, including: ? Strength. ? Flexibility. Contact a health care provider if:  You have pain that is not relieved by rest or treatment.  Your symptoms get worse or do not improve with home care. This information is not intended to replace advice given to you by your health care provider. Make sure you discuss any questions you have with  your health care provider. Document Released: 08/28/2005 Document Revised: 05/04/2016 Document Reviewed: 08/10/2015 Elsevier Interactive Patient Education  [employer].

## 2018-03-29 NOTE — Progress Notes (Signed)
Patient ID: Destiny Everts, MD, female    DOB: August 04, 1956, 62 y.o.   MRN: [id number]  PCP: Alycia Rossetti, MD  Chief Complaint  Patient presents with  . Hip Pain    Patient has hx of bursitis. 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  The extra length of hours on her feet have seemed to gradually worsened hip pain. Pain is also worse at night and she is having diffculty sleeping, she sleeps on her sides and pain wakes her, and she constantly switches sides, has tried a wedge under her knees, and at baseline has difficulty sleeping on her back secondary to OSA.  Pain is located to outer upper thighs b/l over greater trochanter, R>L, with intermittent radiation of pain down outer right leg to outer ankle and some intermittent sciatica sx and low back/right buttock pain associated.  Her sx have been worsening for at least 2-3 months despite multiple attempts to treat, rest or stretch.  No improvement with OTC NSAIDs, they have aggravated her GERD.  She tried prednisone 50 mg x 5 days and she had some benefit with pain at night, but no change otherwise.  No improvement with walking and stretching.  She would like to adjust some medications, try a injection, and feels PT referral may help her sx as well.  She does not tolerate meloxicam but has done well with celebrex in the past and she requests it and denies allergy to it despite her sulfa allergy.  She was previously on 200 mg celebrex BID.      Patient Active Problem List   Diagnosis Date Noted  . Stress fracture of right calcaneus 06/04/2017  . Ingrown nail of great toe of  right foot 06/04/2017  . Pain from implanted hardware 06/04/2017  . OSA on CPAP 10/10/2016  . GERD (gastroesophageal reflux disease) 07/26/2016  . DDD (degenerative disc disease), cervical 07/26/2016  . Generalized OA 07/26/2016  . Hypothyroidism 07/25/2016  . Essential hypertension 07/25/2016  . Hyperlipidemia 07/25/2016  . Dysthymic disorder 07/25/2016     Prior to Admission medications   Medication Sig Start Date End Date Taking? Authorizing Provider  acetaminophen (TYLENOL) 500 MG tablet Take 1,000 mg by mouth every 6 (six) hours as needed for mild pain or headache.    Yes [provider]  aspirin EC 81 MG tablet Take 81 mg by mouth daily.   Yes [provider]  atenolol (TENORMIN) 25 MG tablet Take 1 tablet (25 mg total) by mouth daily. 10/17/17  Yes [place], Modena Nunnery, MD  atorvastatin (LIPITOR) 20 MG tablet Take 1 tablet (20 mg total) by mouth daily. 10/17/17  Yes [place], Modena Nunnery, MD  DULoxetine (CYMBALTA) 60 MG capsule Take 1 capsule (60 mg total) by mouth daily. 10/17/17  Yes [place], Modena Nunnery, MD  levothyroxine (SYNTHROID, LEVOTHROID) 125 MCG tablet Take 1 tablet (125 mcg total) by mouth daily. 10/17/17  Yes [place], Modena Nunnery, MD  omeprazole (PRILOSEC) 20 MG capsule Take 1 capsule (20 mg total) by mouth daily. 10/17/17  Yes [place], Modena Nunnery, MD     Allergies  Allergen Reactions  . Shingrix [Zoster Vac Recomb Adjuvanted] Other (See Comments)    Localized reaction with fever, hypotension, vertigo  . Vioxx [Rofecoxib] Other (See Comments)    Photodermatitis   . Sulfa Antibiotics Rash     Family History  Problem Relation Age of Onset  . Arthritis Mother   . Depression Mother   . COPD Father   . Hearing loss Father   . Heart disease Father   . Hyperlipidemia Father   . Hypertension Father   . Kidney disease Father   . Alcohol abuse Father   . Depression Sister   . Alcohol abuse Sister   . Heart disease Maternal Grandmother        CHF  . COPD Maternal  Grandfather   . Alcohol abuse Paternal Grandmother   . Depression Sister   . Heart disease Sister   . Hyperlipidemia Sister   . Depression Sister   . Heart disease Sister   . Hyperlipidemia Sister      Social History   Socioeconomic History  . Marital status: Divorced    Spouse name: Not on file  . Number of children: Not on file  . Years of education: Not on file  . Highest education level: Not on file  Occupational History  . Not on file  Social Needs  . Financial resource strain: Not on file  . Food insecurity:    Worry: Not on file    Inability: Not on file  . Transportation needs:    Medical: Not on file    Non-medical: Not on file  Tobacco Use  . Smoking status: Never Smoker  . Smokeless tobacco: Never Used  Substance and Sexual Activity  . Alcohol use: Yes    Comment: occ.  . Drug use: No  . Sexual activity: Not Currently  Lifestyle  . Physical activity:    Days per week: Not on file    Minutes per session: Not on file  . Stress: Not on file  Relationships  . Social connections:    Talks on phone: Not on file    Gets together: Not on file    Attends religious service: Not on file    Active member of club or organization: Not on file    Attends meetings of clubs or organizations: Not on file    Relationship status: Not on file  . Intimate partner violence:    Fear of current or ex partner: Not on file    Emotionally abused: Not on file    Physically abused: Not on file    Forced sexual activity: Not on file  Other Topics Concern  . Not on file  Social History Narrative  . Not on file     Review of Systems  Constitutional: Negative.   Musculoskeletal: Positive for arthralgias, back pain and gait problem. Negative for joint swelling and myalgias.  Skin: Negative.   All other systems reviewed and are negative.      Objective:    Vitals:   03/29/18 0955  BP: 126/80  Pulse: 64  Temp: 98.7 F (37.1 C)  TempSrc: Oral  SpO2: 99%  Weight:  155 lb 8 oz (70.5 kg)      Physical Exam  Constitutional: She appears well-developed and well-nourished. No distress.  HENT:  Head: Normocephalic and atraumatic.  Nose: Nose normal.  Eyes: Conjunctivae are normal. Right eye exhibits no discharge. Left eye exhibits no discharge.  Neck: Normal range of motion. No tracheal deviation present.  Cardiovascular: Normal rate and regular rhythm.  Pulmonary/Chest: Effort normal. No stridor. No respiratory distress.  Musculoskeletal: Normal range of motion.       Right hip: She exhibits tenderness. She exhibits normal range of motion, normal strength, no bony tenderness, no swelling and no crepitus.       Left hip: She exhibits tenderness. She exhibits normal range of motion, normal strength, no bony tenderness, no swelling and no crepitus.  b/l hip normal active and passive ROM, no crepitus palpated, normal strength to b/l hips internal rotation ttp over greater trochanter b/l resisted external derotation test - negative b/l +FABER test in right hip internal rotation   No thoracic or lumbar midline or paraspinal ttp right SI joint ttp  Neurological: She is alert. She displays no atrophy. No sensory deficit. She exhibits normal muscle tone. Coordination normal.  Mildly antalgic gait  Skin: Skin is warm and dry. No rash noted. She is not diaphoretic.  Psychiatric: She has a normal mood and affect. Her behavior is normal.  Nursing note and vitals reviewed.         Assessment & Plan:      ICD-10-CM   1. Bilateral hip pain M25.551 Ambulatory referral to Physical Therapy   M25.552   Ddx acute on chronic trochanteric bursitis (greater trochanteric pain syndrome) also consider piriformis syndrome, and on right sacroiliitis and/or sciatica.  We will treat with celecoxib, muscle relaxer (mostly for at night), PT referral.  Pt would like a steroid injection to bilateral bursa however do not do the procedure at this clinic and she was referred to  Ortho for that.  Because of some sciatica features and some SI joint tenderness I did offer a treatment of steroids but she has taken steroids recently and it did not improve her symptoms very much.  Discussed that while starting physical therapy if inflammation worsens that steroids may be beneficial and she can let us know.  Re choice of NSAID - Patient has maxed out over-the-counter therapy and is not tolerating NSAIDs, also has a history of intolerance of Mobic, per her report states that she has tried celecoxib in the past (denies allergy although sulfa allergy, no problems when taking "several years" ago) and would like to try it again.  She states she is compliant with her PPI.   No concern for osseous injury, hip arthritis, or infection.  Patient to follow-up as needed.  Is returning 04/10/18 for routine visit with PCP.  Delsa Grana, PA-C 03/29/18 10:17 AM

## 2018-04-09 ENCOUNTER — Encounter: Payer: Self-pay | Admitting: Physical Therapy

## 2018-04-09 ENCOUNTER — Other Ambulatory Visit: Payer: Self-pay

## 2018-04-09 ENCOUNTER — Ambulatory Visit: Payer: 59 | Attending: Family Medicine | Admitting: Physical Therapy

## 2018-04-09 DIAGNOSIS — M5442 Lumbago with sciatica, left side: Secondary | ICD-10-CM | POA: Diagnosis present

## 2018-04-09 DIAGNOSIS — M5441 Lumbago with sciatica, right side: Secondary | ICD-10-CM

## 2018-04-09 DIAGNOSIS — M25551 Pain in right hip: Secondary | ICD-10-CM | POA: Diagnosis present

## 2018-04-09 DIAGNOSIS — R252 Cramp and spasm: Secondary | ICD-10-CM

## 2018-04-09 DIAGNOSIS — M6281 Muscle weakness (generalized): Secondary | ICD-10-CM | POA: Diagnosis present

## 2018-04-09 DIAGNOSIS — M25552 Pain in left hip: Secondary | ICD-10-CM

## 2018-04-09 NOTE — Therapy (Signed)
[place], [place], [address] Phone: [phone]   Fax:  [phone]  Physical Therapy Evaluation  Patient Details  Name: Destiny Ishii, Destiny Graham MRN: [id number] Date of Birth: 1956/06/19 Referring Provider: Delsa Grana  PA   Encounter Date: 04/09/2018  PT End of Session - 04/09/18 1702    Visit Number  1    Number of Visits  12    Date for PT Re-Evaluation  05/21/18    Authorization Type  [facility]    PT Start Time  1550    PT Stop Time  1709    PT Time Calculation (min)  79 min    Activity Tolerance  Patient tolerated treatment well    Behavior During Therapy  [facility] for tasks assessed/performed       Past Medical History:  Diagnosis Date  . Breast nodule   . DDD (degenerative disc disease), cervical   . Dysthymia   . Generalized OA   . GERD (gastroesophageal reflux disease)   . Hallux rigidus   . Heart murmur    beneign  . Hyperlipidemia   . Hypertension   . OSA on CPAP   . Pseudophakia   . Thyroid disease   . Uterine fibroid     Past Surgical History:  Procedure Laterality Date  . BUNIONECTOMY    . CATARACT EXTRACTION  2016  . fusion   2015   first MTP B  . HARDWARE REMOVAL Left 06/15/2017   Procedure: Left Great Toe Hardware Removal;  Surgeon: Newt Minion, Destiny Graham;  Location: [place];  Service: Orthopedics;  Laterality: Left;  . MYOMECTOMY  1992  . ORIF FINGER FRACTURE  2006  . THYROIDECTOMY  2005  . TOENAIL EXCISION Right 06/15/2017   Procedure: Right Great Toe Excision Lateral Border Nail;  Surgeon: Newt Minion, Destiny Graham;  Location: [place];  Service: Orthopedics;  Laterality: Right;  . TONSILLECTOMY  1960    There were no vitals filed for this visit.   Subjective Assessment - 04/09/18 1558    Subjective  I had both my Great toes fused. about 7 years.  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  but I only work 3 days a week but I am having trouble working    Limitations   Standing;Walking    How long can you sit comfortably?  unlimited just getting up from the chair.    How long can you stand comfortably?  1 hour    How long can you walk comfortably?  1/4 mile    Diagnostic tests  none    Patient Stated Goals  Get back to walking 2 miles comfortably.   12 hour work shift in Urgent Care Destiny Graham    Currently in Pain?  Yes    Pain Score  6     Pain Location  Hip    Pain Orientation  Right;Left    Pain Descriptors / Indicators  Aching;Burning    Pain Type  Chronic pain    Pain Onset  More than a month ago    Pain Frequency  Intermittent    Aggravating Factors   unable to sleep on both hips. must sleep on my back , externally rotating hips    Pain Relieving Factors  ice it         [facility] PT Assessment - 04/09/18 1613      Assessment   Medical Diagnosis  bilateral hip pain    Referring  Provider  Delsa Grana  PA    Onset Date/Surgical Date  -- approximately 2 months ago    Hand Dominance  Right    Next Destiny Graham Visit  unknown    Prior Therapy  none for bilateral hips      Precautions   Precautions  None      Restrictions   Weight Bearing Restrictions  No      Balance Screen   Has the patient fallen in the past 6 months  No    Has the patient had a decrease in activity level because of a fear of falling?   No    Is the patient reluctant to leave their home because of a fear of falling?   No      Home Film/video editor residence    [place] to enter    Entrance Stairs-Number of Steps  8    Entrance Stairs-Rails  Can reach both    Home Layout  Two level      Cognition   Overall Cognitive Status  Within Functional Limits for tasks assessed      Observation/Other Assessments   Focus on Therapeutic Outcomes (FOTO)   FOTO intake 69%limitatiion 31% predicted 26%      Posture/Postural Control   Posture/Postural Control  Postural limitations    Postural Limitations  Anterior pelvic tilt   25 degree on right tilt    Posture Comments  right quadratus elevated and anteror inominate rotation on right,      ROM / Strength   AROM / PROM / Strength  AROM;Strength      AROM   Overall AROM   Deficits    Right Hip Extension  5    Right Hip Flexion  140    Right Hip External Rotation   50    Right Hip Internal Rotation   32    Right Hip ABduction  45    Left Hip Extension  8    Left Hip Flexion  140    Left Hip External Rotation   65    Left Hip Internal Rotation   35    Right Ankle Dorsiflexion  12 Big toe Left 15 flex, Right 0    Left Ankle Dorsiflexion  10    Lumbar Flexion  64    Lumbar Extension  18    Lumbar - Right Side Bend  25    Lumbar - Left Side Bend  23      Strength   Overall Strength  Deficits    Right Hip Extension  4+/5    Right Hip ABduction  3-/5    Left Hip Flexion  5/5    Left Hip Extension  4+/5    Left Hip ABduction  3/5    Right Knee Flexion  5/5    Right Knee Extension  5/5    Left Knee Flexion  5/5    Left Knee Extension  5/5      Flexibility   Hamstrings  bil approximately 70    Quadriceps  in Thomas positions left 90 degrees and right 85      Palpation   Palpation comment  tenderness over right SI and right Quadratus lumborum as well as SI joint lines bilaterally right > LEft      Special Tests    Special Tests  Sacrolliac Tests;Leg LengthTest;Hip Special Tests    Leg length test  Apparent      Pelvic Dictraction   Findings  Negative    Comment  bil      Pelvic Compression   Findings  Negative    comment  bil      Sacral thrust    Findings  Positive    Comments  right marked, left minor discomfort      Sacral Compression   Findings  Positive    Comments  right and left      Apparent   Comments  Pt even with long and short sitting      Thomas Test    Findings  Positive    Comments  bil Right more pronounced than Left                Objective measurements completed on examination: See above findings.       [facility] Adult PT Treatment/Exercise - 04/09/18 1613      Self-Care   Self-Care  Other Self-Care Comments    Other Self-Care Comments   TPDN education aftercare and precautians      Knee/Hip Exercises: Public affairs consultant  3 reps;20 seconds    Quad Stretch Limitations  standing bil.  Pt tends to abduct r > L leg with quad stretch    Hip Flexor Stretch  2 reps;30 seconds;Left;Right    Hip Flexor Stretch Limitations  Pt with increased SI on Right pain    Piriformis Stretch  3 reps;30 seconds;Right;Left supine    Piriformis Stretch Limitations  sitting 2 x 30 sec bil    Other Knee/Hip Stretches  childs pose forward 2 x 30 sec and to right 30 sec x 2 and to right 30 sec x 2      Modalities   Modalities  Moist Heat      Moist Heat Therapy   Number Minutes Moist Heat  12 Minutes    Moist Heat Location  Lumbar Spine;Hip right QL      Manual Therapy   Manual Therapy  Joint mobilization    Manual therapy comments  skilled palpation for TPDN    Soft tissue mobilization  bil gluteals    Myofascial Release  right Quadratus Lumborum       Trigger Point Dry Needling - 04/09/18 1633    Consent Given?  Yes    Education Handout Provided  Yes    Muscles Treated Upper Body  Quadratus Lumborum right side only QL  all else bil    Muscles Treated Lower Body  Gluteus minimus;Gluteus maximus;Piriformis    Gluteus Maximus Response  Twitch response elicited;Palpable increased muscle length    Gluteus Minimus Response  Twitch response elicited;Palpable increased muscle length    Piriformis Response  Twitch response elicited;Palpable increased muscle length           PT Education - 04/09/18 1709    Education Details  POC, explanation of findings. HEP for stretches, education on TPDN after care and precautians    Person(s) Educated  Patient    Methods  Explanation;Demonstration;Tactile cues;Verbal cues;Handout    Comprehension  Verbalized understanding;Returned demonstration        PT Short Term Goals - 04/09/18 1824      PT SHORT TERM GOAL #1   Title  STG=LTG        PT Long Term Goals - 04/09/18 1824      PT LONG TERM GOAL #1   Title  "Demonstrate and verbalize techniques to reduce the risk of re-injury including:  lifting, posture, body mechanics.     Time  6    Period  Weeks    Status  New    Target Date  05/21/18      PT LONG TERM GOAL #2   Title  "FOTO will improve from 31%liimitaiton   to  26% limtation  indicating improved functional mobility.     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    Time  6    Period  Weeks    Status  New    Target Date  05/21/18      PT LONG TERM GOAL #6   Title  Pt will be able to utilize self mobilization techniques and pain managment in order to comfortably enjoy leasure and work time activities without distraction    Time  6    Period  Weeks    Status  New    Target Date  05/21/18             Plan - 04/09/18 1709    Clinical Impression Statement  62 yo ED Destiny Graham presents with bil hip pain as well as R SI pain > Left SI and elevated right pelvic level with concurrent right anterior inominate and a 2 month course of increaseing bil hip pain that prevents pt from sleeping on sides.  Pt reports she had bil great toe fusion MT joint 7 years ago and her gait has not been the same and increasing pain and tightness. She complains of feeling like she is constantly flexed  forward. and her anterior chain is indeed tight with bil hip flexor, and bil quad tightness with R > L symptoms.  Pt consented to TPDN and was closely monitored throughout session.  Pt would benefit form skilled PT to address pain, tightness, muscle weakness and abnormal gait caused by fused great toes.     Clinical Presentation  Stable    Clinical Decision Making  Low    Rehab Potential  Excellent    PT Frequency  2x / week    PT Duration  6 weeks    PT Treatment/Interventions  ADLs/Self Care Home Management;Cryotherapy;Electrical Stimulation;Iontophoresis 4mg /ml Dexamethasone;Moist Heat;Ultrasound;Therapeutic exercise;Therapeutic activities;Functional mobility training;Gait training;Stair training;Neuromuscular re-education;Patient/family education;Manual techniques;Passive range of motion;Dry needling;Taping    PT Next Visit Plan  Assess TPDN and SI / hip pain  strengthening    PT Home Exercise Plan  flexibility stretch piriformis, quads, hip flexors and childs pose    Consulted and Agree with Plan of Care  Patient       Patient will benefit from skilled therapeutic intervention in order to improve the following deficits and  impairments:  Abnormal gait, Pain, Postural dysfunction, Increased fascial restricitons, Decreased activity tolerance, Impaired flexibility, Decreased strength, Decreased range of motion, Decreased mobility, Difficulty walking  Visit Diagnosis: Pain in right hip  Pain in left hip  Bilateral low back pain with bilateral sciatica, unspecified chronicity  Muscle weakness (generalized)  Cramp and spasm     Problem List Patient Active Problem List   Diagnosis Date Noted  . Stress fracture of right calcaneus 06/04/2017  . Ingrown nail of great toe of right foot 06/04/2017  . Pain from implanted hardware 06/04/2017  . OSA on CPAP 10/10/2016  . GERD (gastroesophageal reflux disease) 07/26/2016  . DDD (degenerative disc disease), cervical 07/26/2016  . Generalized  OA 07/26/2016  . Hypothyroidism 07/25/2016  . Essential hypertension 07/25/2016  . Hyperlipidemia 07/25/2016  . Dysthymic disorder 07/25/2016   Voncille Lo, PT Certified Exercise Expert for the Aging Adult  04/09/18 6:41 PM Phone: [phone] Fax: [facility] [address] [place], [place], [address] Phone: [phone]   Fax:  [phone]  Name: Destiny Peyser, Destiny Graham MRN: [id number] Date of Birth: [birth year]

## 2018-04-09 NOTE — Patient Instructions (Signed)
Trigger Point Dry Needling  . What is Trigger Point Dry Needling (DN)? o DN is a physical therapy technique used to treat muscle pain and dysfunction. Specifically, DN helps deactivate muscle trigger points (muscle knots).  o A thin filiform needle is used to penetrate the skin and stimulate the underlying trigger point. The goal is for a local twitch response (LTR) to occur and for the trigger point to relax. No medication of any kind is injected during the procedure.   . What Does Trigger Point Dry Needling Feel Like?  o The procedure feels different for each individual patient. Some patients report that they do not actually feel the needle enter the skin and overall the process is not painful. Very mild bleeding may occur. However, many patients feel a deep cramping in the muscle in which the needle was inserted. This is the local twitch response.   Marland Kitchen How Will I feel after the treatment? o Soreness is normal, and the onset of soreness may not occur for a few hours. Typically this soreness does not last longer than two days.  o Bruising is uncommon, however; ice can be used to decrease any possible bruising.  o In rare cases feeling tired or nauseous after the treatment is normal. In addition, your symptoms may get worse before they get better, this period will typically not last longer than 24 hours.   . What Can I do After My Treatment? o Increase your hydration by drinking more water for the next 24 hours. o You may place ice or heat on the areas treated that have become sore, however, do not use heat on inflamed or bruised areas. Heat often brings more relief post needling. o You can continue your regular activities, but vigorous activity is not recommended initially after the treatment for 24 hours. o DN is best combined with other physical therapy such as strengthening, stretching, and other therapies.   Hip Stretch  Put right ankle over left knee. Let right knee fall downward, but keep  ankle in place. Feel the stretch in hip. May push down gently with hand to feel stretch. Hold __30__ seconds while counting out loud. Repeat with other leg. Repeat __3__ times. Do _3___ sessions per day.    Stretching: Piriformis (Supine)  Pull right knee toward opposite shoulder. Hold _30___ seconds. Relax. Repeat _3___ times per set. Do _1___ sets per session. Do _3___ sessions per day.     Copyright  VHI. All rights reserved.  Lower Trunk Rotation Stretch     Kneel, buttocks on heels. Fold upper body forward from hips. Then reach to each side as far as possible, keeping chest low toward floor. Hold each position _30__ seconds. Repeat __3_ times per session. Do __2_ sessions per day.  Copyright  VHI. All rights reserved.       HIP: Flexors - Supine   Lie on edge of surface. Place leg off the surface, allow knee to bend.for extra quad stretch. Bring other knee toward chest. Hold _30__ seconds. __3_ reps per set,   Rest lowered foot on stool.    Quads / HF, Standing   Stand, holding onto chair and grasping one foot with other-side hand. Pull heel toward buttock until stretch is felt in front of thigh. Hold _30__ seconds.  Repeat _3__ times per session. Do _3__ sessions per day. As best  Copyright  VHI. All rights reserved.   Voncille Lo, PT Certified Exercise Expert for the Aging Adult  04/09/18 5:05 PM Phone:  [phone] Fax: [phone]  

## 2018-04-10 ENCOUNTER — Ambulatory Visit: Payer: 59 | Admitting: Family Medicine

## 2018-04-10 ENCOUNTER — Encounter: Payer: Self-pay | Admitting: Family Medicine

## 2018-04-10 ENCOUNTER — Other Ambulatory Visit: Payer: Self-pay

## 2018-04-10 VITALS — BP 128/66 | HR 68 | Temp 98.8°F | Resp 14 | Ht 67.0 in | Wt 153.0 lb

## 2018-04-10 DIAGNOSIS — K219 Gastro-esophageal reflux disease without esophagitis: Secondary | ICD-10-CM

## 2018-04-10 DIAGNOSIS — I10 Essential (primary) hypertension: Secondary | ICD-10-CM

## 2018-04-10 DIAGNOSIS — E890 Postprocedural hypothyroidism: Secondary | ICD-10-CM | POA: Diagnosis not present

## 2018-04-10 DIAGNOSIS — E7800 Pure hypercholesterolemia, unspecified: Secondary | ICD-10-CM | POA: Diagnosis not present

## 2018-04-10 MED ORDER — LEVOTHYROXINE SODIUM 125 MCG PO TABS: 125.0000 ug | ORAL_TABLET | Freq: Every day | ORAL | 3 refills | Status: DC

## 2018-04-10 NOTE — Progress Notes (Signed)
   Subjective:    Patient ID: Destiny Everts, MD, female    DOB: 03-26-1956, 62 y.o.   MRN: [id number]  Patient presents for Follow-up (is not fasting) Pt here to f/u chronic medical problems She was seen recently for hippain, referred to ortho    HYpothyroidism- taking synthroid daily without difficullty   Lipids at goal done Oct 2018, on lipotor   HTN- taking atenolol daily   GERD- doing well on PPI, considering decreasing to zantac due to long term effects  Review Of Systems:  GEN- denies fatigue, fever, weight loss,weakness, recent illness HEENT- denies eye drainage, change in vision, nasal discharge, CVS- denies chest pain, palpitations RESP- denies SOB, cough, wheeze ABD- denies N/V, change in stools, abd pain GU- denies dysuria, hematuria, dribbling, incontinence MSK- denies joint pain, muscle aches, injury Neuro- denies headache, dizziness, syncope, seizure activity       Objective:    BP 128/66   Pulse 68   Temp 98.8 F (37.1 C) (Oral)   Resp 14   Ht 5\' 7"  (1.702 m)   Wt 153 lb (69.4 kg)   SpO2 100%   BMI 23.96 kg/m  GEN- NAD, alert and oriented x3 HEENT- PERRL, EOMI, non injected sclera, pink conjunctiva, MMM, oropharynx clear Neck- Supple, no thyromegaly CVS- RRR, no murmur RESP-CTAB EXT- No edema Pulses- Radial 2+        Assessment & Plan:      Problem List Items Addressed This Visit      Unprioritized   Essential hypertension - Primary    Well controlled no changed      GERD (gastroesophageal reflux disease)    Discussed she can change to Zantac 150mg  BID as a trial       Hyperlipidemia    Continue statin Recheck lipids Oct      Hypothyroidism    Plan to recheck TFT in Oct No change to synthroid      Relevant Medications   levothyroxine (SYNTHROID, LEVOTHROID) 125 MCG tablet      Note: This dictation was prepared with Dragon dictation along with smaller Company secretary. Any transcriptional errors that result from this  process are unintentional.

## 2018-04-10 NOTE — Patient Instructions (Signed)
F/U January Physical Lab visit in October

## 2018-04-11 ENCOUNTER — Encounter: Payer: Self-pay | Admitting: Family Medicine

## 2018-04-11 NOTE — Assessment & Plan Note (Signed)
Well controlled no changed

## 2018-04-11 NOTE — Assessment & Plan Note (Signed)
Continue statin Recheck lipids Oct

## 2018-04-11 NOTE — Assessment & Plan Note (Signed)
Plan to recheck TFT in Oct No change to synthroid

## 2018-04-11 NOTE — Assessment & Plan Note (Signed)
Discussed she can change to Zantac 150mg  BID as a trial

## 2018-04-18 ENCOUNTER — Ambulatory Visit: Payer: 59 | Attending: Family Medicine | Admitting: Physical Therapy

## 2018-04-18 DIAGNOSIS — M5442 Lumbago with sciatica, left side: Secondary | ICD-10-CM | POA: Insufficient documentation

## 2018-04-18 DIAGNOSIS — M5441 Lumbago with sciatica, right side: Secondary | ICD-10-CM

## 2018-04-18 DIAGNOSIS — M6281 Muscle weakness (generalized): Secondary | ICD-10-CM | POA: Diagnosis present

## 2018-04-18 DIAGNOSIS — M25552 Pain in left hip: Secondary | ICD-10-CM

## 2018-04-18 DIAGNOSIS — R252 Cramp and spasm: Secondary | ICD-10-CM

## 2018-04-18 DIAGNOSIS — M25551 Pain in right hip: Secondary | ICD-10-CM

## 2018-04-18 NOTE — Patient Instructions (Signed)
   PELVIC TILT  Lie on back, legs bent. Exhale, tilting top of pelvis back, pubic bone up, to flatten lower back. Inhale, rolling pelvis opposite way, top forward, pubic bone down, arch in back. Repeat __10 x 2__ times. Do __2__ sessions per day. Copyright  VHI. All rights reserved.    Isometric Hold With Pelvic Floor (Hook-Lying)  Lie with hips and knees bent. Slowly inhale, and then exhale. Pull navel toward spine and tighten pelvic floor. Hold for __10_ seconds. Continue to breathe in and out during hold. Rest for _10__ seconds. Repeat __10 x2_ times. Do __2-3_ times a day.   Knee Fold  Lie on back, legs bent, arms by sides. Exhale when exerting. Keep abdominals flat, navel to spine. Repeat __10 x2__ times, alternating legs. Do __2__ sessions per day. If not hurting in low back , peform 100s exercise with head bent and  Knees in table top to engage abdomen. 20 5 breath cycles  Knee Drop  Keep pelvis stable. Without rotating hips, slowly drop knee to side, pause, return to center, bring knee across midline toward opposite hip. Feel obliques engaging. Repeat for ___10_ times each leg.       Bridge   Lie back, legs bent. Inhale, pressing hips up. Keeping ribs in, lengthen lower back. Exhale, rolling down along spine from top. Repeat _15 x2___ times. Do __1-2__ sessions per day.  Engage pelvic tilt before lifting.     Voncille Lo, PT Certified Exercise Expert for the Aging Adult  04/18/18 10:03 AM Phone: [phone] Fax: [phone]  .

## 2018-04-18 NOTE — Therapy (Signed)
[place], [place], [address] Phone: [phone]   Fax:  [phone]  Physical Therapy Treatment  Patient Details  Name: Destiny Loa, MD MRN: [id number] Date of Birth: 1955/10/29 Referring Provider: Delsa Grana  PA   Encounter Date: 04/18/2018  PT End of Session - 04/18/18 0934    Visit Number  1    Number of Visits  12    Date for PT Re-Evaluation  05/21/18    Authorization Type  UMR    PT Start Time  0932    PT Stop Time  1026    PT Time Calculation (min)  54 min    Activity Tolerance  Patient tolerated treatment well    Behavior During Therapy  [facility] for tasks assessed/performed       Past Medical History:  Diagnosis Date  . Breast nodule   . DDD (degenerative disc disease), cervical   . Dysthymia   . Generalized OA   . GERD (gastroesophageal reflux disease)   . Hallux rigidus   . Heart murmur    beneign  . Hyperlipidemia   . Hypertension   . OSA on CPAP   . Pseudophakia   . Thyroid disease   . Uterine fibroid     Past Surgical History:  Procedure Laterality Date  . BUNIONECTOMY    . CATARACT EXTRACTION  2016  . fusion   2015   first MTP B  . HARDWARE REMOVAL Left 06/15/2017   Procedure: Left Great Toe Hardware Removal;  Surgeon: Newt Minion, MD;  Location: [place];  Service: Orthopedics;  Laterality: Left;  . MYOMECTOMY  1992  . ORIF FINGER FRACTURE  2006  . THYROIDECTOMY  2005  . TOENAIL EXCISION Right 06/15/2017   Procedure: Right Great Toe Excision Lateral Border Nail;  Surgeon: Newt Minion, MD;  Location: [place];  Service: Orthopedics;  Laterality: Right;  . TONSILLECTOMY  1960    There were no vitals filed for this visit.  Subjective Assessment - 04/18/18 0936    Subjective  I am more sore the last few days.  I was better the first day after TPDN    Limitations  Standing;Walking    Patient Stated Goals  Get back to walking 2 miles comfortably.   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  Yes    Pain Score  4     Pain Location  Hip    Pain Orientation  Right;Left                       [facility] Adult PT Treatment/Exercise - 04/18/18 0945      Self-Care   Self-Care  Other Self-Care Comments    Other Self-Care Comments   communiity wellness options       Lumbar Exercises: Supine   Ab Set  10 reps    AB Set Limitations  10 sec    Pelvic Tilt  15 reps    Pelvic Tilt Limitations  pain increase with anterior tilt    Clam  3 seconds;10 reps    Clam Limitations  right and left VC and TC    Bent Knee Raise  3 seconds;10 reps    Bent Knee Raise Limitations  pt with 100's and table top with 15 cycles     Bridge  3 seconds;15 reps      Lumbar Exercises: Prone   Other Prone  Lumbar Exercises  attempted prone on elbow and cannot tolerate      Knee/Hip Exercises: Stretches   Hip Flexor Stretch  2 reps;30 seconds;Left;Right    Hip Flexor Stretch Limitations  standing hip flexor with posterior tilt for psoas stretch on chair    Piriformis Stretch  3 reps;30 seconds;Right;Left      Modalities   Modalities  Moist Heat      Moist Heat Therapy   Number Minutes Moist Heat  15 Minutes    Moist Heat Location  Lumbar Spine      Manual Therapy   Manual Therapy  Joint mobilization    Manual therapy comments  skilled palpation for TPDN, MET for Right SI joint with minimal effect on pain    Joint Mobilization  PA mobs T-12 to s-1 and UPA on rigth L-2 to L-5    Soft tissue mobilization  bil gluteals and piriformis     Myofascial Release  --       Trigger Point Dry Needling - 04/18/18 0946    Consent Given?  Yes    Education Handout Provided  No    Muscles Treated Upper Body  --    Muscles Treated Lower Body  Piriformis;Gluteus maximus;Gluteus minimus    Gluteus Maximus Response  Twitch response elicited;Palpable increased muscle length    Gluteus Minimus Response  Twitch response elicited;Palpable increased muscle length     Piriformis Response  Twitch response elicited;Palpable increased muscle length             PT Short Term Goals - 04/09/18 1824      PT SHORT TERM GOAL #1   Title  STG=LTG        PT Long Term Goals - 04/09/18 1824      PT LONG TERM GOAL #1   Title  "Demonstrate and verbalize techniques to reduce the risk of re-injury including: lifting, posture, body mechanics.     Time  6    Period  Weeks    Status  New    Target Date  05/21/18      PT LONG TERM GOAL #2   Title  "FOTO will improve from 31%liimitaiton   to  26% limtation  indicating improved functional mobility.     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  Pt exacerbates pain with asymmetric positions.  Pt with decreased spasms post RX . Pt consented to TPDN and was closely monitored throughout session.  Pt also was introduced to several community wellness  options.  Will continue RX and progress toward goals    Rehab Potential  Excellent    PT Frequency  2x / week    PT Duration  6 weeks    PT Treatment/Interventions  ADLs/Self Care Home Management;Cryotherapy;Electrical Stimulation;Iontophoresis 37m/ml Dexamethasone;Moist Heat;Ultrasound;Therapeutic exercise;Therapeutic activities;Functional mobility training;Gait training;Stair training;Neuromuscular re-education;Patient/family education;Manual techniques;Passive range of motion;Dry needling;Taping    PT Next Visit Plan  Assess TPDN and SI / hip pain  strengthening,  Assess SI right pain bridge with ball squeeze    PT Home Exercise Plan  flexibility stretch piriformis, quads, hip flexors and childs pose, bridging Pre pilates    Consulted and Agree with Plan of Care  Patient       Patient will benefit from skilled therapeutic intervention in order to improve the following deficits and impairments:  Abnormal gait, Pain, Postural dysfunction, Increased fascial restricitons, Decreased activity tolerance, Impaired flexibility, Decreased strength, Decreased range of motion, Decreased mobility, Difficulty walking  Visit Diagnosis: Pain in right hip  Pain in left hip  Bilateral low back pain with bilateral sciatica, unspecified chronicity  Muscle weakness (generalized)  Cramp and spasm     Problem List Patient Active Problem List   Diagnosis Date Noted  . Stress fracture of right calcaneus 06/04/2017  . Ingrown nail of great toe of right foot 06/04/2017  . Pain from implanted hardware 06/04/2017  . OSA on CPAP 10/10/2016  . GERD (gastroesophageal reflux disease) 07/26/2016  . DDD (degenerative disc disease), cervical 07/26/2016  . Generalized OA 07/26/2016  . Hypothyroidism 07/25/2016  . Essential hypertension 07/25/2016  . Hyperlipidemia 07/25/2016  . Dysthymic disorder 07/25/2016    LVoncille Lo PT Certified Exercise Expert for the Aging Adult  04/18/18 1:27 PM Phone:  [phone]: [facility] [place] [address]Phone: [phone]  Fax:  [phone] Name: Destiny Seifer MD MRN: [id number] of Birth: [birth year]

## 2018-04-26 ENCOUNTER — Encounter: Payer: Self-pay | Admitting: Physical Therapy

## 2018-04-26 ENCOUNTER — Ambulatory Visit: Payer: 59 | Admitting: Physical Therapy

## 2018-04-26 DIAGNOSIS — M5441 Lumbago with sciatica, right side: Secondary | ICD-10-CM

## 2018-04-26 DIAGNOSIS — R252 Cramp and spasm: Secondary | ICD-10-CM | POA: Diagnosis not present

## 2018-04-26 DIAGNOSIS — M25552 Pain in left hip: Secondary | ICD-10-CM | POA: Diagnosis not present

## 2018-04-26 DIAGNOSIS — M25551 Pain in right hip: Secondary | ICD-10-CM

## 2018-04-26 DIAGNOSIS — M5442 Lumbago with sciatica, left side: Secondary | ICD-10-CM | POA: Diagnosis not present

## 2018-04-26 DIAGNOSIS — M6281 Muscle weakness (generalized): Secondary | ICD-10-CM

## 2018-04-26 NOTE — Therapy (Addendum)
[place], [place], [address] Phone: [phone]   Fax:  [phone]  Physical Therapy Treatment/Discharge  Patient Details  Name: Destiny Lascano, Destiny Graham MRN: [id number] Date of Birth: [birth year] Referring Provider: Delsa Grana  PA   Encounter Date: 04/26/2018  PT End of Session - 04/26/18 1118    Visit Number  3    Number of Visits  12    Date for PT Re-Evaluation  05/21/18    Authorization Type  UMR    PT Start Time  1116    PT Stop Time  1200    PT Time Calculation (min)  44 min    Activity Tolerance  Patient tolerated treatment well    Behavior During Therapy  [facility] for tasks assessed/performed       Past Medical History:  Diagnosis Date  . Breast nodule   . DDD (degenerative disc disease), cervical   . Dysthymia   . Generalized OA   . GERD (gastroesophageal reflux disease)   . Hallux rigidus   . Heart murmur    beneign  . Hyperlipidemia   . Hypertension   . OSA on CPAP   . Pseudophakia   . Thyroid disease   . Uterine fibroid     Past Surgical History:  Procedure Laterality Date  . BUNIONECTOMY    . CATARACT EXTRACTION  2016  . fusion   2015   first MTP B  . HARDWARE REMOVAL Left 06/15/2017   Procedure: Left Great Toe Hardware Removal;  Surgeon: Newt Minion, Destiny Graham;  Location: [place];  Service: Orthopedics;  Laterality: Left;  . MYOMECTOMY  1992  . ORIF FINGER FRACTURE  2006  . THYROIDECTOMY  2005  . TOENAIL EXCISION Right 06/15/2017   Procedure: Right Great Toe Excision Lateral Border Nail;  Surgeon: Newt Minion, Destiny Graham;  Location: [place];  Service: Orthopedics;  Laterality: Right;  . TONSILLECTOMY  1960    There were no vitals filed for this visit.  Subjective Assessment - 04/26/18 1118    Subjective  I feel discouraged due to increased pain after work yesterday. Lt greater trochanter is more painful today.     Patient Stated Goals  Get back to walking 2 miles comfortably.   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  Yes   when walking                      [facility] Adult PT Treatment/Exercise - 04/26/18 0001      Exercises   Exercises  Knee/Hip      Lumbar Exercises: Supine   AB Set Limitations  cues to reduce excessive movement    Bridge  10 reps   ball bw kneees     Knee/Hip Exercises: Standing   Gait Training  heel strike & ankle control      Knee/Hip Exercises: Sidelying   Clams  x30 each             PT Education - 04/26/18 1206    Education Details  coordinated movement and levels of contraction, gait pattern, HEP    Person(s) Educated  Patient    Methods  Explanation;Demonstration;Tactile cues;Verbal cues;Handout    Comprehension  Verbalized understanding;Returned demonstration;Verbal cues required;Tactile cues required;Need further instruction       PT Short Term Goals - 04/09/18 1824      PT SHORT TERM GOAL #1   Title  STG=LTG        PT Long Term Goals - 04/09/18 1824      PT LONG TERM GOAL #1   Title  "Demonstrate and verbalize techniques to reduce the risk of re-injury including: lifting, posture, body mechanics.     Time  6    Period  Weeks    Status  New    Target Date  05/21/18      PT LONG TERM GOAL #2   Title  "FOTO will improve from 31%liimitaiton   to  26% limtation  indicating improved functional mobility.     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 trigger point noted in Lt piriformis that was TTP. bilateral first MTP fused creating difficulty walking. Notable instability at ankle at heel strike resulting in lateral to medial weight shift through stance phase and valgus pull from proximal musculature. Used mirror for heel strike control.     PT Treatment/Interventions  ADLs/Self Care Home Management;Cryotherapy;Electrical Stimulation;Iontophoresis 17m/ml Dexamethasone;Moist Heat;Ultrasound;Therapeutic exercise;Therapeutic activities;Functional mobility training;Gait training;Stair training;Neuromuscular re-education;Patient/family education;Manual techniques;Passive range of motion;Dry needling;Taping    PT Next Visit Plan  reformer- lumbopelvic strengthening, ankle stability training    PT Home Exercise Plan  flexibility stretch piriformis, quads, hip flexors and childs pose, bridging, clam    Consulted and Agree with Plan of Care  Patient       Patient will benefit from skilled therapeutic intervention in order to improve the following deficits and impairments:  Abnormal gait, Pain, Postural dysfunction, Increased fascial restricitons, Decreased activity tolerance, Impaired flexibility, Decreased strength, Decreased range of motion, Decreased mobility, Difficulty walking  Visit Diagnosis: Pain in right hip  Pain in left hip  Bilateral low back pain with bilateral sciatica, unspecified chronicity  Muscle weakness (generalized)  Cramp and  spasm     Problem List Patient Active Problem List   Diagnosis Date Noted  . Stress fracture of right calcaneus  06/04/2017  . Ingrown nail of great toe of right foot 06/04/2017  . Pain from implanted hardware 06/04/2017  . OSA on CPAP 10/10/2016  . GERD (gastroesophageal reflux disease) 07/26/2016  . DDD (degenerative disc disease), cervical 07/26/2016  . Generalized OA 07/26/2016  . Hypothyroidism 07/25/2016  . Essential hypertension 07/25/2016  . Hyperlipidemia 07/25/2016  . Dysthymic disorder 07/25/2016    Maleaha Hughett C. Linnette Panella PT, DPT 04/26/18 12:07 PM   [place] [facility] [address] [place], [place], [address] Phone: [phone]   Fax:  [phone]  Name: Destiny Cegielski, Destiny Graham MRN: [id number] Date of Birth: [birth year]  PHYSICAL THERAPY DISCHARGE SUMMARY  Visits from Start of Care: 3  Current functional level related to goals / functional outcomes: See above   Remaining deficits: See above   Education / Equipment: Anatomy of condition, POC, HEP, exercise form/[place]  Plan: Patient agrees to discharge.  Patient goals were not met. Patient is being discharged due to not returning since the last visit.  ?????     Adelyn Roscher C. Jeremy Ditullio PT, DPT 06/19/18 11:39 AM

## 2018-07-08 ENCOUNTER — Other Ambulatory Visit: Payer: Self-pay | Admitting: Family Medicine

## 2018-10-07 ENCOUNTER — Other Ambulatory Visit: Payer: Self-pay | Admitting: Family Medicine

## 2018-11-05 ENCOUNTER — Other Ambulatory Visit: Payer: Self-pay | Admitting: Family Medicine

## 2019-01-05 ENCOUNTER — Encounter: Payer: Self-pay | Admitting: Family Medicine

## 2019-01-06 MED ORDER — LEVOTHYROXINE SODIUM 125 MCG PO TABS: 125.0000 ug | ORAL_TABLET | Freq: Every day | ORAL | 3 refills | Status: DC

## 2019-01-06 MED ORDER — OMEPRAZOLE 20 MG PO CPDR: 20.0000 mg | DELAYED_RELEASE_CAPSULE | Freq: Every day | ORAL | 3 refills | Status: DC

## 2019-01-06 MED ORDER — DULOXETINE HCL 60 MG PO CPEP: 60.0000 mg | ORAL_CAPSULE | Freq: Every day | ORAL | 3 refills | Status: DC

## 2019-01-06 MED ORDER — ATORVASTATIN CALCIUM 20 MG PO TABS: 20.0000 mg | ORAL_TABLET | Freq: Every day | ORAL | 3 refills | Status: DC

## 2019-01-06 MED ORDER — ATENOLOL 25 MG PO TABS: 25.0000 mg | ORAL_TABLET | Freq: Every day | ORAL | 3 refills | Status: DC

## 2019-01-06 NOTE — Addendum Note (Signed)
Addended by: Sheral Flow on: 01/06/2019 10:35 AM   Modules accepted: Orders

## 2019-01-07 MED ORDER — ATENOLOL 25 MG PO TABS: 25.0000 mg | ORAL_TABLET | Freq: Every day | ORAL | 0 refills | Status: DC

## 2019-01-07 NOTE — Addendum Note (Signed)
Addended by: Sheral Flow on: 01/07/2019 09:27 AM   Modules accepted: Orders

## 2019-01-30 ENCOUNTER — Other Ambulatory Visit: Payer: Self-pay | Admitting: Family Medicine

## 2019-02-05 ENCOUNTER — Other Ambulatory Visit (HOSPITAL_COMMUNITY): Payer: Self-pay | Admitting: Family Medicine

## 2019-02-05 DIAGNOSIS — Z1231 Encounter for screening mammogram for malignant neoplasm of breast: Secondary | ICD-10-CM

## 2019-02-06 ENCOUNTER — Other Ambulatory Visit: Payer: Self-pay

## 2019-02-06 ENCOUNTER — Ambulatory Visit (HOSPITAL_COMMUNITY)
Admission: RE | Admit: 2019-02-06 | Discharge: 2019-02-06 | Disposition: A | Discharge status: Home / Self Care | Payer: No Typology Code available for payment source | Source: Ambulatory Visit | Attending: Family Medicine | Admitting: Family Medicine

## 2019-02-06 DIAGNOSIS — Z1231 Encounter for screening mammogram for malignant neoplasm of breast: Secondary | ICD-10-CM | POA: Diagnosis present

## 2019-04-07 ENCOUNTER — Telehealth: Payer: Self-pay | Admitting: Family Medicine

## 2019-04-07 ENCOUNTER — Other Ambulatory Visit: Payer: Self-pay

## 2019-04-07 MED ORDER — AMOXICILLIN-POT CLAVULANATE 875-125 MG PO TABS: 1.0000 | ORAL_TABLET | Freq: Two times a day (BID) | ORAL | 0 refills | Status: DC

## 2019-04-07 NOTE — Telephone Encounter (Signed)
Spoke with patient and informed her that medication was sent to pharmacy. Patient verbalized understanding.

## 2019-04-07 NOTE — Telephone Encounter (Signed)
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

## 2019-04-07 NOTE — Telephone Encounter (Signed)
Patient called in stating that she was bit by a cat on her forearm. She has a redness and warmth to site. She is scheduled to see [facility] tomorrow but would like to know if we can send in Augmentin for her to start tonight

## 2019-04-08 ENCOUNTER — Encounter: Payer: Self-pay | Admitting: Family Medicine

## 2019-04-08 ENCOUNTER — Other Ambulatory Visit: Payer: Self-pay | Admitting: Family Medicine

## 2019-04-08 ENCOUNTER — Ambulatory Visit (INDEPENDENT_AMBULATORY_CARE_PROVIDER_SITE_OTHER): Payer: No Typology Code available for payment source | Admitting: Family Medicine

## 2019-04-08 VITALS — BP 122/80 | HR 68 | Temp 98.6°F | Ht 67.0 in

## 2019-04-08 DIAGNOSIS — E890 Postprocedural hypothyroidism: Secondary | ICD-10-CM

## 2019-04-08 DIAGNOSIS — S51831A Puncture wound without foreign body of right forearm, initial encounter: Secondary | ICD-10-CM | POA: Diagnosis not present

## 2019-04-08 DIAGNOSIS — W5501XA Bitten by cat, initial encounter: Secondary | ICD-10-CM | POA: Diagnosis not present

## 2019-04-08 DIAGNOSIS — Z0000 Encounter for general adult medical examination without abnormal findings: Secondary | ICD-10-CM

## 2019-04-08 DIAGNOSIS — Z23 Encounter for immunization: Secondary | ICD-10-CM

## 2019-04-08 DIAGNOSIS — L03113 Cellulitis of right upper limb: Secondary | ICD-10-CM

## 2019-04-08 NOTE — Progress Notes (Signed)
Patient ID: Destiny Everts, MD, female    DOB: [birth year], 63 y.o.   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  She did start antibiotics yesterday, augmentin, and the size and severity of swelling redness and pain is already starting to improve.  She was trying to catch ferrel cats on her property when one of them attacked her.  She has several scratched and few puncture wounds to her right forearm.  Area of redness is roughly 12x10 cm now decreased to about 6x6 cm area and redness is more faint.  Last tetanus was in 2008. No pain with movement of wrist or arm, pain with palpation to red area.  No constitutional sx.    Patient Active Problem List   Diagnosis Date Noted   Stress fracture of right calcaneus 06/04/2017   Ingrown nail of great toe of right foot 06/04/2017   Pain from implanted hardware 06/04/2017   OSA on CPAP 10/10/2016   GERD (gastroesophageal reflux disease) 07/26/2016   DDD (degenerative disc disease), cervical 07/26/2016   Generalized OA 07/26/2016   Hypothyroidism 07/25/2016   Essential hypertension 07/25/2016   Hyperlipidemia 07/25/2016   Dysthymic disorder 07/25/2016     Prior to Admission medications   Medication Sig Start Date End Date Taking? Authorizing Provider  acetaminophen (TYLENOL) 500 MG tablet Take 1,000 mg by mouth every 6 (six) hours as needed for mild pain or headache.    Yes [provider]  amoxicillin-clavulanate (AUGMENTIN) 875-125 MG tablet Take 1 tablet by mouth 2 (two) times daily. 04/07/19  Yes [place], Modena Nunnery, MD  aspirin EC 81 MG tablet Take 81 mg by mouth daily.   Yes [provider]  atenolol (TENORMIN) 25 MG tablet Take 1 tablet (25 mg total) by mouth daily. 01/07/19  Yes [place],  Modena Nunnery, MD  atorvastatin (LIPITOR) 20 MG tablet Take 1 tablet (20 mg total) by mouth daily. 01/06/19  Yes [place], Modena Nunnery, MD  DULoxetine (CYMBALTA) 60 MG capsule Take 1 capsule (60 mg total) by mouth daily. 01/06/19  Yes [place], Modena Nunnery, MD  levothyroxine (SYNTHROID) 125 MCG tablet Take 1 tablet (125 mcg total) by mouth daily. 01/06/19  Yes [place], Modena Nunnery, MD  omeprazole (PRILOSEC) 20 MG capsule Take 1 capsule (20 mg total) by mouth daily. 01/06/19  Yes [place], Modena Nunnery, MD     Allergies  Allergen Reactions   Shingrix [Zoster Vac Recomb Adjuvanted] Other (See Comments)    Localized reaction with fever, hypotension, vertigo   Vioxx [Rofecoxib] Other (See Comments)    Photodermatitis    Sulfa Antibiotics Rash     Family History  Problem Relation Age of Onset   Arthritis Mother    Depression Mother    COPD Father    Hearing loss Father    Heart disease Father    Hyperlipidemia Father    Hypertension Father    Kidney disease Father    Alcohol abuse Father    Depression Sister    Alcohol abuse Sister    Heart disease Maternal Grandmother        CHF   COPD Maternal Grandfather    Alcohol abuse Paternal Grandmother    Depression Sister    Heart disease  Sister    Hyperlipidemia Sister    Depression Sister    Heart disease Sister    Hyperlipidemia Sister      Social History   Socioeconomic History   Marital status: Divorced    Spouse name: Not on file   Number of children: Not on file   Years of education: Not on file   Highest education level: Not on file  Occupational History   Not on file  Social Needs   Financial resource strain: Not on file   Food insecurity    Worry: Not on file    Inability: Not on file   Transportation needs    Medical: Not on file    Non-medical: Not on file  Tobacco Use   Smoking status: Never Smoker   Smokeless tobacco: Never Used  Substance and Sexual Activity   Alcohol use: Yes     Comment: occ.   Drug use: No   Sexual activity: Not Currently  Lifestyle   Physical activity    Days per week: Not on file    Minutes per session: Not on file   Stress: Not on file  Relationships   Social connections    Talks on phone: Not on file    Gets together: Not on file    Attends religious service: Not on file    Active member of club or organization: Not on file    Attends meetings of clubs or organizations: Not on file    Relationship status: Not on file   Intimate partner violence    Fear of current or ex partner: Not on file    Emotionally abused: Not on file    Physically abused: Not on file    Forced sexual activity: Not on file  Other Topics Concern   Not on file  Social History Narrative   Not on file     Review of Systems  Constitutional: Negative.   HENT: Negative.   Eyes: Negative.   Respiratory: Negative.   Cardiovascular: Negative.   Gastrointestinal: Negative.   Endocrine: Negative.   Genitourinary: Negative.   Musculoskeletal: Negative.   Skin: Negative.   Allergic/Immunologic: Negative.   Neurological: Negative.   Hematological: Negative.   Psychiatric/Behavioral: Negative.   All other systems reviewed and are negative.      Objective:    Vitals:   04/08/19 0924  BP: 122/80  Pulse: 68  Temp: 98.6 F (37 C)  TempSrc: Oral  SpO2: 98%  Height: 5\' 7"  (1.702 m)      Physical Exam Vitals signs and nursing note reviewed.  Constitutional:      General: She is not in acute distress.    Appearance: Normal appearance. She is well-developed. She is not ill-appearing, toxic-appearing or diaphoretic.  HENT:     Head: Normocephalic and atraumatic.     Right Ear: External ear normal.     Left Ear: External ear normal.  Eyes:     General:        Right eye: No discharge.        Left eye: No discharge.     Conjunctiva/sclera: Conjunctivae normal.  Neck:     Trachea: No tracheal deviation.  Cardiovascular:     Rate and Rhythm:  Normal rate and regular rhythm.     Pulses: Normal pulses.  Pulmonary:     Effort: Pulmonary effort is normal. No respiratory distress.     Breath sounds: No stridor.  Musculoskeletal: Normal range of motion.  Skin:  General: Skin is warm and dry.     Findings: Erythema and wound present. No rash.     Comments: Right proximal forearm ulnar side with few puncture wounds and scattered abrasions with roughly 6x6 cm area of mild edema and erythema ttp, no purulent drainage, no induration or nodules with palpation of the area, right wrist and elbow with normal ROM, muscle compartments soft to palpation  Neurological:     Mental Status: She is alert.     Motor: No abnormal muscle tone.     Coordination: Coordination normal.  Psychiatric:        Behavior: Behavior normal.           Assessment & Plan:      ICD-10-CM   1. Right forearm cellulitis  L03.113    improving with augmentin, continue abx, follow up as needed  2. 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 Routine general medical examination at a health care facility  Z00.00 T4, Free    T3, Free    TSH    Comprehensive metabolic panel    Lipid Panel    CBC with Differential/Platelet   not seen or done today - labs put in by other staff for future visit with PCP  4. Postoperative hypothyroidism  E89.0 T4, Free    T3, Free    TSH   not seen for or done today - labs put in by other staff for future visit with PCP  5. Puncture wound of right forearm, initial encounter  S51.831A    from ferel cat, pt declined xrays, wounds appear closed, no purulent drainage, no concern for abscess or deep tissue infection, continue abx, tetanus updated  6. Need for tetanus booster  Z23        Delsa Grana, PA-C 04/08/19 9:31 AM

## 2019-04-08 NOTE — Patient Instructions (Addendum)
Please continue taking augmentin with food.  If the area of redness and swelling worsens we would need to see you right away.   You would need rabies shots with in 10 days of cat bite.  Animals can be observed or tested to rule out rabies.  If unable to have that done with animal control - you would need to go to the ER for the shots, or you can ask the county where else carries the shots.  Follow up with Dr. Buelah Manis with your labs and future visits.  Animal Bite, Adult Animal bite wounds can be mild or serious. It is important to get medical treatment to prevent infection. Ask your doctor if you need treatment to prevent an infection that can spread from animals to humans (rabies). Follow these instructions at home: Wound care   Follow instructions from your doctor about how to take care of your wound. Make sure you: ? Wash your hands with soap and water before you change your bandage (dressing). If you cannot use soap and water, use hand sanitizer. ? Change your bandage as told by your doctor. ? Leave stitches (sutures), skin glue, or skin tape (adhesive) strips in place. They may need to stay in place for 2 weeks or longer. If tape strips get loose and curl up, you may trim the loose edges. Do not remove tape strips completely unless your doctor says it is okay.  Check your wound every day for signs of infection. Check for: ? More redness, swelling, or pain. ? More fluid or blood. ? Warmth. ? Pus or a bad smell. Medicines  Take or apply over-the-counter and prescription medicines only as told by your doctor.  If you were prescribed an antibiotic, take or apply it as told by your doctor. Do not stop using the antibiotic even if your wound gets better. General instructions   Keep the injured area raised (elevated) above the level of your heart while you are sitting or lying down.  If directed, put ice on the injured area. ? Put ice in a plastic bag. ? Place a towel between your skin  and the bag. ? Leave the ice on for 20 minutes, 2-3 times per day.  Keep all follow-up visits as told by your doctor. This is important. Contact a doctor if:  You have more redness, swelling, or pain around your wound.  Your wound feels warm to the touch.  You have a fever or chills.  You have a general feeling of sickness (malaise).  You feel sick to your stomach (nauseous).  You throw up (vomit).  You have pain that does not get better. Get help right away if:  You have a red streak going away from your wound.  You have any of these coming from your wound: ? Non-clear fluid. ? More blood. ? Pus or a bad smell.  You have trouble moving your injured area.  You lose feeling (have numbness) or feel tingling anywhere on your body. Summary  It is important to get the right medical treatment for animal bites. Treatment can help you to not get an infection. Ask your doctor if you need treatment to prevent an infection that can spread from animals to humans (rabies).  Check your wound every day for signs of infection, such as more redness or swelling instead of less.  If you have a red streak going away from your wound, get medical help right away. This information is not intended to replace advice given  to you by your health care provider. Make sure you discuss any questions you have with your health care provider. Document Released: 08/28/2005 Document Revised: 08/23/2017 Document Reviewed: 03/08/2017 Elsevier Patient Education  2020 [employer].

## 2019-04-09 ENCOUNTER — Encounter: Payer: Self-pay | Admitting: Family Medicine

## 2019-04-09 LAB — COMPREHENSIVE METABOLIC PANEL
AG Ratio: 2 (calc) (ref 1.0–2.5)
ALT: 20 U/L (ref 6–29)
AST: 29 U/L (ref 10–35)
Albumin: 4.2 g/dL (ref 3.6–5.1)
Alkaline phosphatase (APISO): 55 U/L (ref 37–153)
BUN: 19 mg/dL (ref 7–25)
CO2: 26 mmol/L (ref 20–32)
Calcium: 9.3 mg/dL (ref 8.6–10.4)
Chloride: 102 mmol/L (ref 98–110)
Creat: 0.97 mg/dL (ref 0.50–0.99)
Globulin: 2.1 g/dL (calc) (ref 1.9–3.7)
Glucose, Bld: 104 mg/dL — ABNORMAL HIGH (ref 65–99)
Potassium: 4.6 mmol/L (ref 3.5–5.3)
Sodium: 137 mmol/L (ref 135–146)
Total Bilirubin: 0.3 mg/dL (ref 0.2–1.2)
Total Protein: 6.3 g/dL (ref 6.1–8.1)

## 2019-04-09 LAB — CBC WITH DIFFERENTIAL/PLATELET
Absolute Monocytes: 461 cells/uL (ref 200–950)
Basophils Absolute: 61 cells/uL (ref 0–200)
Basophils Relative: 1.3 %
Eosinophils Absolute: 212 cells/uL (ref 15–500)
Eosinophils Relative: 4.5 %
HCT: 39.4 % (ref 35.0–45.0)
Hemoglobin: 13.2 g/dL (ref 11.7–15.5)
Lymphs Abs: 1537 cells/uL (ref 850–3900)
MCH: 31.6 pg (ref 27.0–33.0)
MCHC: 33.5 g/dL (ref 32.0–36.0)
MCV: 94.3 fL (ref 80.0–100.0)
MPV: 10.2 fL (ref 7.5–12.5)
Monocytes Relative: 9.8 %
Neutro Abs: 2430 cells/uL (ref 1500–7800)
Neutrophils Relative %: 51.7 %
Platelets: 329 10*3/uL (ref 140–400)
RBC: 4.18 10*6/uL (ref 3.80–5.10)
RDW: 13 % (ref 11.0–15.0)
Total Lymphocyte: 32.7 %
WBC: 4.7 10*3/uL (ref 3.8–10.8)

## 2019-04-09 LAB — LIPID PANEL
Cholesterol: 205 mg/dL — ABNORMAL HIGH (ref <200)
HDL: 72 mg/dL (ref >=50)
LDL Cholesterol (Calc): 111 mg/dL (calc) — ABNORMAL HIGH
Non-HDL Cholesterol (Calc): 133 mg/dL (calc) — ABNORMAL HIGH (ref <130)
Total CHOL/HDL Ratio: 2.8 (calc) (ref <5.0)
Triglycerides: 108 mg/dL (ref <150)

## 2019-04-09 LAB — TSH: TSH: 3.31 mIU/L (ref 0.40–4.50)

## 2019-04-09 LAB — T3, FREE: T3, Free: 2.6 pg/mL (ref 2.3–4.2)

## 2019-04-09 LAB — T4, FREE: Free T4: 1.3 ng/dL (ref 0.8–1.8)

## 2019-04-09 MED ORDER — ATORVASTATIN CALCIUM 40 MG PO TABS: 40.0000 mg | ORAL_TABLET | Freq: Every day | ORAL | 1 refills | Status: DC

## 2019-05-21 ENCOUNTER — Ambulatory Visit (INDEPENDENT_AMBULATORY_CARE_PROVIDER_SITE_OTHER): Payer: No Typology Code available for payment source | Admitting: Family Medicine

## 2019-05-21 ENCOUNTER — Encounter: Payer: Self-pay | Admitting: Family Medicine

## 2019-05-21 ENCOUNTER — Other Ambulatory Visit: Payer: Self-pay

## 2019-05-21 VITALS — BP 126/78 | HR 64 | Temp 97.9°F | Resp 14 | Ht 67.0 in | Wt 164.0 lb

## 2019-05-21 DIAGNOSIS — F331 Major depressive disorder, recurrent, moderate: Secondary | ICD-10-CM | POA: Diagnosis not present

## 2019-05-21 DIAGNOSIS — E890 Postprocedural hypothyroidism: Secondary | ICD-10-CM

## 2019-05-21 DIAGNOSIS — I10 Essential (primary) hypertension: Secondary | ICD-10-CM

## 2019-05-21 DIAGNOSIS — R7301 Impaired fasting glucose: Secondary | ICD-10-CM | POA: Diagnosis not present

## 2019-05-21 MED ORDER — DULOXETINE HCL 30 MG PO CPEP: ORAL_CAPSULE | ORAL | 3 refills | Status: DC

## 2019-05-21 MED ORDER — GABAPENTIN 300 MG PO CAPS: 300.0000 mg | ORAL_CAPSULE | Freq: Two times a day (BID) | ORAL | 3 refills | Status: DC

## 2019-05-21 MED ORDER — DULOXETINE HCL 60 MG PO CPEP: 60.0000 mg | ORAL_CAPSULE | Freq: Every day | ORAL | 3 refills | Status: DC

## 2019-05-21 NOTE — Assessment & Plan Note (Signed)
Her blood pressure the past few checks in the past few months have looks good in the office.  I do think that her anxiety is quite high when she is at work this is likely not a good reading with regards to her elevated systolic.  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

## 2019-05-21 NOTE — Assessment & Plan Note (Addendum)
Significant stressors she is working on the front lines in the setting of COVID-19 her sister's health her mother's health multiple changes right now.  I recommended that she call [facility] for counseling just to get things off of her chest.  We will also increase his Cymbalta to 90 mg and she will try gabapentin 300 mg at bedtime to start with.  This can be used for some off label anxiety so she may increase to twice a day in a few weeks if needed.  We will follow-up in about 4 weeks she will contact me via my chart if she has any problems before that visit.

## 2019-05-21 NOTE — Patient Instructions (Addendum)
Start gabapentin 300mg  at bedtime  Increase cymbalta to 90mg   F/U 4 weeks

## 2019-05-21 NOTE — Progress Notes (Signed)
Subjective:    Patient ID: Destiny Everts, MD, female    DOB: 1956/07/31, 63 y.o.   MRN: [id number]  Patient presents for Medication Review/ Refill (is fasting) and Anxiety (increased stress in life and increased anxiety/ depression)  Patient here to follow-up her medications.  She is at increased anxiety and depression symptoms over the past few months.  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  She recently found out that she would not make her yearly bonus because she was not working fast enough but only missed the time limit by a few minutes and this is frustrated her.  They are working on reduced staff even though the numbers have rebounded from [place] which also makes things quite stressful.  She is working very long hours on top of this active her sister was recently diagnosed with gastric cancer and has a very short life expectancy.  Her sister is on the [place] which makes things very difficult as she cannot be with her during this time.  She is also now going to become the primary caregiver for her elderly mother who has dementia she has another sister who is currently looking in on her [place] however she has not been providing what she feels is adequate care.  She is now moved into a new home and is trying to renovate so that she can get her mother with her here in [place].  She feels like she is getting hit from every angle.  She is also having some difficulty with 1 of her daughters they are not communicating well.  She is currently on Cymbalta 60 mg.  She has not been sleeping well she did try taking gabapentin 300 mg at bedtime this did help as she is also quite restless which was even noted on a sleep study years ago that she had multiple body movements throughout the night.  She would like to try this at bedtime but also wanted me to weigh in on her antidepressant dosing.  She has been on multiple antidepressants throughout the years.   She denies any suicide ideation but just feels the heaviness of everything surrounding her does feel quite depressed.  Of note regarding her sister's gastric cancer genetic testing was negative.  He is on atenolol for hypertension she has noted at times that her blood pressure is elevated when she is at work but also at home her systolic will be in the [id number] she is also experienced some episodes of tachycardia on and off but is also per above been quite stressed out.  Review Of Systems:  GEN- + fatigue,denies  fever, weight loss,weakness, recent illness HEENT- denies eye drainage, change in vision, nasal discharge, CVS- denies chest pain, palpitations RESP- denies SOB, cough, wheeze ABD- denies N/V, change in stools, abd pain GU- denies dysuria, hematuria, dribbling, incontinence MSK- denies joint pain, muscle aches, injury Neuro- denies headache, dizziness, syncope, seizure activity       Objective:    BP 126/78   Pulse 64   Temp 97.9 F (36.6 C) (Oral)   Resp 14   Ht 5\' 7"  (1.702 m)   Wt 164 lb (74.4 kg)   SpO2 96%   BMI 25.69 kg/m  GEN- NAD, alert and oriented x3 CVS- RRR, no murmur RESP-CTAB Psych- depressed affect, fair eye contact, tearful, not anxious, normal thought process, no SI, no apparent hallucinations       Assessment &  Plan:      Problem List Items Addressed This Visit      Unprioritized   Elevated fasting glucose    Work on dietary changes  check A1C with lipid panel in a few months      Essential hypertension    Her blood pressure the past few checks in the past few months have looks good in the office.  I do think that her anxiety is quite high when she is at work this is likely not a good reading with regards to her elevated systolic.  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      Hypothyroidism - Primary    Thyroid function study look good no changes to dose at this time.      MDD (major depressive disorder), recurrent episode, moderate ([place])    Significant stressors she is working on the front lines in the setting of COVID-19 her sister's health her mother's health multiple changes right now.  I recommended that she call [facility] for counseling just to get things off of her chest.  We will also increase his Cymbalta to 90 mg and she will try gabapentin 300 mg at bedtime to start with.  This can be used for some off label anxiety so she may increase to twice a day in a few weeks if needed.  We will follow-up in about 4 weeks she will contact me via my chart if she has any problems before that visit.       Relevant Medications   DULoxetine (CYMBALTA) 30 MG capsule   DULoxetine (CYMBALTA) 60 MG capsule      Note: This dictation was prepared with Dragon dictation along with smaller phrase technology. Any transcriptional errors that result from this process are unintentional.

## 2019-05-21 NOTE — Assessment & Plan Note (Signed)
Work on dietary changes  check A1C with lipid panel in a few months

## 2019-05-21 NOTE — Assessment & Plan Note (Signed)
Thyroid function study look good no changes to dose at this time.

## 2019-05-25 ENCOUNTER — Encounter: Payer: Self-pay | Admitting: Family Medicine

## 2019-06-20 ENCOUNTER — Encounter: Payer: Self-pay | Admitting: Family Medicine

## 2019-06-23 MED ORDER — ATORVASTATIN CALCIUM 40 MG PO TABS: 40.0000 mg | ORAL_TABLET | Freq: Every day | ORAL | 1 refills | Status: DC

## 2019-08-27 ENCOUNTER — Encounter: Payer: Self-pay | Admitting: Family Medicine

## 2019-09-24 ENCOUNTER — Encounter: Payer: Self-pay | Admitting: Family Medicine

## 2019-09-25 MED ORDER — GABAPENTIN 300 MG PO CAPS: 300.0000 mg | ORAL_CAPSULE | Freq: Two times a day (BID) | ORAL | 1 refills | Status: DC

## 2019-09-25 MED ORDER — GABAPENTIN 300 MG PO CAPS: 300.0000 mg | ORAL_CAPSULE | Freq: Two times a day (BID) | ORAL | 3 refills | Status: DC

## 2019-12-24 ENCOUNTER — Other Ambulatory Visit: Payer: Self-pay | Admitting: Family Medicine

## 2019-12-24 ENCOUNTER — Encounter: Payer: Self-pay | Admitting: Family Medicine

## 2019-12-25 ENCOUNTER — Other Ambulatory Visit: Payer: Self-pay | Admitting: Family Medicine

## 2019-12-25 MED ORDER — LEVOTHYROXINE SODIUM 125 MCG PO TABS: 125.0000 ug | ORAL_TABLET | Freq: Every day | ORAL | 3 refills | Status: DC

## 2019-12-25 MED ORDER — ATENOLOL 25 MG PO TABS: 25.0000 mg | ORAL_TABLET | Freq: Every day | ORAL | 0 refills | Status: DC

## 2020-01-02 ENCOUNTER — Telehealth (HOSPITAL_COMMUNITY): Payer: Self-pay | Admitting: Family Medicine

## 2020-01-02 MED ORDER — AZITHROMYCIN 250 MG PO TABS: 250.0000 mg | ORAL_TABLET | Freq: Every day | ORAL | 0 refills | Status: DC

## 2020-01-02 MED ORDER — BENZONATATE 200 MG PO CAPS: 200.0000 mg | ORAL_CAPSULE | Freq: Three times a day (TID) | ORAL | 0 refills | Status: AC

## 2020-01-02 NOTE — Telephone Encounter (Signed)
Worsening cough, congestion

## 2020-03-24 ENCOUNTER — Encounter: Payer: Self-pay | Admitting: Family Medicine

## 2020-03-24 ENCOUNTER — Ambulatory Visit (INDEPENDENT_AMBULATORY_CARE_PROVIDER_SITE_OTHER): Payer: No Typology Code available for payment source | Admitting: Family Medicine

## 2020-03-24 ENCOUNTER — Other Ambulatory Visit: Payer: Self-pay

## 2020-03-24 VITALS — BP 120/82 | HR 70 | Temp 98.3°F | Ht 67.0 in | Wt 164.0 lb

## 2020-03-24 DIAGNOSIS — I10 Essential (primary) hypertension: Secondary | ICD-10-CM | POA: Diagnosis not present

## 2020-03-24 DIAGNOSIS — E7800 Pure hypercholesterolemia, unspecified: Secondary | ICD-10-CM | POA: Diagnosis not present

## 2020-03-24 DIAGNOSIS — Z13820 Encounter for screening for osteoporosis: Secondary | ICD-10-CM

## 2020-03-24 DIAGNOSIS — E890 Postprocedural hypothyroidism: Secondary | ICD-10-CM

## 2020-03-24 DIAGNOSIS — R7301 Impaired fasting glucose: Secondary | ICD-10-CM

## 2020-03-24 DIAGNOSIS — Z9989 Dependence on other enabling machines and devices: Secondary | ICD-10-CM

## 2020-03-24 DIAGNOSIS — R221 Localized swelling, mass and lump, neck: Secondary | ICD-10-CM

## 2020-03-24 DIAGNOSIS — G4733 Obstructive sleep apnea (adult) (pediatric): Secondary | ICD-10-CM | POA: Diagnosis not present

## 2020-03-24 DIAGNOSIS — Z1231 Encounter for screening mammogram for malignant neoplasm of breast: Secondary | ICD-10-CM

## 2020-03-24 DIAGNOSIS — N951 Menopausal and female climacteric states: Secondary | ICD-10-CM

## 2020-03-24 DIAGNOSIS — F331 Major depressive disorder, recurrent, moderate: Secondary | ICD-10-CM

## 2020-03-24 DIAGNOSIS — Z23 Encounter for immunization: Secondary | ICD-10-CM

## 2020-03-24 NOTE — Assessment & Plan Note (Signed)
Continue lipitor, goal LDL less than 100 Check labs

## 2020-03-24 NOTE — Progress Notes (Signed)
Subjective:    Patient ID: Destiny Everts, MD, female    DOB: [birth year], 64 y.o.   MRN: [id number]  Patient presents for Medication Refill (Blood pressure check) Patient here to follow-up chronic medical problems.  Medications reviewed.  HTN-blood pressure has been controlled on atenolol 25 mg once a day.  Side effects with the medication  Hyperlipidemia- she is maintained on Lipitor 40 mg once a day  family history of CAD  Hypothyroidism-  taking 125 mcg of levothyroxine no thyroid symptoms.  Elevated fasting glucose on labs in Sept - due for A1C today   MDD- she has been maintained on cymbalta  90mg  once a day    She was also started on gabapentin for anxiety in sept 2020. Medications do help but she has signifcant stressors. Job is stressful, she has mother who has health problems /dementia that she is caring for along with her daughter, her sister is also on palliative care and has been hospitalized for the past month  OSA- she has not used 2019 she would like to resume using, need new sleep medicine referral,machine > 71 years old, testing done out of state    COVID-19 vaccine UTD   Lump on right side of neck beneath mandible  - persistant , not tender for months, it is mobile    She would Like to go ahead and set up mammogram/Bone density  Get PNA vaccine    Review Of Systems:  GEN- +fatigue, denies fever, weight loss,weakness, recent illness HEENT- denies eye drainage, change in vision, nasal discharge, CVS- denies chest pain, palpitations RESP- denies SOB, cough, wheeze ABD- denies N/V, change in stools, abd pain GU- denies dysuria, hematuria, dribbling, incontinence MSK- denies joint pain, muscle aches, injury Neuro- denies headache, dizziness, syncope, seizure activity       Objective:    BP 120/82   Pulse 70   Temp 98.3 F (36.8 C) (Oral)   Ht 5\' 7"  (1.702 m)   Wt 164 lb (74.4 kg)   SpO2 97%   BMI 25.69 kg/m  GEN- NAD, alert and oriented  x3 HEENT- PERRL, EOMI, non injected sclera, pink conjunctiva, MMM, oropharynx clear Neck- Supple, no thyromegaly CVS- RRR, no murmur RESP-CTAB ABD-NABS,soft,NT,ND Psych- depressed affect, no SI, well groomed, good eye contact, normal speech  EXT- No edema Pulses- Radial, DP- 2+        Assessment & Plan:      Problem List Items Addressed This Visit      Unprioritized   Elevated fasting glucose   Relevant Orders   Hemoglobin A1c   Essential hypertension - Primary    Controlled, no change to atenolol       Relevant Orders   CBC with Differential/Platelet   Comprehensive metabolic panel   Hyperlipidemia    Continue lipitor, goal LDL less than 100 Check labs       Relevant Orders   Lipid panel   Hypothyroidism    Recheck TFT, continue replacement  With history of abnormal cells prior to thyroidectomy and now with persistent nodule on right neck Obtain Soft tissue US      Relevant Orders   TSH   T3, free   T4, free   MDD (major depressive disorder), recurrent episode, moderate ([facility])    Multiple stressors from work/family/ caregiver to mother and sister currently in end of life care. Continue cymbalta and gabapentin as prescribed       OSA on CPAP    Referral to sleep  medicine       Relevant Orders   Ambulatory referral to Pulmonology    Other Visit Diagnoses    Neck nodule       DD cyst, lymph node   Relevant Orders   US Soft Tissue Head/Neck (NON-THYROID)   Encounter for screening mammogram for malignant neoplasm of breast       Relevant Orders   MM 3D SCREEN BREAST BILATERAL   Osteoporosis screening       Relevant Orders   DG Bone Density   Post menopausal syndrome       Relevant Orders   DG Bone Density   Need for prophylactic vaccination against Streptococcus pneumoniae (pneumococcus)       Relevant Orders   Pneumococcal conjugate vaccine 13-valent IM (Completed)      Note: This dictation was prepared with Dragon dictation along with  smaller phrase technology. Any transcriptional errors that result from this process are unintentional.

## 2020-03-24 NOTE — Assessment & Plan Note (Signed)
Controlled, no change to atenolol

## 2020-03-24 NOTE — Assessment & Plan Note (Signed)
Multiple stressors from work/family/ caregiver to mother and sister currently in end of life care. Continue cymbalta and gabapentin as prescribed

## 2020-03-24 NOTE — Patient Instructions (Addendum)
Referral to sleep medicine Mammogram/Bone Density- [place] [facility]  F/U 4 MONTHS for Physical

## 2020-03-24 NOTE — Assessment & Plan Note (Signed)
Recheck TFT, continue replacement  With history of abnormal cells prior to thyroidectomy and now with persistent nodule on right neck Obtain Soft tissue US

## 2020-03-24 NOTE — Assessment & Plan Note (Signed)
Referral to sleep medicine

## 2020-03-25 LAB — CBC WITH DIFFERENTIAL/PLATELET
Absolute Monocytes: 445 cells/uL (ref 200–950)
Basophils Absolute: 51 cells/uL (ref 0–200)
Basophils Relative: 0.9 %
Eosinophils Absolute: 148 cells/uL (ref 15–500)
Eosinophils Relative: 2.6 %
HCT: 41.5 % (ref 35.0–45.0)
Hemoglobin: 13.8 g/dL (ref 11.7–15.5)
Lymphs Abs: 1858 cells/uL (ref 850–3900)
MCH: 31.2 pg (ref 27.0–33.0)
MCHC: 33.3 g/dL (ref 32.0–36.0)
MCV: 93.9 fL (ref 80.0–100.0)
MPV: 10 fL (ref 7.5–12.5)
Monocytes Relative: 7.8 %
Neutro Abs: 3198 cells/uL (ref 1500–7800)
Neutrophils Relative %: 56.1 %
Platelets: 321 10*3/uL (ref 140–400)
RBC: 4.42 10*6/uL (ref 3.80–5.10)
RDW: 13.7 % (ref 11.0–15.0)
Total Lymphocyte: 32.6 %
WBC: 5.7 10*3/uL (ref 3.8–10.8)

## 2020-03-25 LAB — COMPREHENSIVE METABOLIC PANEL
AG Ratio: 2.1 (calc) (ref 1.0–2.5)
ALT: 21 U/L (ref 6–29)
AST: 27 U/L (ref 10–35)
Albumin: 4.4 g/dL (ref 3.6–5.1)
Alkaline phosphatase (APISO): 64 U/L (ref 37–153)
BUN: 16 mg/dL (ref 7–25)
CO2: 28 mmol/L (ref 20–32)
Calcium: 9.2 mg/dL (ref 8.6–10.4)
Chloride: 98 mmol/L (ref 98–110)
Creat: 0.98 mg/dL (ref 0.50–0.99)
Globulin: 2.1 g/dL (calc) (ref 1.9–3.7)
Glucose, Bld: 88 mg/dL (ref 65–99)
Potassium: 4.9 mmol/L (ref 3.5–5.3)
Sodium: 136 mmol/L (ref 135–146)
Total Bilirubin: 0.6 mg/dL (ref 0.2–1.2)
Total Protein: 6.5 g/dL (ref 6.1–8.1)

## 2020-03-25 LAB — HEMOGLOBIN A1C
Hgb A1c MFr Bld: 5.5 % of total Hgb (ref <5.7)
Mean Plasma Glucose: 111 (calc)
eAG (mmol/L): 6.2 (calc)

## 2020-03-25 LAB — LIPID PANEL
Cholesterol: 215 mg/dL — ABNORMAL HIGH (ref <200)
HDL: 77 mg/dL (ref >=50)
LDL Cholesterol (Calc): 111 mg/dL (calc) — ABNORMAL HIGH
Non-HDL Cholesterol (Calc): 138 mg/dL (calc) — ABNORMAL HIGH (ref <130)
Total CHOL/HDL Ratio: 2.8 (calc) (ref <5.0)
Triglycerides: 159 mg/dL — ABNORMAL HIGH (ref <150)

## 2020-03-25 LAB — T4, FREE: Free T4: 1.5 ng/dL (ref 0.8–1.8)

## 2020-03-25 LAB — T3, FREE: T3, Free: 2.5 pg/mL (ref 2.3–4.2)

## 2020-03-25 LAB — TSH: TSH: 2.33 mIU/L (ref 0.40–4.50)

## 2020-03-26 ENCOUNTER — Encounter: Payer: Self-pay | Admitting: Family Medicine

## 2020-03-29 ENCOUNTER — Other Ambulatory Visit: Payer: Self-pay | Admitting: Family Medicine

## 2020-03-29 MED ORDER — DULOXETINE HCL 60 MG PO CPEP: 60.0000 mg | ORAL_CAPSULE | Freq: Every day | ORAL | 3 refills | Status: DC

## 2020-03-29 MED ORDER — OMEPRAZOLE 20 MG PO CPDR: 20.0000 mg | DELAYED_RELEASE_CAPSULE | Freq: Every day | ORAL | 3 refills | Status: DC

## 2020-03-29 MED ORDER — LEVOTHYROXINE SODIUM 125 MCG PO TABS: 125.0000 ug | ORAL_TABLET | Freq: Every day | ORAL | 3 refills | Status: DC

## 2020-03-29 MED ORDER — DULOXETINE HCL 30 MG PO CPEP: 30.0000 mg | ORAL_CAPSULE | Freq: Every day | ORAL | 3 refills | Status: DC

## 2020-03-29 MED ORDER — ATENOLOL 25 MG PO TABS: 25.0000 mg | ORAL_TABLET | Freq: Every day | ORAL | 3 refills | Status: DC

## 2020-03-29 MED ORDER — ATORVASTATIN CALCIUM 80 MG PO TABS: 80.0000 mg | ORAL_TABLET | Freq: Every day | ORAL | 3 refills | Status: DC

## 2020-03-29 MED ORDER — GABAPENTIN 300 MG PO CAPS: 300.0000 mg | ORAL_CAPSULE | Freq: Two times a day (BID) | ORAL | 3 refills | Status: DC

## 2020-03-30 MED ORDER — RENOVA 0.02 % EX CREA: TOPICAL_CREAM | CUTANEOUS | 3 refills | Status: DC

## 2020-03-30 NOTE — Addendum Note (Signed)
Addended by: Sheral Flow on: 03/30/2020 07:59 AM   Modules accepted: Orders

## 2020-03-31 ENCOUNTER — Ambulatory Visit (HOSPITAL_COMMUNITY): Payer: Self-pay

## 2020-04-05 ENCOUNTER — Other Ambulatory Visit: Payer: Self-pay | Admitting: Family Medicine

## 2020-04-05 MED ORDER — TRETINOIN 0.05 % EX CREA: TOPICAL_CREAM | Freq: Every day | CUTANEOUS | 3 refills | Status: DC

## 2020-04-05 NOTE — Addendum Note (Signed)
Addended by: Sheral Flow on: 04/05/2020 08:11 AM   Modules accepted: Orders

## 2020-04-13 ENCOUNTER — Encounter: Payer: Self-pay | Admitting: Family Medicine

## 2020-04-14 ENCOUNTER — Ambulatory Visit (HOSPITAL_COMMUNITY)
Admission: RE | Admit: 2020-04-14 | Discharge: 2020-04-14 | Disposition: A | Discharge status: Home / Self Care | Payer: No Typology Code available for payment source | Source: Ambulatory Visit | Attending: Family Medicine | Admitting: Family Medicine

## 2020-04-14 ENCOUNTER — Telehealth: Payer: Self-pay | Admitting: Family Medicine

## 2020-04-14 ENCOUNTER — Other Ambulatory Visit: Payer: Self-pay

## 2020-04-14 DIAGNOSIS — Z13820 Encounter for screening for osteoporosis: Secondary | ICD-10-CM | POA: Insufficient documentation

## 2020-04-14 DIAGNOSIS — R221 Localized swelling, mass and lump, neck: Secondary | ICD-10-CM | POA: Insufficient documentation

## 2020-04-14 DIAGNOSIS — N951 Menopausal and female climacteric states: Secondary | ICD-10-CM | POA: Diagnosis present

## 2020-04-14 DIAGNOSIS — K118 Other diseases of salivary glands: Secondary | ICD-10-CM

## 2020-04-14 DIAGNOSIS — Z1231 Encounter for screening mammogram for malignant neoplasm of breast: Secondary | ICD-10-CM | POA: Insufficient documentation

## 2020-04-14 NOTE — Telephone Encounter (Signed)
Spoke with pt given results of [place] CT neck to be done  Based on results ENT- [place] [facility] vs IR biopsy

## 2020-04-16 ENCOUNTER — Encounter: Payer: Self-pay | Admitting: Family Medicine

## 2020-04-16 ENCOUNTER — Other Ambulatory Visit: Payer: Self-pay

## 2020-04-16 ENCOUNTER — Ambulatory Visit
Admission: RE | Admit: 2020-04-16 | Discharge: 2020-04-16 | Disposition: A | Discharge status: Home / Self Care | Payer: No Typology Code available for payment source | Source: Ambulatory Visit | Attending: Family Medicine | Admitting: Family Medicine

## 2020-04-16 DIAGNOSIS — K118 Other diseases of salivary glands: Secondary | ICD-10-CM

## 2020-04-16 MED ORDER — IOPAMIDOL (ISOVUE-300) INJECTION 61%
75.0000 mL | Freq: Once | INTRAVENOUS | Status: AC | PRN
  Administered 2020-04-16: 75 mL via INTRAVENOUS

## 2020-04-19 ENCOUNTER — Encounter: Payer: Self-pay | Admitting: Family Medicine

## 2020-04-21 ENCOUNTER — Encounter: Payer: Self-pay | Admitting: Family Medicine

## 2020-04-21 ENCOUNTER — Other Ambulatory Visit: Payer: Self-pay | Admitting: Otolaryngology

## 2020-05-03 ENCOUNTER — Encounter: Payer: Self-pay | Admitting: Family Medicine

## 2020-05-11 NOTE — Progress Notes (Signed)
Your procedure is scheduled on Friday, September 3rd.  Report to [facility] Main Entrance "A" at 8:15 A.M., and check in at the Admitting office.  Call this number if you have problems the morning of surgery:  [phone]  Call [phone] if you have any questions prior to your surgery date Monday-Friday 8am-4pm   Remember:  Do not eat or drink after midnight the night before your surgery    Take these medicines the morning of surgery with A SIP OF WATER atenolol (TENORMIN) atorvastatin (LIPITOR) DULoxetine (CYMBALTA)  levothyroxine (SYNTHROID) omeprazole (PRILOSEC)   IF NEEDED: acetaminophen (TYLENOL)   Follow your surgeon's instructions on when to stop Aspirin.  If no instructions were given by your surgeon then you will need to call the office to get those instructions.    As of today, STOP taking Aleve, Naproxen, Ibuprofen, Motrin, Advil, Goody's, BC's, all herbal medications, fish oil, and all vitamins.                     Do not wear jewelry, make up, or nail polish            Do not wear lotions, powders, perfumes, or deodorant.            Do not shave 48 hours prior to surgery.             Do not bring valuables to the hospital.            [facility] is not responsible for any belongings or valuables.  Do NOT Smoke (Tobacco/Vaping) or drink Alcohol 24 hours prior to your procedure If you use a CPAP at night, you may bring all equipment for your overnight stay.   Contacts, glasses, dentures or bridgework may not be worn into surgery.      For patients admitted to the hospital, discharge time will be determined by your treatment team.   Patients discharged the day of surgery will not be allowed to drive home, and someone needs to stay with them for 24 hours.  Special instructions:   [facility]- Preparing For Surgery  Before surgery, you can play an important role. Because skin is not sterile, your skin needs to be as free of germs as possible. You can reduce the  number of germs on your skin by washing with CHG (chlorahexidine gluconate) Soap before surgery.  CHG is an antiseptic cleaner which kills germs and bonds with the skin to continue killing germs even after washing.    Oral Hygiene is also important to reduce your risk of infection.  Remember - BRUSH YOUR TEETH THE MORNING OF SURGERY WITH YOUR REGULAR TOOTHPASTE  Please do not use if you have an allergy to CHG or antibacterial soaps. If your skin becomes reddened/irritated stop using the CHG.  Do not shave (including legs and underarms) for at least 48 hours prior to first CHG shower. It is OK to shave your face.  Please follow these instructions carefully.   1. Shower the NIGHT BEFORE SURGERY and the MORNING OF SURGERY with CHG Soap.   2. If you chose to wash your hair, wash your hair first as usual with your normal shampoo.  3. After you shampoo, rinse your hair and body thoroughly to remove the shampoo.  4. Use CHG as you would any other liquid soap. You can apply CHG directly to the skin and wash gently with a scrungie or a clean washcloth.   5. Apply the CHG Soap to  your body ONLY FROM THE NECK DOWN.  Do not use on open wounds or open sores. Avoid contact with your eyes, ears, mouth and genitals (private parts). Wash Face and genitals (private parts)  with your normal soap.   6. Wash thoroughly, paying special attention to the area where your surgery will be performed.  7. Thoroughly rinse your body with warm water from the neck down.  8. DO NOT shower/wash with your normal soap after using and rinsing off the CHG Soap.  9. Pat yourself dry with a CLEAN TOWEL.  10. Wear CLEAN PAJAMAS to bed the night before surgery  11. Place CLEAN SHEETS on your bed the night of your first shower and DO NOT SLEEP WITH PETS.  Day of Surgery: Wear Clean/Comfortable clothing the morning of surgery Do not apply any deodorants/lotions.   Remember to brush your teeth WITH YOUR REGULAR TOOTHPASTE.    Please read over the following fact sheets that you were given.

## 2020-05-12 ENCOUNTER — Inpatient Hospital Stay (HOSPITAL_COMMUNITY): Admission: RE | Admit: 2020-05-12 | Payer: No Typology Code available for payment source | Source: Ambulatory Visit

## 2020-05-12 ENCOUNTER — Other Ambulatory Visit: Payer: Self-pay

## 2020-05-12 ENCOUNTER — Encounter (HOSPITAL_COMMUNITY): Payer: Self-pay

## 2020-05-12 ENCOUNTER — Other Ambulatory Visit (HOSPITAL_COMMUNITY)
Admission: RE | Admit: 2020-05-12 | Discharge: 2020-05-12 | Disposition: A | Discharge status: Home / Self Care | Payer: No Typology Code available for payment source | Source: Ambulatory Visit | Attending: Otolaryngology | Admitting: Otolaryngology

## 2020-05-12 ENCOUNTER — Encounter (HOSPITAL_COMMUNITY)
Admission: RE | Admit: 2020-05-12 | Discharge: 2020-05-12 | Disposition: A | Discharge status: Home / Self Care | Payer: No Typology Code available for payment source | Source: Ambulatory Visit | Attending: Otolaryngology | Admitting: Otolaryngology

## 2020-05-12 DIAGNOSIS — Z20822 Contact with and (suspected) exposure to covid-19: Secondary | ICD-10-CM | POA: Diagnosis not present

## 2020-05-12 DIAGNOSIS — K219 Gastro-esophageal reflux disease without esophagitis: Secondary | ICD-10-CM | POA: Diagnosis not present

## 2020-05-12 DIAGNOSIS — G4730 Sleep apnea, unspecified: Secondary | ICD-10-CM | POA: Diagnosis not present

## 2020-05-12 DIAGNOSIS — D110 Benign neoplasm of parotid gland: Secondary | ICD-10-CM | POA: Diagnosis not present

## 2020-05-12 DIAGNOSIS — Z9849 Cataract extraction status, unspecified eye: Secondary | ICD-10-CM | POA: Diagnosis not present

## 2020-05-12 DIAGNOSIS — K118 Other diseases of salivary glands: Secondary | ICD-10-CM | POA: Diagnosis present

## 2020-05-12 DIAGNOSIS — Z887 Allergy status to serum and vaccine status: Secondary | ICD-10-CM | POA: Diagnosis not present

## 2020-05-12 DIAGNOSIS — Z8249 Family history of ischemic heart disease and other diseases of the circulatory system: Secondary | ICD-10-CM | POA: Diagnosis not present

## 2020-05-12 DIAGNOSIS — M159 Polyosteoarthritis, unspecified: Secondary | ICD-10-CM | POA: Diagnosis not present

## 2020-05-12 DIAGNOSIS — G4733 Obstructive sleep apnea (adult) (pediatric): Secondary | ICD-10-CM | POA: Diagnosis not present

## 2020-05-12 DIAGNOSIS — R011 Cardiac murmur, unspecified: Secondary | ICD-10-CM | POA: Diagnosis not present

## 2020-05-12 DIAGNOSIS — E890 Postprocedural hypothyroidism: Secondary | ICD-10-CM | POA: Diagnosis not present

## 2020-05-12 DIAGNOSIS — Z01818 Encounter for other preprocedural examination: Secondary | ICD-10-CM | POA: Insufficient documentation

## 2020-05-12 DIAGNOSIS — Z79899 Other long term (current) drug therapy: Secondary | ICD-10-CM | POA: Diagnosis not present

## 2020-05-12 DIAGNOSIS — Z8261 Family history of arthritis: Secondary | ICD-10-CM | POA: Diagnosis not present

## 2020-05-12 DIAGNOSIS — Z7982 Long term (current) use of aspirin: Secondary | ICD-10-CM | POA: Diagnosis not present

## 2020-05-12 DIAGNOSIS — Z825 Family history of asthma and other chronic lower respiratory diseases: Secondary | ICD-10-CM | POA: Diagnosis not present

## 2020-05-12 DIAGNOSIS — Z818 Family history of other mental and behavioral disorders: Secondary | ICD-10-CM | POA: Diagnosis not present

## 2020-05-12 DIAGNOSIS — Z961 Presence of intraocular lens: Secondary | ICD-10-CM | POA: Diagnosis not present

## 2020-05-12 DIAGNOSIS — Z882 Allergy status to sulfonamides status: Secondary | ICD-10-CM | POA: Diagnosis not present

## 2020-05-12 DIAGNOSIS — E785 Hyperlipidemia, unspecified: Secondary | ICD-10-CM | POA: Diagnosis not present

## 2020-05-12 DIAGNOSIS — Z8349 Family history of other endocrine, nutritional and metabolic diseases: Secondary | ICD-10-CM | POA: Diagnosis not present

## 2020-05-12 DIAGNOSIS — M5030 Other cervical disc degeneration, unspecified cervical region: Secondary | ICD-10-CM | POA: Diagnosis not present

## 2020-05-12 DIAGNOSIS — F329 Major depressive disorder, single episode, unspecified: Secondary | ICD-10-CM | POA: Diagnosis not present

## 2020-05-12 DIAGNOSIS — F341 Dysthymic disorder: Secondary | ICD-10-CM | POA: Diagnosis not present

## 2020-05-12 DIAGNOSIS — I10 Essential (primary) hypertension: Secondary | ICD-10-CM | POA: Diagnosis not present

## 2020-05-12 LAB — CBC
HCT: 39.2 % (ref 36.0–46.0)
Hemoglobin: 12.7 g/dL (ref 12.0–15.0)
MCH: 30.8 pg (ref 26.0–34.0)
MCHC: 32.4 g/dL (ref 30.0–36.0)
MCV: 95.1 fL (ref 80.0–100.0)
Platelets: 303 10*3/uL (ref 150–400)
RBC: 4.12 MIL/uL (ref 3.87–5.11)
RDW: 13.1 % (ref 11.5–15.5)
WBC: 4.4 10*3/uL (ref 4.0–10.5)
nRBC: 0 % (ref 0.0–0.2)

## 2020-05-12 LAB — BASIC METABOLIC PANEL
Anion gap: 8 (ref 5–15)
BUN: 6 mg/dL — ABNORMAL LOW (ref 8–23)
CO2: 29 mmol/L (ref 22–32)
Calcium: 9.2 mg/dL (ref 8.9–10.3)
Chloride: 100 mmol/L (ref 98–111)
Creatinine, Ser: 0.89 mg/dL (ref 0.44–1.00)
GFR calc Af Amer: >60 mL/min (ref >60)
GFR calc non Af Amer: >60 mL/min (ref >60)
Glucose, Bld: 109 mg/dL — ABNORMAL HIGH (ref 70–99)
Potassium: 4.1 mmol/L (ref 3.5–5.1)
Sodium: 137 mmol/L (ref 135–145)

## 2020-05-12 LAB — SARS CORONAVIRUS 2 (TAT 6-24 HRS): SARS Coronavirus 2: NEGATIVE

## 2020-05-12 NOTE — Progress Notes (Signed)
PCP - Dr. Buelah Manis- Owens Shark Sumit  Cardiologist - Denies  Chest x-ray - Denies  EKG - 05/12/20  Stress Test - Denies  ECHO - Denies  Cardiac Cath - Denies  AICD- na PM- na LOOP- na  Sleep Study - Yes- Positive CPAP - Denies  LABS- 05/12/20: CBC, BMP, COVID  ASA- LD- 1.5 weeks ago  ERAS- No  HA1C- Denies  Anesthesia- No  Pt denies having chest pain, sob, or fever at this time. All instructions explained to the pt, with a verbal understanding of the material. Pt agrees to go over the instructions while at home for a better understanding. Pt also instructed to self quarantine after being tested for COVID-19. The opportunity to ask questions was provided.   Coronavirus Screening  Have you experienced the following symptoms:  Cough yes/no: No Fever (>100.61F)  yes/no: No Runny nose yes/no: No Sore throat yes/no: No Difficulty breathing/shortness of breath  yes/no: No  Have you or a family member traveled in the last 14 days and where? yes/no: No   If the patient indicates "YES" to the above questions, their PAT will be rescheduled to limit the exposure to others and, the surgeon will be notified. THE PATIENT WILL NEED TO BE ASYMPTOMATIC FOR 14 DAYS.   If the patient is not experiencing any of these symptoms, the PAT nurse will instruct them to NOT bring anyone with them to their appointment since they may have these symptoms or traveled as well.   Please remind your patients and families that hospital visitation restrictions are in effect and the importance of the restrictions.

## 2020-05-12 NOTE — Progress Notes (Signed)
Your procedure is scheduled on Friday, September 3rd.             Report to [facility] Main Entrance "A" at 8:15 A.M., and check in at the Admitting office.             Call this number if you have problems the morning of surgery:             [phone]  Call [phone] if you have any questions prior to your surgery date Monday-Friday 8am-4pm              Remember:             Do not eat or drink after midnight the night before your surgery                          Take these medicines the morning of surgery with A SIP OF WATER atenolol (TENORMIN) atorvastatin (LIPITOR) DULoxetine (CYMBALTA)  levothyroxine (SYNTHROID) omeprazole (PRILOSEC)  IF NEEDED: acetaminophen (TYLENOL)   Follow your surgeon's instructions on when to stop Aspirin.  If no instructions were given by your surgeon then you will need to call the office to get those instructions.    As of today, STOP taking Aleve, Naproxen, Ibuprofen, Motrin, Advil, Goody's, BC's, all herbal medications, fish oil, and all vitamins.  Do not wear jewelry, make up, or nail polish Do notwear lotions, powders, perfumes, or deodorant. Do notshave 48 hours prior to surgery.  Do notbring valuables to the hospital. [facility] is not responsible for any belongings or valuables.  Do NOT Smoke (Tobacco/Vaping) or drink Alcohol 24 hours prior to your procedure If you use a CPAP at night, you may bring all equipment for your overnight stay.  Contacts, glasses, dentures or bridgework may not be worn into surgery.    For patients admitted to the hospital, discharge time will be determined by your treatment team.  Patients discharged the day of surgery will not be allowed to drive home, and someone needs to stay with them for 24 hours.  Special instructions:   [facility]- Preparing For Surgery  Before surgery, you can play an important role. Because skin is not  sterile, your skin needs to be as free of germs as possible. You can reduce the number of germs on your skin by washing with CHG (chlorahexidine gluconate) Soap before surgery.  CHG is an antiseptic cleaner which kills germs and bonds with the skin to continue killing germs even after washing.    Please do not use if you have an allergy to CHG or antibacterial soaps. If your skin becomes reddened/irritated stop using the CHG.  Do not shave (including legs and underarms) for at least 48 hours prior to first CHG shower. It is OK to shave your face.  Please follow these instructions carefully.  1. Shower the NIGHT BEFORE SURGERY and the MORNING OF SURGERY with CHG Soap.   2. If you chose to wash your hair, wash your hair first as usual with your normal shampoo.  3. After you shampoo, rinse your hair and body thoroughly to remove the shampoo.  4. Use CHG as you would any other liquid soap. You can apply CHG directly to the skin and wash gently with a scrungie or a clean washcloth.   5. Apply the CHG Soap to your body ONLY FROM THE NECK DOWN.  Do not use on open wounds or open sores. Avoid contact with your eyes, ears, mouth and genitals (private parts). Wash Face and genitals (private parts)  with your normal soap.   6. Wash thoroughly, paying special attention to the area where your surgery will be performed.  7. Thoroughly rinse your body with warm water from the neck down.  8. DO NOT shower/wash with your normal soap after using and rinsing off the CHG Soap.  9. Pat yourself dry with a CLEAN TOWEL.  10. Wear CLEAN PAJAMAS to bed the night before surgery  11. Place CLEAN SHEETS on your bed the night of your first shower and DO NOT SLEEP WITH PETS.  Day of Surgery: Wear Clean/Comfortable clothing the morning of surgery Do notapply any  deodorants/lotions.  Remember to brush your teeth WITH YOUR REGULAR TOOTHPASTE.  Please read over the following fact sheets that you were given.

## 2020-05-14 ENCOUNTER — Ambulatory Visit (HOSPITAL_COMMUNITY)
Admission: RE | Admit: 2020-05-14 | Discharge: 2020-05-15 | Disposition: A | Discharge status: Home / Self Care | Payer: No Typology Code available for payment source | Attending: Otolaryngology | Admitting: Otolaryngology

## 2020-05-14 ENCOUNTER — Other Ambulatory Visit: Payer: Self-pay

## 2020-05-14 ENCOUNTER — Encounter (HOSPITAL_COMMUNITY)
Admission: RE | Disposition: A | Discharge status: Home / Self Care | Payer: Self-pay | Source: Home / Self Care | Attending: Otolaryngology

## 2020-05-14 ENCOUNTER — Ambulatory Visit (HOSPITAL_COMMUNITY): Payer: No Typology Code available for payment source | Admitting: Anesthesiology

## 2020-05-14 ENCOUNTER — Encounter (HOSPITAL_COMMUNITY): Payer: Self-pay | Admitting: Otolaryngology

## 2020-05-14 DIAGNOSIS — I10 Essential (primary) hypertension: Secondary | ICD-10-CM | POA: Insufficient documentation

## 2020-05-14 DIAGNOSIS — Z825 Family history of asthma and other chronic lower respiratory diseases: Secondary | ICD-10-CM | POA: Insufficient documentation

## 2020-05-14 DIAGNOSIS — F329 Major depressive disorder, single episode, unspecified: Secondary | ICD-10-CM | POA: Insufficient documentation

## 2020-05-14 DIAGNOSIS — Z79899 Other long term (current) drug therapy: Secondary | ICD-10-CM | POA: Insufficient documentation

## 2020-05-14 DIAGNOSIS — Z8349 Family history of other endocrine, nutritional and metabolic diseases: Secondary | ICD-10-CM | POA: Insufficient documentation

## 2020-05-14 DIAGNOSIS — Z20822 Contact with and (suspected) exposure to covid-19: Secondary | ICD-10-CM | POA: Insufficient documentation

## 2020-05-14 DIAGNOSIS — G4733 Obstructive sleep apnea (adult) (pediatric): Secondary | ICD-10-CM | POA: Insufficient documentation

## 2020-05-14 DIAGNOSIS — D110 Benign neoplasm of parotid gland: Secondary | ICD-10-CM | POA: Diagnosis not present

## 2020-05-14 DIAGNOSIS — E785 Hyperlipidemia, unspecified: Secondary | ICD-10-CM | POA: Insufficient documentation

## 2020-05-14 DIAGNOSIS — G4730 Sleep apnea, unspecified: Secondary | ICD-10-CM | POA: Insufficient documentation

## 2020-05-14 DIAGNOSIS — R011 Cardiac murmur, unspecified: Secondary | ICD-10-CM | POA: Insufficient documentation

## 2020-05-14 DIAGNOSIS — Z818 Family history of other mental and behavioral disorders: Secondary | ICD-10-CM | POA: Insufficient documentation

## 2020-05-14 DIAGNOSIS — K219 Gastro-esophageal reflux disease without esophagitis: Secondary | ICD-10-CM | POA: Insufficient documentation

## 2020-05-14 DIAGNOSIS — Z887 Allergy status to serum and vaccine status: Secondary | ICD-10-CM | POA: Insufficient documentation

## 2020-05-14 DIAGNOSIS — M159 Polyosteoarthritis, unspecified: Secondary | ICD-10-CM | POA: Insufficient documentation

## 2020-05-14 DIAGNOSIS — K118 Other diseases of salivary glands: Secondary | ICD-10-CM | POA: Diagnosis present

## 2020-05-14 DIAGNOSIS — F341 Dysthymic disorder: Secondary | ICD-10-CM | POA: Insufficient documentation

## 2020-05-14 DIAGNOSIS — M5030 Other cervical disc degeneration, unspecified cervical region: Secondary | ICD-10-CM | POA: Insufficient documentation

## 2020-05-14 DIAGNOSIS — Z882 Allergy status to sulfonamides status: Secondary | ICD-10-CM | POA: Insufficient documentation

## 2020-05-14 DIAGNOSIS — Z8261 Family history of arthritis: Secondary | ICD-10-CM | POA: Insufficient documentation

## 2020-05-14 DIAGNOSIS — Z961 Presence of intraocular lens: Secondary | ICD-10-CM | POA: Insufficient documentation

## 2020-05-14 DIAGNOSIS — Z841 Family history of disorders of kidney and ureter: Secondary | ICD-10-CM | POA: Insufficient documentation

## 2020-05-14 DIAGNOSIS — Z8249 Family history of ischemic heart disease and other diseases of the circulatory system: Secondary | ICD-10-CM | POA: Insufficient documentation

## 2020-05-14 DIAGNOSIS — Z7982 Long term (current) use of aspirin: Secondary | ICD-10-CM | POA: Insufficient documentation

## 2020-05-14 DIAGNOSIS — E890 Postprocedural hypothyroidism: Secondary | ICD-10-CM | POA: Insufficient documentation

## 2020-05-14 DIAGNOSIS — Z811 Family history of alcohol abuse and dependence: Secondary | ICD-10-CM | POA: Insufficient documentation

## 2020-05-14 DIAGNOSIS — Z9849 Cataract extraction status, unspecified eye: Secondary | ICD-10-CM | POA: Insufficient documentation

## 2020-05-14 HISTORY — PX: PAROTIDECTOMY: SHX2163

## 2020-05-14 SURGERY — EXCISION, PAROTID GLAND
Anesthesia: General | Site: Neck | Laterality: Right

## 2020-05-14 MED ORDER — SUGAMMADEX SODIUM 200 MG/2ML IV SOLN: INTRAVENOUS | Status: DC | PRN

## 2020-05-14 MED ORDER — ATORVASTATIN CALCIUM 80 MG PO TABS
80.0000 mg | ORAL_TABLET | Freq: Every day | ORAL | Status: DC
  Administered 2020-05-14: 80 mg via ORAL
  Filled 2020-05-14: qty 1

## 2020-05-14 MED ORDER — ATENOLOL 25 MG PO TABS
25.0000 mg | ORAL_TABLET | Freq: Every day | ORAL | Status: DC
  Administered 2020-05-15: 25 mg via ORAL
  Filled 2020-05-14: qty 1

## 2020-05-14 MED ORDER — LEVOTHYROXINE SODIUM 125 MCG PO TABS
125.0000 ug | ORAL_TABLET | Freq: Every day | ORAL | Status: DC
  Administered 2020-05-15: 125 ug via ORAL
  Filled 2020-05-14: qty 1

## 2020-05-14 MED ORDER — FENTANYL CITRATE (PF) 250 MCG/5ML IJ SOLN
INTRAMUSCULAR | Status: AC
  Filled 2020-05-14: qty 5

## 2020-05-14 MED ORDER — DULOXETINE HCL 30 MG PO CPEP
90.0000 mg | ORAL_CAPSULE | Freq: Every day | ORAL | Status: DC
  Administered 2020-05-15: 90 mg via ORAL
  Filled 2020-05-14: qty 3

## 2020-05-14 MED ORDER — MIDAZOLAM HCL 2 MG/2ML IJ SOLN
INTRAMUSCULAR | Status: DC | PRN
  Administered 2020-05-14: 2 mg via INTRAVENOUS

## 2020-05-14 MED ORDER — ZOLPIDEM TARTRATE 5 MG PO TABS: 5.0000 mg | ORAL_TABLET | Freq: Every evening | ORAL | Status: DC | PRN

## 2020-05-14 MED ORDER — HYDROMORPHONE HCL 1 MG/ML IJ SOLN
INTRAMUSCULAR | Status: AC
  Filled 2020-05-14: qty 1

## 2020-05-14 MED ORDER — LACTATED RINGERS IV SOLN: INTRAVENOUS | Status: DC

## 2020-05-14 MED ORDER — ACETAMINOPHEN 500 MG PO TABS: 1000.0000 mg | ORAL_TABLET | Freq: Once | ORAL | Status: DC

## 2020-05-14 MED ORDER — ARTIFICIAL TEARS OPHTHALMIC OINT
TOPICAL_OINTMENT | OPHTHALMIC | Status: DC | PRN
  Administered 2020-05-14: 1 via OPHTHALMIC

## 2020-05-14 MED ORDER — LIDOCAINE 2% (20 MG/ML) 5 ML SYRINGE
INTRAMUSCULAR | Status: AC
  Filled 2020-05-14: qty 5

## 2020-05-14 MED ORDER — MIDAZOLAM HCL 2 MG/2ML IJ SOLN
INTRAMUSCULAR | Status: AC
  Filled 2020-05-14: qty 2

## 2020-05-14 MED ORDER — ORAL CARE MOUTH RINSE: 15.0000 mL | Freq: Once | OROMUCOSAL | Status: AC

## 2020-05-14 MED ORDER — CHLORHEXIDINE GLUCONATE 0.12 % MT SOLN
15.0000 mL | Freq: Once | OROMUCOSAL | Status: AC
  Administered 2020-05-14: 15 mL via OROMUCOSAL
  Filled 2020-05-14: qty 15

## 2020-05-14 MED ORDER — LACTATED RINGERS IV SOLN: INTRAVENOUS | Status: DC | PRN

## 2020-05-14 MED ORDER — PROMETHAZINE HCL 25 MG/ML IJ SOLN: 6.2500 mg | INTRAMUSCULAR | Status: DC | PRN

## 2020-05-14 MED ORDER — LIDOCAINE-EPINEPHRINE 1 %-1:100000 IJ SOLN
INTRAMUSCULAR | Status: AC
  Filled 2020-05-14: qty 1

## 2020-05-14 MED ORDER — DEXTROSE IN LACTATED RINGERS 5 % IV SOLN: INTRAVENOUS | Status: DC

## 2020-05-14 MED ORDER — GLYCOPYRROLATE 0.2 MG/ML IJ SOLN
INTRAMUSCULAR | Status: DC | PRN
  Administered 2020-05-14: .2 mg via INTRAVENOUS

## 2020-05-14 MED ORDER — HYDROCODONE-ACETAMINOPHEN 5-325 MG PO TABS
1.0000 | ORAL_TABLET | ORAL | Status: DC | PRN
  Administered 2020-05-14 – 2020-05-15 (×2): 2 via ORAL
  Filled 2020-05-14 (×2): qty 2

## 2020-05-14 MED ORDER — ONDANSETRON HCL 4 MG/2ML IJ SOLN
INTRAMUSCULAR | Status: AC
  Filled 2020-05-14: qty 2

## 2020-05-14 MED ORDER — FENTANYL CITRATE (PF) 250 MCG/5ML IJ SOLN
INTRAMUSCULAR | Status: DC | PRN
  Administered 2020-05-14 (×2): 50 ug via INTRAVENOUS
  Administered 2020-05-14: 100 ug via INTRAVENOUS

## 2020-05-14 MED ORDER — OXYCODONE HCL 5 MG/5ML PO SOLN: 5.0000 mg | Freq: Once | ORAL | Status: AC | PRN

## 2020-05-14 MED ORDER — MEPERIDINE HCL 25 MG/ML IJ SOLN: 6.2500 mg | INTRAMUSCULAR | Status: DC | PRN

## 2020-05-14 MED ORDER — IBUPROFEN 100 MG/5ML PO SUSP
400.0000 mg | Freq: Four times a day (QID) | ORAL | Status: DC | PRN
  Filled 2020-05-14: qty 20

## 2020-05-14 MED ORDER — ONDANSETRON HCL 4 MG/2ML IJ SOLN
INTRAMUSCULAR | Status: DC | PRN
  Administered 2020-05-14: 4 mg via INTRAVENOUS

## 2020-05-14 MED ORDER — ACETAMINOPHEN 325 MG PO TABS: 650.0000 mg | ORAL_TABLET | Freq: Four times a day (QID) | ORAL | Status: DC | PRN

## 2020-05-14 MED ORDER — PROPOFOL 500 MG/50ML IV EMUL
INTRAVENOUS | Status: DC | PRN
  Administered 2020-05-14: 25 ug/kg/min via INTRAVENOUS

## 2020-05-14 MED ORDER — MIDAZOLAM HCL 2 MG/2ML IJ SOLN: 0.5000 mg | Freq: Once | INTRAMUSCULAR | Status: DC | PRN

## 2020-05-14 MED ORDER — CEFAZOLIN SODIUM-DEXTROSE 2-4 GM/100ML-% IV SOLN
2.0000 g | INTRAVENOUS | Status: AC
  Administered 2020-05-14: 2 g via INTRAVENOUS
  Filled 2020-05-14: qty 100

## 2020-05-14 MED ORDER — OXYCODONE HCL 5 MG PO TABS
ORAL_TABLET | ORAL | Status: AC
  Filled 2020-05-14: qty 1

## 2020-05-14 MED ORDER — HYDROCODONE-ACETAMINOPHEN 5-325 MG PO TABS
1.0000 | ORAL_TABLET | Freq: Four times a day (QID) | ORAL | 0 refills | Status: AC | PRN
Start: 2020-05-14 — End: 2020-05-19

## 2020-05-14 MED ORDER — DULOXETINE HCL 60 MG PO CPEP: 60.0000 mg | ORAL_CAPSULE | Freq: Every day | ORAL | Status: DC

## 2020-05-14 MED ORDER — GABAPENTIN 300 MG PO CAPS
600.0000 mg | ORAL_CAPSULE | Freq: Every day | ORAL | Status: DC
  Administered 2020-05-14: 600 mg via ORAL
  Filled 2020-05-14: qty 2

## 2020-05-14 MED ORDER — HYDROMORPHONE HCL 1 MG/ML IJ SOLN
0.2500 mg | INTRAMUSCULAR | Status: DC | PRN
  Administered 2020-05-14: 0.25 mg via INTRAVENOUS

## 2020-05-14 MED ORDER — 0.9 % SODIUM CHLORIDE (POUR BTL) OPTIME
TOPICAL | Status: DC | PRN
  Administered 2020-05-14: 1000 mL

## 2020-05-14 MED ORDER — DEXAMETHASONE SODIUM PHOSPHATE 10 MG/ML IJ SOLN
INTRAMUSCULAR | Status: DC | PRN
  Administered 2020-05-14: 10 mg via INTRAVENOUS

## 2020-05-14 MED ORDER — LIDOCAINE 2% (20 MG/ML) 5 ML SYRINGE
INTRAMUSCULAR | Status: DC | PRN
  Administered 2020-05-14: 30 mg via INTRAVENOUS

## 2020-05-14 MED ORDER — PHENYLEPHRINE 40 MCG/ML (10ML) SYRINGE FOR IV PUSH (FOR BLOOD PRESSURE SUPPORT)
PREFILLED_SYRINGE | INTRAVENOUS | Status: DC | PRN
  Administered 2020-05-14: 200 ug via INTRAVENOUS
  Administered 2020-05-14: 120 ug via INTRAVENOUS

## 2020-05-14 MED ORDER — PROPOFOL 10 MG/ML IV BOLUS
INTRAVENOUS | Status: AC
  Filled 2020-05-14: qty 20

## 2020-05-14 MED ORDER — ONDANSETRON HCL 4 MG PO TABS: 4.0000 mg | ORAL_TABLET | ORAL | Status: DC | PRN

## 2020-05-14 MED ORDER — PROPOFOL 10 MG/ML IV BOLUS
INTRAVENOUS | Status: DC | PRN
  Administered 2020-05-14: 150 mg via INTRAVENOUS

## 2020-05-14 MED ORDER — LIDOCAINE-EPINEPHRINE 1 %-1:100000 IJ SOLN
INTRAMUSCULAR | Status: DC | PRN
  Administered 2020-05-14: 4 mL

## 2020-05-14 MED ORDER — PHENYLEPHRINE HCL-NACL 10-0.9 MG/250ML-% IV SOLN
INTRAVENOUS | Status: DC | PRN
  Administered 2020-05-14: 30 ug/min via INTRAVENOUS

## 2020-05-14 MED ORDER — TRETINOIN 0.05 % EX CREA: TOPICAL_CREAM | Freq: Every day | CUTANEOUS | Status: DC

## 2020-05-14 MED ORDER — ALBUMIN HUMAN 5 % IV SOLN: INTRAVENOUS | Status: DC | PRN

## 2020-05-14 MED ORDER — ONDANSETRON HCL 4 MG/2ML IJ SOLN: 4.0000 mg | INTRAMUSCULAR | Status: DC | PRN

## 2020-05-14 MED ORDER — PANTOPRAZOLE SODIUM 40 MG PO TBEC
40.0000 mg | DELAYED_RELEASE_TABLET | Freq: Every day | ORAL | Status: DC
  Administered 2020-05-15: 40 mg via ORAL
  Filled 2020-05-14: qty 1

## 2020-05-14 MED ORDER — EPHEDRINE SULFATE 50 MG/ML IJ SOLN
INTRAMUSCULAR | Status: DC | PRN
  Administered 2020-05-14 (×2): 10 mg via INTRAVENOUS
  Administered 2020-05-14 (×2): 5 mg via INTRAVENOUS

## 2020-05-14 MED ORDER — ACETAMINOPHEN 500 MG PO TABS: 1000.0000 mg | ORAL_TABLET | Freq: Four times a day (QID) | ORAL | Status: DC | PRN

## 2020-05-14 MED ORDER — OXYCODONE HCL 5 MG PO TABS
5.0000 mg | ORAL_TABLET | Freq: Once | ORAL | Status: AC | PRN
  Administered 2020-05-14: 5 mg via ORAL

## 2020-05-14 MED ORDER — DEXAMETHASONE SODIUM PHOSPHATE 10 MG/ML IJ SOLN
INTRAMUSCULAR | Status: AC
  Filled 2020-05-14: qty 1

## 2020-05-14 MED ORDER — DROPERIDOL 2.5 MG/ML IJ SOLN
1.2500 mg | Freq: Once | INTRAMUSCULAR | Status: AC
  Administered 2020-05-14: .625 mg via INTRAVENOUS
  Filled 2020-05-14: qty 2

## 2020-05-14 SURGICAL SUPPLY — 57 items
ATTRACTOMAT 16X20 MAGNETIC DRP (DRAPES) ×2 IMPLANT
BLADE CLIPPER SURG (BLADE) IMPLANT
BLADE SURG 15 STRL LF DISP TIS (BLADE) ×1 IMPLANT
BLADE SURG 15 STRL SS (BLADE) ×1
CABLE BIPOLOR RESECTION CORD (MISCELLANEOUS) ×2 IMPLANT
CANISTER SUCT 3000ML PPV (MISCELLANEOUS) ×2 IMPLANT
CLEANER TIP ELECTROSURG 2X2 (MISCELLANEOUS) ×2 IMPLANT
CNTNR URN SCR LID CUP LEK RST (MISCELLANEOUS) ×1 IMPLANT
CONT SPEC 4OZ STRL OR WHT (MISCELLANEOUS) ×1
COVER SURGICAL LIGHT HANDLE (MISCELLANEOUS) ×2 IMPLANT
DERMABOND ADVANCED (GAUZE/BANDAGES/DRESSINGS) ×1
DERMABOND ADVANCED .7 DNX12 (GAUZE/BANDAGES/DRESSINGS) ×1 IMPLANT
DRAIN JACKSON RD 7FR 3/32 (WOUND CARE) ×2 IMPLANT
DRAIN PENROSE 1/4X12 LTX STRL (WOUND CARE) IMPLANT
DRAIN SNY 10 ROU (WOUND CARE) IMPLANT
DRAPE HALF SHEET 40X57 (DRAPES) IMPLANT
DRAPE SURG 17X23 STRL (DRAPES) ×2 IMPLANT
DRSG TEGADERM 2-3/8X2-3/4 SM (GAUZE/BANDAGES/DRESSINGS) ×10 IMPLANT
ELECT COATED BLADE 2.86 ST (ELECTRODE) ×2 IMPLANT
ELECT PAIRED SUBDERMAL (MISCELLANEOUS) ×2
ELECT REM PT RETURN 9FT ADLT (ELECTROSURGICAL) ×2
ELECTRODE PAIRED SUBDERMAL (MISCELLANEOUS) ×1 IMPLANT
ELECTRODE REM PT RTRN 9FT ADLT (ELECTROSURGICAL) ×1 IMPLANT
EVACUATOR SILICONE 100CC (DRAIN) ×2 IMPLANT
FORCEPS BIPOLAR SPETZLER 8 1.0 (NEUROSURGERY SUPPLIES) ×2 IMPLANT
GAUZE 4X4 16PLY RFD (DISPOSABLE) ×2 IMPLANT
GLOVE BIOGEL M 7.0 STRL (GLOVE) ×4 IMPLANT
GLOVE BIOGEL PI IND STRL 7.0 (GLOVE) ×2 IMPLANT
GLOVE BIOGEL PI INDICATOR 7.0 (GLOVE) ×2
GLOVE ECLIPSE 7.0 STRL STRAW (GLOVE) ×2 IMPLANT
GLOVE SS BIOGEL STRL SZ 7 (GLOVE) ×1 IMPLANT
GLOVE SUPERSENSE BIOGEL SZ 7 (GLOVE) ×1
GLOVE SURG SS PI 7.0 STRL IVOR (GLOVE) ×2 IMPLANT
GOWN STRL REUS W/ TWL LRG LVL3 (GOWN DISPOSABLE) ×3 IMPLANT
GOWN STRL REUS W/TWL LRG LVL3 (GOWN DISPOSABLE) ×3
KIT BASIN OR (CUSTOM PROCEDURE TRAY) ×2 IMPLANT
KIT TURNOVER KIT B (KITS) ×2 IMPLANT
NEEDLE HYPO 25GX1X1/2 BEV (NEEDLE) ×2 IMPLANT
NS IRRIG 1000ML POUR BTL (IV SOLUTION) ×2 IMPLANT
PAD ARMBOARD 7.5X6 YLW CONV (MISCELLANEOUS) ×4 IMPLANT
PENCIL SMOKE EVACUATOR (MISCELLANEOUS) ×2 IMPLANT
PROBE NERVBE PRASS .33 (MISCELLANEOUS) ×2 IMPLANT
SHEARS HARMONIC 9CM CVD (BLADE) ×2 IMPLANT
SPONGE INTESTINAL PEANUT (DISPOSABLE) ×2 IMPLANT
STAPLER VISISTAT 35W (STAPLE) ×2 IMPLANT
SUT ETHILON 3 0 PS 1 (SUTURE) IMPLANT
SUT ETHILON 5 0 P 3 18 (SUTURE)
SUT ETHILON 6 0 P 1 (SUTURE) IMPLANT
SUT NYLON ETHILON 5-0 P-3 1X18 (SUTURE) IMPLANT
SUT SILK 2 0 PERMA HAND 18 BK (SUTURE) IMPLANT
SUT SILK 2 0 REEL (SUTURE) ×2 IMPLANT
SUT SILK 2 0 SH CR/8 (SUTURE) ×2 IMPLANT
SUT SILK 3 0 REEL (SUTURE) ×2 IMPLANT
SUT VIC AB 5-0 P-3 18XBRD (SUTURE) ×1 IMPLANT
SUT VIC AB 5-0 P3 18 (SUTURE) ×1
SUT VICRYL 4-0 PS2 18IN ABS (SUTURE) ×2 IMPLANT
TRAY ENT MC OR (CUSTOM PROCEDURE TRAY) ×2 IMPLANT

## 2020-05-14 NOTE — Anesthesia Preprocedure Evaluation (Addendum)
Anesthesia Evaluation  Patient identified by MRN, date of birth, ID band Patient awake    Reviewed: Allergy & Precautions, NPO status , Patient's Chart, lab work & pertinent test results, reviewed documented beta blocker date and time   History of Anesthesia Complications Negative for: history of anesthetic complications  Airway Mallampati: II  TM Distance: >3 FB Neck ROM: Full    Dental  (+) Dental Advisory Given, Chipped   Pulmonary sleep apnea (does not use CPAP) ,  05/12/2020 SARS coronavirus NEG   breath sounds clear to auscultation       Cardiovascular hypertension, Pt. on medications and Pt. on home beta blockers (-) angina Rhythm:Regular Rate:Normal     Neuro/Psych Depression negative neurological ROS     GI/Hepatic Neg liver ROS, GERD  Medicated and Controlled,  Endo/Other  Hypothyroidism   Renal/GU negative Renal ROS     Musculoskeletal   Abdominal   Peds  Hematology negative hematology ROS (+)   Anesthesia Other Findings   Reproductive/Obstetrics                            Anesthesia Physical Anesthesia Plan  ASA: III  Anesthesia Plan: General   Post-op Pain Management:    Induction: Intravenous  PONV Risk Score and Plan: 3 and Ondansetron, Dexamethasone and Droperidol  Airway Management Planned: Oral ETT  Additional Equipment: None  Intra-op Plan:   Post-operative Plan: Extubation in OR  Informed Consent: I have reviewed the patients History and Physical, chart, labs and discussed the procedure including the risks, benefits and alternatives for the proposed anesthesia with the patient or authorized representative who has indicated his/her understanding and acceptance.     Dental advisory given  Plan Discussed with: CRNA and Surgeon  Anesthesia Plan Comments:        Anesthesia Quick Evaluation

## 2020-05-14 NOTE — H&P (Signed)
Destiny Graham is an 64 y.o. female.   Chief Complaint: Right parotid mass HPI: History of enlarging right parotid mass.  CT scan shows intraparotid mass consistent with possible pleomorphic adenoma.  No lymphadenopathy or other finding.  Past Medical History:  Diagnosis Date  . Breast nodule   . DDD (degenerative disc disease), cervical   . Dysthymia   . Generalized OA   . GERD (gastroesophageal reflux disease)   . Hallux rigidus   . Heart murmur    beneign  . Hyperlipidemia   . Hypertension   . OSA on CPAP    Does not use a cpap  . Pseudophakia   . Thyroid disease   . Uterine fibroid     Past Surgical History:  Procedure Laterality Date  . BUNIONECTOMY    . CATARACT EXTRACTION  2016  . EYE SURGERY    . fusion   2015   first MTP B  . HARDWARE REMOVAL Left 06/15/2017   Procedure: Left Great Toe Hardware Removal;  Surgeon: Newt Minion, MD;  Location: [facility];  Service: Orthopedics;  Laterality: Left;  . MYOMECTOMY  1992  . ORIF FINGER FRACTURE  2006  . THYROIDECTOMY  2005  . TOENAIL EXCISION Right 06/15/2017   Procedure: Right Great Toe Excision Lateral Border Nail;  Surgeon: Newt Minion, MD;  Location: [facility];  Service: Orthopedics;  Laterality: Right;  . TONSILLECTOMY  1960    Family History  Problem Relation Age of Onset  . Arthritis Mother   . Depression Mother   . COPD Father   . Hearing loss Father   . Heart disease Father   . Hyperlipidemia Father   . Hypertension Father   . Kidney disease Father   . Alcohol abuse Father   . Depression Sister   . Alcohol abuse Sister   . Heart disease Maternal Grandmother        CHF  . COPD Maternal Grandfather   . Alcohol abuse Paternal Grandmother   . Depression Sister   . Heart disease Sister   . Hyperlipidemia Sister   . Depression Sister   . Heart disease Sister   . Hyperlipidemia Sister    Social History:  reports that she has never smoked. She has never used smokeless tobacco. She reports current alcohol  use. She reports that she does not use drugs.  Allergies:  Allergies  Allergen Reactions  . Shingrix [Zoster Vac Recomb Adjuvanted] Other (See Comments)    Localized reaction with fever, hypotension, vertigo  . Vioxx [Rofecoxib] Other (See Comments)    Photodermatitis   . Sulfa Antibiotics Rash    Medications Prior to Admission  Medication Sig Dispense Refill  . acetaminophen (TYLENOL) 500 MG tablet Take 1,000 mg by mouth every 6 (six) hours as needed for mild pain or headache.     Marland Kitchen aspirin EC 81 MG tablet Take 81 mg by mouth daily.    Marland Kitchen atenolol (TENORMIN) 25 MG tablet Take 1 tablet (25 mg total) by mouth daily. 90 tablet 3  . atorvastatin (LIPITOR) 80 MG tablet Take 1 tablet (80 mg total) by mouth daily. 90 tablet 3  . DULoxetine (CYMBALTA) 30 MG capsule Take 1 capsule (30 mg total) by mouth daily. Take in addition to 60mg  to equal 90mg  QD. 90 capsule 3  . DULoxetine (CYMBALTA) 60 MG capsule Take 1 capsule (60 mg total) by mouth daily. Take in addition to 30mg  to equal 90mg  QD. 90 capsule 3  . gabapentin (NEURONTIN) 300  MG capsule Take 1 capsule (300 mg total) by mouth 2 (two) times daily. (Patient taking differently: Take 600 mg by mouth at bedtime. ) 180 capsule 3  . levothyroxine (SYNTHROID) 125 MCG tablet Take 1 tablet (125 mcg total) by mouth daily. 90 tablet 3  . omeprazole (PRILOSEC) 20 MG capsule Take 1 capsule (20 mg total) by mouth daily. 90 capsule 3  . tretinoin (RETIN-A) 0.05 % cream Apply topically at bedtime. 45 g 3    Results for orders placed or performed during the hospital encounter of 05/12/20 (from the past 48 hour(s))  SARS CORONAVIRUS 2 (TAT 6-24 HRS) Nasopharyngeal Nasopharyngeal Swab     Status: None   Collection Time: 05/12/20  2:23 PM   Specimen: Nasopharyngeal Swab  Result Value Ref Range   SARS Coronavirus 2 NEGATIVE NEGATIVE    Comment: (NOTE) SARS-CoV-2 target nucleic acids are NOT DETECTED.  The SARS-CoV-2 RNA is generally detectable in upper and  lower respiratory specimens during the acute phase of infection. Negative results do not preclude SARS-CoV-2 infection, do not rule out co-infections with other pathogens, and should not be used as the sole basis for treatment or other patient management decisions. Negative results must be combined with clinical observations, patient history, and epidemiological information. The expected result is Negative.  Fact Sheet for Patients: SugarRoll.be  Fact Sheet for Healthcare Providers: https://www.woods-mathews.com/  This test is not yet approved or cleared by the [place] FDA and  has been authorized for detection and/or diagnosis of SARS-CoV-2 by FDA under an Emergency Use Authorization (EUA). This EUA will remain  in effect (meaning this test can be used) for the duration of the COVID-19 declaration under Se ction 564(b)(1) of the Act, 21 U.S.C. section 360bbb-3(b)(1), unless the authorization is terminated or revoked sooner.  Performed at [facility], [place] [address] [place], [place] [address]    No results found.  Review of Systems  Constitutional: Negative.   HENT: Positive for facial swelling.   Respiratory: Negative.   Cardiovascular: Negative.     Blood pressure (!) 141/78, pulse 61, temperature 98.5 F (36.9 C), temperature source Oral, resp. rate 18, height 5\' 7"  (1.702 m), weight 72.5 kg, SpO2 99 %. Physical Exam Constitutional:      Appearance: She is normal weight.  HENT:     Head:     Comments: Right parotid mass, normal facial nerve function. Cardiovascular:     Rate and Rhythm: Normal rate.     Pulses: Normal pulses.  Pulmonary:     Effort: Pulmonary effort is normal.  Neurological:     Mental Status: She is alert.      Assessment/Plan Patient admitted for right superficial parotidectomy with NIMS monitor under general anesthesia as an outpatient.  Jerrell Belfast, MD 05/14/2020, 10:19 AM

## 2020-05-14 NOTE — Op Note (Signed)
Operative Note: PAROTIDECTOMY  Patient: Destiny Graham  Medical record number: [id number]  Date:05/14/2020  Pre-operative Indications: Right parotid Mass  Postoperative Indications: Same  Surgical Procedure: Right superficial Parotidectomy with NIMS Monitoring  Anesthesia: GET  Surgeon: Delsa Bern, M.D.  Assist: Ewell Poe, RNFA  Ewell Poe, RNFA assistance was required throughout the surgical procedure including retraction, management of bleeding and incision closure throughout the operation.  Complications: None  EBL: Less than 50 cc   Brief History: The patient is a 64 y.o. female with a history of right parotid mass.  Patient followed with a gradually enlarging mass involving the right parotid gland.  CT scan showed circumscribed mass within the right parotid gland consistent with a possible pleomorphic adenoma.  No abnormal adenopathy or other finding.. Given the patient's history and findings I recommended right parotidectomy with nerve monitoring under general anesthesia.  The  risks and benefits were discussed in detail with the patient and their family. They understand and agree with our plan for surgery which is scheduled at [facility] on an elective basis.  Surgical Procedure: The patient is brought to the operating room on 05/14/2020 and placed in supine position on the operating table. General endotracheal anesthesia was established without difficulty. When the patient was adequately anesthetized, surgical timeout was performed and correct identification of the patient and the surgical procedure.  The patient was injected with 4 cc of 1% lidocaine 1-100,000 dilution epinephrine.  The [employer] Nerve Integrity Monitoring System (NIMS) was placed and nerve monitoring was used throughout the facial nerve dissection component of the surgical procedure.  The patient was positioned and prepped and draped in sterile fashion.  With the patient prepped and positioned,  right superficial parotidectomy was undertaken.  A curvilinear incision was created in the preauricular skin crease and carried inferiorly around the earlobe and into the upper neck in a pre-existing skin crease using a #15 scalpel.  Subcutaneous soft tissue was then dissected and incised with Bovie electrocautery to the level of the superficial parotid fascia.  Dissection was carried along the superficial fascia from posterior to anterior elevating skin and muscle layer.  Dissection was then carried along the tragal cartilage from superficial to deep elevating the parotid gland anteriorly.  The inferior aspect of the incision was then dissected carefully, the anterior aspect of the sternocleidomastoid muscle was dissected and the parotid gland was reflected anteriorly.  The main trunk of the facial nerve was then dissected, the superior and inferior divisions of the facial nerve were then followed from proximal to distal identified preserving each of the nerve branches.  The NIMS monitor was used throughout this component of the surgical procedure.  The parotid mass was identified and dissected free from the surrounding normal-appearing parotid tissue using harmonic scalpel and bipolar cautery.  The parotid specimen was then completely resected and sent to pathology for gross microscopic evaluation.  The facial nerve was then stimulated using the NIMS probe and all branches stimulated appropriately at 0.5 mV.  The parotid bed was then irrigated, hemostasis was maintained with bipolar cautery.  A 7 French round drain was then sutured to the skin with 4-0 Ethilon suture and placed in the parotid bed through the parotid skin incision.  The patient's incision was then closed in multiple layers beginning with reapproximation of the periparotid fascia using interrupted 4-0 Vicryl.  The immediate subcutaneous tissue was closed with interrupted 5-0 Vicryl suture.  The final skin closure was obtained with Dermabond surgical  glue.  An  orogastric tube was passed and stomach contents were aspirated. Patient was awakened from anesthetic and transferred from the operating room to the recovery room in stable condition. There were no complications and blood loss was minimal.   Delsa Bern, M.D. [facility]

## 2020-05-14 NOTE — Anesthesia Procedure Notes (Signed)
Procedure Name: Intubation Date/Time: 05/14/2020 10:45 AM Performed by: Bryson Corona, CRNA Pre-anesthesia Checklist: Patient identified, Emergency Drugs available, Suction available and Patient being monitored Patient Re-evaluated:Patient Re-evaluated prior to induction Oxygen Delivery Method: Circle System Utilized Preoxygenation: Pre-oxygenation with 100% oxygen Induction Type: IV induction Ventilation: Mask ventilation without difficulty Laryngoscope Size: Mac and 3 Grade View: Grade II Tube type: Oral Tube size: 7.0 mm Number of attempts: 1 Airway Equipment and Method: Stylet and Oral airway Placement Confirmation: ETT inserted through vocal cords under direct vision,  positive ETCO2 and breath sounds checked- equal and bilateral Secured at: 20 cm Tube secured with: Tape Dental Injury: Teeth and Oropharynx as per pre-operative assessment

## 2020-05-14 NOTE — Transfer of Care (Signed)
Immediate Anesthesia Transfer of Care Note  Patient: Destiny Graham  Procedure(s) Performed: RIGHT SUPERFICIAL PAROTIDECTOMY (Right Neck)  Patient Location: PACU  Anesthesia Type:General  Level of Consciousness: drowsy  Airway & Oxygen Therapy: Patient Spontanous Breathing and Patient connected to face mask oxygen  Post-op Assessment: Report given to RN and Post -op Vital signs reviewed and stable  Post vital signs: Reviewed and stable  Last Vitals:  Vitals Value Taken Time  BP 130/92 05/14/20 1229  Temp    Pulse 93 05/14/20 1233  Resp 15 05/14/20 1233  SpO2 100 % 05/14/20 1233  Vitals shown include unvalidated device data.  Last Pain:  Vitals:   05/14/20 0902  TempSrc:   PainSc: 0-No pain      Patients Stated Pain Goal: 3 (38/17/71 1657)  Complications: No complications documented.

## 2020-05-14 NOTE — Progress Notes (Signed)
   ENT Progress Note: s/p Procedure(s): RIGHT SUPERFICIAL PAROTIDECTOMY   Subjective: Minimal discomfort, no swelling  Objective: Vital signs in last 24 hours: Temp:  [97.8 F (36.6 C)-98.5 F (36.9 C)] 98.2 F (36.8 C) (09/03 1609) Pulse Rate:  [61-81] 76 (09/03 1609) Resp:  [11-18] 18 (09/03 1609) BP: (119-141)/(66-92) 119/67 (09/03 1609) SpO2:  [95 %-100 %] 97 % (09/03 1609) Weight:  [72.5 kg] 72.5 kg (09/03 0841) Weight change:     Intake/Output from previous day: No intake/output data recorded. Intake/Output this shift: Total I/O In: 1550 [I.V.:1300; IV Piggyback:250] Out: 50 [Blood:50]  Labs: Recent Labs    05/12/20 0844  WBC 4.4  HGB 12.7  HCT 39.2  PLT 303   Recent Labs    05/12/20 0844  NA 137  K 4.1  CL 100  CO2 29  GLUCOSE 109*  BUN 6*  CALCIUM 9.2    Studies/Results: No results found.   PHYSICAL EXAM: Normal facial nerve function, no erythema or swelling. Incision intact, flap stable. Suction drain in place and functioning.   Assessment/Plan: Patient stable after right superficial parotidectomy.  Facial nerve intact.  Monitor overnight, plan drain removal in a.m.    Jerrell Belfast 05/14/2020, 6:46 PM

## 2020-05-14 NOTE — Anesthesia Postprocedure Evaluation (Signed)
Anesthesia Post Note  Patient: Destiny Graham  Procedure(s) Performed: RIGHT SUPERFICIAL PAROTIDECTOMY (Right Neck)     Patient location during evaluation: PACU Anesthesia Type: General Level of consciousness: awake and alert Pain management: pain level controlled Vital Signs Assessment: post-procedure vital signs reviewed and stable Respiratory status: spontaneous breathing, nonlabored ventilation, respiratory function stable and patient connected to nasal cannula oxygen Cardiovascular status: blood pressure returned to baseline and stable Postop Assessment: no apparent nausea or vomiting Anesthetic complications: no   No complications documented.  Last Vitals:  Vitals:   05/14/20 1345 05/14/20 1356  BP: 121/76 134/72  Pulse: 76 72  Resp: 12 18  Temp: 36.7 C   SpO2: 98% 95%    Last Pain:  Vitals:   05/14/20 1330  TempSrc:   PainSc: 3                  Tiajuana Amass

## 2020-05-15 ENCOUNTER — Encounter (HOSPITAL_COMMUNITY): Payer: Self-pay | Admitting: Otolaryngology

## 2020-05-15 DIAGNOSIS — D110 Benign neoplasm of parotid gland: Secondary | ICD-10-CM | POA: Diagnosis not present

## 2020-05-15 NOTE — Discharge Summary (Signed)
Physician Discharge Summary  Patient ID: Destiny Graham MRN: [id number] DOB/AGE: Sep 16, 1955 64 y.o.  Admit date: 05/14/2020 Discharge date: 05/15/2020  Admission Diagnoses:  Active Problems:   Parotid mass   Discharge Diagnoses:  Same  Surgeries: Procedure(s): RIGHT SUPERFICIAL PAROTIDECTOMY on 05/14/2020   Consultants: None  Discharged Condition: Improved  Hospital Course: Destiny Graham is an 64 y.o. female who was admitted 05/14/2020 with a diagnosis of Active Problems:   Parotid mass  and went to the operating room on 05/14/2020 and underwent the above named procedures.   Patient stable on postoperative day 1.  JP drain removed.  Normal facial nerve function.  Patient discharged home in stable condition.  Recent vital signs:  Vitals:   05/15/20 0600 05/15/20 0741  BP: 136/78 127/66  Pulse: 80 80  Resp: 18 18  Temp: 98.6 F (37 C) 98.5 F (36.9 C)  SpO2: 97% 97%    Recent laboratory studies:  Results for orders placed or performed during the hospital encounter of 05/12/20  SARS CORONAVIRUS 2 (TAT 6-24 HRS) Nasopharyngeal Nasopharyngeal Swab   Specimen: Nasopharyngeal Swab  Result Value Ref Range   SARS Coronavirus 2 NEGATIVE NEGATIVE    Discharge Medications:   Allergies as of 05/15/2020      Reactions   Shingrix [zoster Vac Recomb Adjuvanted] Other (See Comments)   Localized reaction with fever, hypotension, vertigo   Vioxx [rofecoxib] Other (See Comments)   Photodermatitis   Sulfa Antibiotics Rash      Medication List    STOP taking these medications   aspirin EC 81 MG tablet     TAKE these medications   acetaminophen 500 MG tablet Commonly known as: TYLENOL Take 1,000 mg by mouth every 6 (six) hours as needed for mild pain or headache.   atenolol 25 MG tablet Commonly known as: TENORMIN Take 1 tablet (25 mg total) by mouth daily.   atorvastatin 80 MG tablet Commonly known as: LIPITOR Take 1 tablet (80 mg total) by mouth daily.   DULoxetine 30 MG  capsule Commonly known as: Cymbalta Take 1 capsule (30 mg total) by mouth daily. Take in addition to 60mg  to equal 90mg  QD.   DULoxetine 60 MG capsule Commonly known as: CYMBALTA Take 1 capsule (60 mg total) by mouth daily. Take in addition to 30mg  to equal 90mg  QD.   gabapentin 300 MG capsule Commonly known as: NEURONTIN Take 1 capsule (300 mg total) by mouth 2 (two) times daily. What changed:   how much to take  when to take this   HYDROcodone-acetaminophen 5-325 MG tablet Commonly known as: Norco Take 1-2 tablets by mouth every 6 (six) hours as needed for up to 5 days for moderate pain.   levothyroxine 125 MCG tablet Commonly known as: SYNTHROID Take 1 tablet (125 mcg total) by mouth daily.   omeprazole 20 MG capsule Commonly known as: PRILOSEC Take 1 capsule (20 mg total) by mouth daily.   tretinoin 0.05 % cream Commonly known as: RETIN-A Apply topically at bedtime.       Diagnostic Studies: CT Soft Tissue Neck W Contrast  Result Date: 04/16/2020 CLINICAL DATA:  Mass, abnormal ultrasound EXAM: CT NECK WITH CONTRAST TECHNIQUE: Multidetector CT imaging of the neck was performed using the standard protocol following the bolus administration of intravenous contrast. CONTRAST:  77mL ISOVUE-300 IOPAMIDOL (ISOVUE-300) INJECTION 61% COMPARISON:  None. FINDINGS: Pharynx and larynx: Unremarkable.  No mass or swelling. Salivary glands: Likely corresponding to ultrasound finding, there is a mass of the superficial right  parotid measuring 1.2 x 1.5 x 1.4 cm. Left parotid and both submandibular glands are unremarkable. Thyroid: Thyroidectomy. Lymph nodes: No enlarged or abnormal density lymph nodes. Vascular: Major neck vessels are patent. There is mild plaque at the ICA origins. Limited intracranial: No abnormal enhancement. Visualized orbits: Not included. Mastoids and visualized paranasal sinuses: Aerated. Skeleton: Multilevel degenerative changes of the cervical spine. Upper chest:  There is a 4 mm ground-glass nodule of the left upper lobe (series 3, image 91). Other: None. IMPRESSION: Right parotid mass probably representing a primary parotid neoplasm. Tissue sampling is recommended to exclude malignancy. Electronically Signed   By: Macy Mis M.D.   On: 04/16/2020 15:25    Disposition: Discharge disposition: 01-Home or Self Care       Discharge Instructions    Diet - low sodium heart healthy   Complete by: As directed    Diet - low sodium heart healthy   Complete by: As directed    Discharge instructions   Complete by: As directed    1. Limited activity 2. Liquid and soft diet, advance as tolerated 3. May bathe and shower day after surgery 4. Wound care - Gentle cleaning with soap and water 5. DO NOT APPLY ANY OINTMENT 6. Elevate Head of Bed  Please contact [facility] [phone]) for any additional concerns.   Discharge instructions   Complete by: As directed    1. Limited activity 2. Liquid and soft diet, advance as tolerated 3. May bathe and shower day after surgery 4. Wound care - Gentle cleaning with soap and water 5. DO NOT APPLY ANY OINTMENT 6. Elevate Head of Bed  Please contact [facility]NT [phone]) for any additional concerns.   Increase activity slowly   Complete by: As directed    Increase activity slowly   Complete by: As directed    No wound care   Complete by: As directed        Follow-up Information    Jerrell Belfast, MD. Schedule an appointment as soon as possible for a visit in 10 days.   Specialty: Otolaryngology Contact information: [address] [place] [address] [phone]                Signed: Jerrell Belfast 05/15/2020, 9:11 AM

## 2020-05-15 NOTE — Progress Notes (Signed)
Pt was wheeled down to transport for discharge to home; she was in no acute complaints of pain nor discomfort; all belongings carried along by daughter; discharge instructions given and she verbalized understanding.

## 2020-05-15 NOTE — Progress Notes (Signed)
   ENT Progress Note: POD #1 s/p Procedure(s): RIGHT SUPERFICIAL PAROTIDECTOMY   Subjective: Minimal discomfort, patient tolerating normal oral diet.  Objective: Vital signs in last 24 hours: Temp:  [97.8 F (36.6 C)-98.6 F (37 C)] 98.5 F (36.9 C) (09/04 0741) Pulse Rate:  [72-82] 80 (09/04 0741) Resp:  [11-18] 18 (09/04 0741) BP: (119-136)/(66-92) 127/66 (09/04 0741) SpO2:  [95 %-100 %] 97 % (09/04 0741) Weight change:     Intake/Output from previous day: 09/03 0701 - 09/04 0700 In: 1550 [I.V.:1300; IV Piggyback:250] Out: 70 [Drains:20; Blood:50] Intake/Output this shift: No intake/output data recorded.  Labs: No results for input(s): WBC, HGB, HCT, PLT in the last 72 hours. No results for input(s): NA, K, CL, CO2, GLUCOSE, BUN, CALCIUM in the last 72 hours.  Invalid input(s): CREATININR  Studies/Results: No results found.   PHYSICAL EXAM: Incision intact, no erythema or swelling. Facial nerve normal. JP drain removed without difficulty.   Assessment/Plan: Patient stable, postoperative day 1.  Discharge to home.  Plan follow-up 10 days for recheck.    Jerrell Belfast 05/15/2020, 9:08 AM

## 2020-05-18 LAB — SURGICAL PATHOLOGY

## 2020-06-18 ENCOUNTER — Ambulatory Visit (INDEPENDENT_AMBULATORY_CARE_PROVIDER_SITE_OTHER): Payer: No Typology Code available for payment source | Admitting: Pulmonary Disease

## 2020-06-18 ENCOUNTER — Encounter: Payer: Self-pay | Admitting: Pulmonary Disease

## 2020-06-18 ENCOUNTER — Other Ambulatory Visit: Payer: Self-pay

## 2020-06-18 VITALS — BP 118/78 | HR 71 | Temp 97.8°F | Ht 66.75 in | Wt 160.0 lb

## 2020-06-18 DIAGNOSIS — G4709 Other insomnia: Secondary | ICD-10-CM | POA: Diagnosis not present

## 2020-06-18 DIAGNOSIS — G4733 Obstructive sleep apnea (adult) (pediatric): Secondary | ICD-10-CM | POA: Diagnosis not present

## 2020-06-18 NOTE — Progress Notes (Signed)
[facility]  Chief Complaint  Patient presents with  . Sleep Consult    Referred by Dr. Vic Blackbird.  Hx of OSA- Stopped using CPAP x 2 years ago. She states that she has trouble with waking up throughout the night and has trouble falling asleep.     Constitutional:  BP 118/78 (BP Location: Left Arm, Cuff Size: Normal)   Pulse 71   Temp 97.8 F (36.6 C) (Oral)   Ht 5' 6.75" (1.695 m)   Wt 160 lb (72.6 kg)   SpO2 96% Comment: on RA  BMI 25.25 kg/m   Past Medical History:  Benign parathyroid adenoma, Breast nodule, GERD, Osteoarthritis, HLD, HTN, Hypothyroidism, Depression  Past Surgical History:  Her  has a past surgical history that includes Tonsillectomy (1960); Myomectomy (1992); Thyroidectomy (2005); ORIF finger fracture (2006); fusion  (2015); Cataract extraction (2016); Bunionectomy; Hardware Removal (Left, 06/15/2017); toenail excision (Right, 06/15/2017); Eye surgery; and Parotidectomy (Right, 05/14/2020).  Brief Summary:  Destiny Graham is a 64 y.o. female with obstructive sleep apnea.  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      Subjective:   She used to travel with her sister to medical conferences.  Several years ago her sister expressed concern that she had sleep apnea.  She had a sleep study which confirmed sleep apnea.  She believes her AHI was 44.  She used CPAP and tolerated this.  She then read a study 2 years ago that said using CPAP might not improve cardiac outcomes.  As a result she stopped using CPAP.  She continues to struggle with her sleep.  She has trouble falling asleep and staying asleep.  She then feels fatigued during the day.  Her sister is now in hospice after being diagnosed with metastatic breast cancer.  She had to move her mother in with her that has dementia.  Her daughter married someone that she met on line, but turns out he is a heroin addict.  She also feels overwhelmed with work  schedule, especially with recent [place].  She isn't sure if she still snores.  She will wake up with a gasp and her mouth gets dry.  She has lots of stuff on her mind when trying to sleep and has trouble unwinding.    She goes to bed between 9 and 10 pm.  She can take up to 2 hours to fall asleep.  She has been using gabapentin to see if this improves her sleep and possible leg symptoms (she reports having elevated PLMI on previous sleep study).  She gets out of bed between 7 and 9 am, depending on her work schedule.  She denies sleep walking, sleep talking, bruxism, or nightmares.  She denies sleep hallucinations, sleep paralysis, or cataplexy.  The Epworth score is 0 out of 24.    Physical Exam:   Appearance - well kempt   ENMT - no sinus tenderness, no oral exudate, no LAN, Mallampati 2 airway, no stridor, low laying soft palate, decreased AP diameter  Respiratory - equal breath sounds bilaterally, no wheezing or rales  CV - s1s2 regular rate and rhythm, no murmurs  Ext - no clubbing, no edema  Skin - no rashes  Psych - normal mood and affect   Sleep Tests:    Social History:  She  reports that she has never smoked. She has never used smokeless tobacco. She reports current alcohol use. She reports that she does  not use drugs.  Family History:  Her family history includes Alcohol abuse in her father, paternal grandmother, and sister; Arthritis in her mother; COPD in her father and maternal grandfather; Depression in her mother, sister, sister, and sister; Hearing loss in her father; Heart disease in her father, maternal grandmother, sister, and sister; Hyperlipidemia in her father, sister, and sister; Hypertension in her father; Kidney disease in her father.    Discussion:  She has snoring, sleep disruption, apnea, and daytime sleepiness.  She has history of hypertension and depression.  She has symptoms of insomnia.  She reports having previous sleep study showing  severe obstructive sleep apnea.  I am concerned she could still have sleep apnea.  Assessment/Plan:   Snoring with excessive daytime sleepiness. - will need to arrange for a home sleep study  Insomnia. - discussed sleep restriction, stimulus control and relaxation techniques - proper sleep hygiene techniques reviewed - defer sleep aide medication for now  Cardiovascular risk. - had an extensive discussion regarding the adverse health consequences related to untreated sleep disordered breathing - specifically discussed the risks for hypertension, coronary artery disease, cardiac dysrhythmias, cerebrovascular disease, and diabetes - lifestyle modification discussed  Safe driving practices. - discussed how sleep disruption can increase risk of accidents, particularly when driving - safe driving practices were discussed  Therapies for obstructive sleep apnea. - if the sleep study shows significant sleep apnea, then various therapies for treatment were reviewed: CPAP, oral appliance, and surgical interventions  Time Spent Involved in Patient Care on Day of Examination:  47 minutes  Follow up:  Patient Instructions  Will arrange for sleep study Will call to arrange for follow up after sleep study reviewed   Medication List:   Allergies as of 06/18/2020      Reactions   Shingrix [zoster Vac Recomb Adjuvanted] Other (See Comments)   Localized reaction with fever, hypotension, vertigo   Vioxx [rofecoxib] Other (See Comments)   Photodermatitis   Sulfa Antibiotics Rash      Medication List       Accurate as of June 18, 2020  9:52 AM. If you have any questions, ask your nurse or doctor.        acetaminophen 500 MG tablet Commonly known as: TYLENOL Take 1,000 mg by mouth every 6 (six) hours as needed for mild pain or headache.   atenolol 25 MG tablet Commonly known as: TENORMIN Take 1 tablet (25 mg total) by mouth daily.   atorvastatin 80 MG tablet Commonly known as:  LIPITOR Take 1 tablet (80 mg total) by mouth daily.   DULoxetine 30 MG capsule Commonly known as: Cymbalta Take 1 capsule (30 mg total) by mouth daily. Take in addition to $RemoveBef'60mg'YLAiopXopT$  to equal $Remove'90mg'qAuKekP$  QD.   DULoxetine 60 MG capsule Commonly known as: CYMBALTA Take 1 capsule (60 mg total) by mouth daily. Take in addition to $RemoveBef'30mg'JgCSWuwkdP$  to equal $Remove'90mg'xhAGxJV$  QD.   gabapentin 300 MG capsule Commonly known as: NEURONTIN Take 1 capsule (300 mg total) by mouth 2 (two) times daily. What changed:   how much to take  when to take this   levothyroxine 125 MCG tablet Commonly known as: SYNTHROID Take 1 tablet (125 mcg total) by mouth daily.   omeprazole 20 MG capsule Commonly known as: PRILOSEC Take 1 capsule (20 mg total) by mouth daily.   tretinoin 0.05 % cream Commonly known as: RETIN-A Apply topically at bedtime.       Signature:  Chesley Mires, MD [place] Pager - [phone]  [phone] 06/18/2020, 9:52 AM

## 2020-06-18 NOTE — Patient Instructions (Signed)
Will arrange for sleep study Will call to arrange for follow up after sleep study reviewed 

## 2020-06-29 ENCOUNTER — Telehealth: Payer: Self-pay | Admitting: Pulmonary Disease

## 2020-06-29 NOTE — Telephone Encounter (Signed)
Spoke to [place] & faxed last OV note for precert.  Rec'd fax confirm.

## 2020-07-05 ENCOUNTER — Encounter: Payer: Self-pay | Admitting: Family Medicine

## 2020-07-05 MED ORDER — LORAZEPAM 0.5 MG PO TABS: 0.5000 mg | ORAL_TABLET | Freq: Three times a day (TID) | ORAL | 0 refills | Status: DC | PRN

## 2020-07-22 ENCOUNTER — Other Ambulatory Visit: Payer: Self-pay | Admitting: Family Medicine

## 2020-07-22 NOTE — Telephone Encounter (Signed)
Please advise 

## 2020-07-23 MED ORDER — LORAZEPAM 0.5 MG PO TABS: 0.5000 mg | ORAL_TABLET | Freq: Three times a day (TID) | ORAL | 0 refills | Status: DC | PRN

## 2020-07-27 ENCOUNTER — Telehealth: Payer: Self-pay | Admitting: Pulmonary Disease

## 2020-07-27 NOTE — Telephone Encounter (Signed)
Appt scheduled

## 2020-07-28 ENCOUNTER — Encounter: Payer: Self-pay | Admitting: Family Medicine

## 2020-07-28 ENCOUNTER — Ambulatory Visit: Payer: No Typology Code available for payment source

## 2020-07-28 ENCOUNTER — Other Ambulatory Visit: Payer: Self-pay

## 2020-07-28 ENCOUNTER — Ambulatory Visit (INDEPENDENT_AMBULATORY_CARE_PROVIDER_SITE_OTHER): Payer: No Typology Code available for payment source | Admitting: Family Medicine

## 2020-07-28 VITALS — BP 128/70 | HR 62 | Temp 98.1°F | Resp 14 | Ht 66.75 in | Wt 160.0 lb

## 2020-07-28 DIAGNOSIS — F331 Major depressive disorder, recurrent, moderate: Secondary | ICD-10-CM

## 2020-07-28 DIAGNOSIS — I10 Essential (primary) hypertension: Secondary | ICD-10-CM | POA: Diagnosis not present

## 2020-07-28 DIAGNOSIS — Z9989 Dependence on other enabling machines and devices: Secondary | ICD-10-CM

## 2020-07-28 DIAGNOSIS — E890 Postprocedural hypothyroidism: Secondary | ICD-10-CM

## 2020-07-28 DIAGNOSIS — E7800 Pure hypercholesterolemia, unspecified: Secondary | ICD-10-CM

## 2020-07-28 DIAGNOSIS — G4733 Obstructive sleep apnea (adult) (pediatric): Secondary | ICD-10-CM

## 2020-07-28 DIAGNOSIS — Z0001 Encounter for general adult medical examination with abnormal findings: Secondary | ICD-10-CM | POA: Diagnosis not present

## 2020-07-28 DIAGNOSIS — Z0000 Encounter for general adult medical examination without abnormal findings: Secondary | ICD-10-CM

## 2020-07-28 DIAGNOSIS — R911 Solitary pulmonary nodule: Secondary | ICD-10-CM | POA: Insufficient documentation

## 2020-07-28 MED ORDER — DULOXETINE HCL 60 MG PO CPEP
ORAL_CAPSULE | ORAL | 3 refills | Status: DC
Start: 2020-07-28 — End: 2020-10-26

## 2020-07-28 NOTE — Assessment & Plan Note (Signed)
Controlled no changes 

## 2020-07-28 NOTE — Progress Notes (Signed)
Subjective:    Patient ID: Destiny Graham, female    DOB: August 05, 1956, 63 y.o.   MRN: [id number]  Patient presents for Annual Exam (is fasting)   Pt here for CPE  meds and history reviewed   Grief in setting of MDD- her sister passed away recently she has been using ativan 1mg  to help with sleep and her nerves. She is currently on Cymbalta 90 mg but still feels a very few depressed.  She has been on multiple antidepressants in the past.  She does feel like every few years very depressed and needs to be changed.  She has also tried psychotherapy but does not get any benefit from this.  At times she feels like her anxiety is also creeping up She is still working, feels she can make it through the day for the most part   OSA continues on CPAP Therapy - followed by pulmonary   HTN- taking BP meds as rpescribed    Hypothyroidism -taking levothyroxine  167mcg once a day    Hyperlipidemia - taking lipitor 80mg , this was increased  In July, last LDL 111, family history of heart disease    Mammogram UTD   Colonoscopy UTD   Immunizations- PNA UTD, FLU, COVID, TDAP UTD   Bone Density Normal   Noted 61mm nodule in left upper lobe, non smoker  , se had Histoplasmosis in the past , her lung doctor is aware   She has not been exercising regulary, 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    Review Of Systems:  GEN- denies fatigue, fever, weight loss,weakness, recent illness HEENT- denies eye drainage, change in vision, nasal discharge, CVS- denies chest pain, palpitations RESP- denies SOB, cough, wheeze ABD- denies N/V, change in stools, abd pain GU- denies dysuria, hematuria, dribbling, incontinence MSK- denies joint pain, muscle aches, injury Neuro- denies headache, dizziness, syncope, seizure activity       Objective:    BP 128/70   Pulse 62   Temp 98.1 F (36.7 C) (Temporal)   Resp 14   Ht 5' 6.75" (1.695 m)   Wt 160 lb (72.6 kg)   SpO2 97%   BMI 25.25 kg/m  GEN- NAD,  alert and oriented x3 HEENT- PERRL, EOMI, non injected sclera, pink conjunctiva, MMM, oropharynx clear , right post auricular incision -well healed  Neck- Supple, no thyromegaly CVS- RRR, no murmur RESP-CTAB ABD-NABS,soft,NT,ND Psych - depressed affect, not anxious, good eye contact, normal speech, tearful, no apparent SI ,PHQ 9 score 12 EXT- No edema Pulses- Radial, DP- 2+    Audit C/FALL screen negative     Assessment & Plan:      Problem List Items Addressed This Visit      Unprioritized   Essential hypertension    Controlled no changes       Relevant Orders   CBC with Differential/Platelet   Comprehensive metabolic panel   Hyperlipidemia    reassess lipids, LFT on higher dose of statin       Relevant Orders   CBC with Differential/Platelet   Comprehensive metabolic panel   Lipid panel   Hypothyroidism   Relevant Orders   TSH   T3, free   T4, free   Lung nodule seen on imaging study   MDD (major depressive disorder), recurrent episode, moderate (HCC)    After further discussion of her worsening depression in the setting of the grief regarding her sister's death.  We will increase Cymbalta to 120 mg to maximize  the medication.  We will follow-up via MyChart in 2 to 3 weeks.  If she is still having significant anxiety requiring more use of lorazepam more than 1 mg at bedtime will recommend adding BuSpar.  She does not feel well with antipsychotics and we did discuss adjunct with Abilify.  She also had side effects with Wellbutrin.      OSA on CPAP    Following with pulmonary, sleep study tonight        Other Visit Diagnoses    Routine general medical examination at a health care facility    -  Primary   CPE done, prevention UTD, Prevnar 13 can be given next year       Note: This dictation was prepared with Dragon dictation along with smaller phrase technology. Any transcriptional errors that result from this process are unintentional.

## 2020-07-28 NOTE — Assessment & Plan Note (Signed)
reassess lipids, LFT on higher dose of statin

## 2020-07-28 NOTE — Patient Instructions (Addendum)
Increase Cymbalta to  120mg  once a day  F/U 2-3 weeks via mychart

## 2020-07-28 NOTE — Assessment & Plan Note (Signed)
Following with pulmonary, sleep study tonight

## 2020-07-28 NOTE — Assessment & Plan Note (Signed)
After further discussion of her worsening depression in the setting of the grief regarding her sister's death.  We will increase Cymbalta to 120 mg to maximize the medication.  We will follow-up via MyChart in 2 to 3 weeks.  If she is still having significant anxiety requiring more use of lorazepam more than 1 mg at bedtime will recommend adding BuSpar.  She does not feel well with antipsychotics and we did discuss adjunct with Abilify.  She also had side effects with Wellbutrin.

## 2020-07-30 LAB — CBC WITH DIFFERENTIAL/PLATELET
Absolute Monocytes: 418 cells/uL (ref 200–950)
Basophils Absolute: 61 cells/uL (ref 0–200)
Basophils Relative: 1.3 %
Eosinophils Absolute: 188 cells/uL (ref 15–500)
Eosinophils Relative: 4 %
HCT: 41.5 % (ref 35.0–45.0)
Hemoglobin: 13.4 g/dL (ref 11.7–15.5)
Lymphs Abs: 1701 cells/uL (ref 850–3900)
MCH: 30.6 pg (ref 27.0–33.0)
MCHC: 32.3 g/dL (ref 32.0–36.0)
MCV: 94.7 fL (ref 80.0–100.0)
MPV: 10.4 fL (ref 7.5–12.5)
Monocytes Relative: 8.9 %
Neutro Abs: 2331 cells/uL (ref 1500–7800)
Neutrophils Relative %: 49.6 %
Platelets: 309 10*3/uL (ref 140–400)
RBC: 4.38 10*6/uL (ref 3.80–5.10)
RDW: 13.3 % (ref 11.0–15.0)
Total Lymphocyte: 36.2 %
WBC: 4.7 10*3/uL (ref 3.8–10.8)

## 2020-07-30 LAB — COMPREHENSIVE METABOLIC PANEL
AG Ratio: 2.1 (calc) (ref 1.0–2.5)
ALT: 14 U/L (ref 6–29)
AST: 20 U/L (ref 10–35)
Albumin: 4.1 g/dL (ref 3.6–5.1)
Alkaline phosphatase (APISO): 74 U/L (ref 37–153)
BUN: 21 mg/dL (ref 7–25)
CO2: 29 mmol/L (ref 20–32)
Calcium: 9.5 mg/dL (ref 8.6–10.4)
Chloride: 103 mmol/L (ref 98–110)
Creat: 0.96 mg/dL (ref 0.50–0.99)
Globulin: 2 g/dL (calc) (ref 1.9–3.7)
Glucose, Bld: 99 mg/dL (ref 65–99)
Potassium: 5.1 mmol/L (ref 3.5–5.3)
Sodium: 139 mmol/L (ref 135–146)
Total Bilirubin: 0.5 mg/dL (ref 0.2–1.2)
Total Protein: 6.1 g/dL (ref 6.1–8.1)

## 2020-07-30 LAB — LIPID PANEL
Cholesterol: 214 mg/dL — ABNORMAL HIGH (ref <200)
HDL: 86 mg/dL (ref >=50)
LDL Cholesterol (Calc): 111 mg/dL (calc) — ABNORMAL HIGH
Non-HDL Cholesterol (Calc): 128 mg/dL (calc) (ref <130)
Total CHOL/HDL Ratio: 2.5 (calc) (ref <5.0)
Triglycerides: 80 mg/dL (ref <150)

## 2020-07-30 LAB — T3, FREE: T3, Free: 2.6 pg/mL (ref 2.3–4.2)

## 2020-07-30 LAB — T4, FREE: Free T4: 1.4 ng/dL (ref 0.8–1.8)

## 2020-07-30 LAB — TSH: TSH: 2.18 mIU/L (ref 0.40–4.50)

## 2020-08-10 ENCOUNTER — Other Ambulatory Visit: Payer: Self-pay | Admitting: Family Medicine

## 2020-08-10 ENCOUNTER — Encounter: Payer: Self-pay | Admitting: Family Medicine

## 2020-08-10 MED ORDER — ROSUVASTATIN CALCIUM 20 MG PO TABS: 20.0000 mg | ORAL_TABLET | Freq: Every day | ORAL | 3 refills | Status: DC

## 2020-08-10 MED ORDER — LORAZEPAM 0.5 MG PO TABS: 0.5000 mg | ORAL_TABLET | Freq: Three times a day (TID) | ORAL | 0 refills | Status: DC | PRN

## 2020-08-10 NOTE — Telephone Encounter (Signed)
Ok to refill??  Last office visit 07/28/2020.  Last refill 07/23/2020.

## 2020-08-11 MED ORDER — LORAZEPAM 1 MG PO TABS: 1.0000 mg | ORAL_TABLET | Freq: Three times a day (TID) | ORAL | 0 refills | Status: DC | PRN

## 2020-08-11 NOTE — Addendum Note (Signed)
Addended by: Vic Blackbird F on: 08/11/2020 10:49 PM   Modules accepted: Orders

## 2020-09-21 ENCOUNTER — Other Ambulatory Visit: Payer: Self-pay | Admitting: Family Medicine

## 2020-09-21 ENCOUNTER — Encounter: Payer: Self-pay | Admitting: Family Medicine

## 2020-09-21 MED ORDER — LORAZEPAM 1 MG PO TABS: 1.0000 mg | ORAL_TABLET | Freq: Three times a day (TID) | ORAL | 1 refills | Status: DC | PRN

## 2020-09-21 NOTE — Telephone Encounter (Signed)
Ok to refill??  Last office visit 07/28/2020.  Last refill 08/11/2020.

## 2020-10-01 ENCOUNTER — Encounter: Payer: Self-pay | Admitting: Pulmonary Disease

## 2020-10-26 ENCOUNTER — Encounter: Payer: Self-pay | Admitting: Family Medicine

## 2020-10-26 ENCOUNTER — Other Ambulatory Visit: Payer: Self-pay | Admitting: Family Medicine

## 2020-10-26 DIAGNOSIS — F331 Major depressive disorder, recurrent, moderate: Secondary | ICD-10-CM

## 2020-10-26 MED ORDER — LORAZEPAM 1 MG PO TABS: 1.0000 mg | ORAL_TABLET | Freq: Three times a day (TID) | ORAL | 1 refills | Status: DC | PRN

## 2020-10-26 MED ORDER — DULOXETINE HCL 60 MG PO CPEP
ORAL_CAPSULE | ORAL | 1 refills | Status: DC
Start: 2020-10-26 — End: 2020-10-26

## 2020-11-05 ENCOUNTER — Ambulatory Visit: Payer: No Typology Code available for payment source | Admitting: Medical-Surgical

## 2020-11-08 ENCOUNTER — Encounter: Payer: Self-pay | Admitting: Medical-Surgical

## 2020-11-08 ENCOUNTER — Ambulatory Visit (INDEPENDENT_AMBULATORY_CARE_PROVIDER_SITE_OTHER): Payer: No Typology Code available for payment source | Admitting: Medical-Surgical

## 2020-11-08 ENCOUNTER — Other Ambulatory Visit: Payer: Self-pay

## 2020-11-08 VITALS — BP 131/79 | HR 72 | Temp 98.7°F | Ht 66.75 in | Wt 171.1 lb

## 2020-11-08 DIAGNOSIS — E7800 Pure hypercholesterolemia, unspecified: Secondary | ICD-10-CM

## 2020-11-08 DIAGNOSIS — I10 Essential (primary) hypertension: Secondary | ICD-10-CM

## 2020-11-08 DIAGNOSIS — K219 Gastro-esophageal reflux disease without esophagitis: Secondary | ICD-10-CM | POA: Diagnosis not present

## 2020-11-08 DIAGNOSIS — G4733 Obstructive sleep apnea (adult) (pediatric): Secondary | ICD-10-CM

## 2020-11-08 DIAGNOSIS — F331 Major depressive disorder, recurrent, moderate: Secondary | ICD-10-CM

## 2020-11-08 DIAGNOSIS — Z1159 Encounter for screening for other viral diseases: Secondary | ICD-10-CM

## 2020-11-08 DIAGNOSIS — M159 Polyosteoarthritis, unspecified: Secondary | ICD-10-CM

## 2020-11-08 DIAGNOSIS — M5030 Other cervical disc degeneration, unspecified cervical region: Secondary | ICD-10-CM

## 2020-11-08 DIAGNOSIS — Z9989 Dependence on other enabling machines and devices: Secondary | ICD-10-CM

## 2020-11-08 DIAGNOSIS — E890 Postprocedural hypothyroidism: Secondary | ICD-10-CM

## 2020-11-08 DIAGNOSIS — Z114 Encounter for screening for human immunodeficiency virus [HIV]: Secondary | ICD-10-CM

## 2020-11-08 DIAGNOSIS — Z7689 Persons encountering health services in other specified circumstances: Secondary | ICD-10-CM

## 2020-11-08 NOTE — Progress Notes (Signed)
New Patient Office Visit  Subjective:  Patient ID: Destiny Graham, female    DOB: 06/14/1956  Age: 65 y.o. MRN: [id number]  CC:  Chief Complaint  Patient presents with  . Establish Care    HPI Destiny Graham presents to establish care.   Depression/Anxiety- taking Cymbalta 120mg  daily with prn Lorazepam (~ 30 tabs in the past 2 months). Mood worsened since her sister's passing. Has an appointment with a counselor and a psychiatrist upcoming.   OSA- has a diagnosis of sleep apnea and had a CPAP but hated it. It was not helping her sleep so she stopped using it. She has since seen a provider at [facility] and is planning to get a repeat sleep study soon.   HLD- previously treated with Lipitor but cholesterol did not respond to the max dose so she was switched to Crestor about 3 months ago. Has not had her cholesterol rechecked since then.   HTN- taking atenolol 25mg  daily, tolerating well.   OA/DDD- taking gabapentin as prescribed.  Hypothyroidism- taking levothyroxine 125mg  daily, tolerating well. Last thyroid function completed 07/28/2020 was normal.  GERD-taking omeprazole 20 mg daily.  Past Medical History:  Diagnosis Date  . Anxiety    Phreesia 07/27/2020  . Arthritis    Phreesia 07/27/2020  . Breast nodule   . Cataract    Phreesia 07/27/2020  . DDD (degenerative disc disease), cervical   . Dysthymia   . Generalized OA   . GERD (gastroesophageal reflux disease)   . Hallux rigidus   . Heart murmur    beneign  . Hyperlipidemia   . Hypertension   . OSA on CPAP    Does not use a cpap  . Pseudophakia   . Sleep apnea    Phreesia 07/27/2020  . Thyroid disease   . Uterine fibroid     Past Surgical History:  Procedure Laterality Date  . BUNIONECTOMY    . CATARACT EXTRACTION  2016  . EYE SURGERY    . FRACTURE SURGERY N/A    Phreesia 07/27/2020  . fusion   2015   first MTP B  . HARDWARE REMOVAL Left 06/15/2017   Procedure: Left Great Toe Hardware Removal;   Surgeon: Newt Minion, MD;  Location: [place];  Service: Orthopedics;  Laterality: Left;  . MYOMECTOMY  1992  . ORIF FINGER FRACTURE  2006  . PAROTIDECTOMY Right 05/14/2020   Procedure: RIGHT SUPERFICIAL PAROTIDECTOMY;  Surgeon: Jerrell Belfast, MD;  Location: [place];  Service: ENT;  Laterality: Right;  REQUESTING RNFA  . THYROIDECTOMY  2005  . TOENAIL EXCISION Right 06/15/2017   Procedure: Right Great Toe Excision Lateral Border Nail;  Surgeon: Newt Minion, MD;  Location: [place];  Service: Orthopedics;  Laterality: Right;  . TONSILLECTOMY  1960    Family History  Problem Relation Age of Onset  . Arthritis Mother   . Depression Mother   . COPD Father   . Hearing loss Father   . Heart disease Father   . Hyperlipidemia Father   . Hypertension Father   . Kidney disease Father   . Alcohol abuse Father   . Depression Sister   . Alcohol abuse Sister   . Heart disease Maternal Grandmother        CHF  . COPD Maternal Grandfather   . Alcohol abuse Paternal Grandmother   . Depression Sister   . Heart disease Sister   . Hyperlipidemia Sister   . Depression Sister   . Heart disease Sister   .  Hyperlipidemia Sister     Social History   Socioeconomic History  . Marital status: Divorced    Spouse name: Not on file  . Number of children: Not on file  . Years of education: Not on file  . Highest education level: Not on file  Occupational History  . Not on file  Tobacco Use  . Smoking status: Never Smoker  . Smokeless tobacco: Never Used  Vaping Use  . Vaping Use: Never used  Substance and Sexual Activity  . Alcohol use: Yes    Alcohol/week: 6.0 - 7.0 standard drinks    Types: 6 - 7 Standard drinks or equivalent per week  . Drug use: Never  . Sexual activity: Not Currently  Other Topics Concern  . Not on file  Social History Narrative  . Not on file   Social Determinants of Health   Financial Resource Strain: Not on file  Food Insecurity: Not on file  Transportation  Needs: Not on file  Physical Activity: Not on file  Stress: Not on file  Social Connections: Not on file  Intimate Partner Violence: Not on file    ROS Review of Systems  Constitutional: Negative for chills, fatigue, fever and unexpected weight change.  Respiratory: Negative for cough, chest tightness, shortness of breath and wheezing.   Cardiovascular: Negative for chest pain, palpitations and leg swelling.  Neurological: Negative for dizziness, light-headedness and headaches.  Psychiatric/Behavioral: Positive for dysphoric mood. Negative for self-injury, sleep disturbance and suicidal ideas. The patient is nervous/anxious.     Objective:   Today's Vitals: BP 131/79   Pulse 72   Temp 98.7 F (37.1 C)   Ht 5' 6.75" (1.695 m)   Wt 171 lb 1.9 oz (77.6 kg)   SpO2 98%   BMI 27.00 kg/m   Physical Exam Vitals reviewed.  Constitutional:      General: She is not in acute distress.    Appearance: Normal appearance. She is not ill-appearing.  HENT:     Head: Normocephalic and atraumatic.  Cardiovascular:     Rate and Rhythm: Normal rate and regular rhythm.     Pulses: Normal pulses.     Heart sounds: Normal heart sounds. No murmur heard. No friction rub. No gallop.   Pulmonary:     Effort: Pulmonary effort is normal. No respiratory distress.     Breath sounds: Normal breath sounds. No wheezing.  Skin:    General: Skin is warm and dry.  Neurological:     Mental Status: She is alert and oriented to person, place, and time.  Psychiatric:        Mood and Affect: Mood normal.        Behavior: Behavior normal.        Thought Content: Thought content normal.        Judgment: Judgment normal.    Assessment & Plan:   1. Encounter to establish care Reviewed available information and discussed healthcare concerns with patient.  2. Pure hypercholesterolemia Continue Crestor 20 mg daily.  Checking lipid panel today. - Lipid panel  3. OSA on CPAP Recommend home sleep study but  may need to complete a CPAP titration study instead since she already has a diagnosis.  If there are any issues, we can investigate which will be more helpful.  4. Gastroesophageal reflux disease without esophagitis Continue omeprazole 20 mg daily.  5. Essential hypertension Continue atenolol 25 mg daily.  6. Postoperative hypothyroidism Continue levothyroxine 125 mcg daily.  Plan for recheck of  thyroid function in 07/2021.  7. MDD (major depressive disorder), recurrent episode, moderate (HCC) Continue Cymbalta 125 mg daily for now.  Plan to keep appointment with psychiatry and counseling as scheduled.  8. Generalized OA/cervical DDD Continue gabapentin as prescribed.  Outpatient Encounter Medications as of 11/08/2020  Medication Sig  . acetaminophen (TYLENOL) 500 MG tablet Take 1,000 mg by mouth every 6 (six) hours as needed for mild pain or headache.   Marland Kitchen atenolol (TENORMIN) 25 MG tablet Take 1 tablet (25 mg total) by mouth daily.  . DULoxetine (CYMBALTA) 60 MG capsule Take 120mg  once a day  . gabapentin (NEURONTIN) 300 MG capsule Take 1 capsule (300 mg total) by mouth 2 (two) times daily. (Patient taking differently: Take 600 mg by mouth at bedtime.)  . levothyroxine (SYNTHROID) 125 MCG tablet Take 1 tablet (125 mcg total) by mouth daily.  Marland Kitchen LORazepam (ATIVAN) 1 MG tablet Take 1 tablet (1 mg total) by mouth every 8 (eight) hours as needed for anxiety.  Marland Kitchen omeprazole (PRILOSEC) 20 MG capsule Take 1 capsule (20 mg total) by mouth daily.  . rosuvastatin (CRESTOR) 20 MG tablet Take 1 tablet (20 mg total) by mouth daily.  Marland Kitchen tretinoin (RETIN-A) 0.05 % cream Apply topically at bedtime.   No facility-administered encounter medications on file as of 11/08/2020.    Follow-up: Return in about 6 months (around 05/08/2021) for chronic disease follow up.   Clearnce Sorrel, DNP, APRN, FNP-BC [facility]

## 2020-11-09 ENCOUNTER — Encounter: Payer: Self-pay | Admitting: Medical-Surgical

## 2020-11-09 LAB — LIPID PANEL
Cholesterol: 192 mg/dL (ref <200)
HDL: 80 mg/dL (ref >=50)
LDL Cholesterol (Calc): 81 mg/dL (calc)
Non-HDL Cholesterol (Calc): 112 mg/dL (calc) (ref <130)
Total CHOL/HDL Ratio: 2.4 (calc) (ref <5.0)
Triglycerides: 211 mg/dL — ABNORMAL HIGH (ref <150)

## 2020-11-30 ENCOUNTER — Other Ambulatory Visit (HOSPITAL_BASED_OUTPATIENT_CLINIC_OR_DEPARTMENT_OTHER): Payer: Self-pay

## 2020-12-06 ENCOUNTER — Other Ambulatory Visit (HOSPITAL_COMMUNITY): Payer: Self-pay | Admitting: *Deleted

## 2020-12-06 ENCOUNTER — Ambulatory Visit (INDEPENDENT_AMBULATORY_CARE_PROVIDER_SITE_OTHER): Payer: No Typology Code available for payment source | Admitting: Licensed Clinical Social Worker

## 2020-12-06 DIAGNOSIS — F331 Major depressive disorder, recurrent, moderate: Secondary | ICD-10-CM

## 2020-12-06 DIAGNOSIS — F410 Panic disorder [episodic paroxysmal anxiety] without agoraphobia: Secondary | ICD-10-CM

## 2020-12-06 DIAGNOSIS — F439 Reaction to severe stress, unspecified: Secondary | ICD-10-CM

## 2020-12-06 DIAGNOSIS — F4321 Adjustment disorder with depressed mood: Secondary | ICD-10-CM | POA: Diagnosis not present

## 2020-12-06 DIAGNOSIS — F411 Generalized anxiety disorder: Secondary | ICD-10-CM | POA: Diagnosis not present
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
## 2020-12-10 ENCOUNTER — Ambulatory Visit (HOSPITAL_BASED_OUTPATIENT_CLINIC_OR_DEPARTMENT_OTHER)
Admission: RE | Admit: 2020-12-10 | Discharge: 2020-12-10 | Disposition: A | Discharge status: Home / Self Care | Payer: No Typology Code available for payment source | Source: Ambulatory Visit | Attending: Cardiology | Admitting: Cardiology

## 2020-12-10 ENCOUNTER — Other Ambulatory Visit: Payer: Self-pay

## 2020-12-16 ENCOUNTER — Telehealth (HOSPITAL_COMMUNITY): Payer: No Typology Code available for payment source | Admitting: Psychiatry

## 2020-12-16 ENCOUNTER — Other Ambulatory Visit (HOSPITAL_COMMUNITY): Payer: Self-pay

## 2020-12-29 ENCOUNTER — Other Ambulatory Visit: Payer: Self-pay | Admitting: Family Medicine

## 2020-12-29 ENCOUNTER — Other Ambulatory Visit (HOSPITAL_COMMUNITY): Payer: Self-pay

## 2020-12-29 MED FILL — Atenolol Tab 25 MG: ORAL | 90 days supply | Qty: 90 | Fill #0 | Status: AC

## 2020-12-29 MED FILL — Gabapentin Cap 300 MG: ORAL | 90 days supply | Qty: 180 | Fill #0 | Status: CN

## 2020-12-31 ENCOUNTER — Other Ambulatory Visit (HOSPITAL_COMMUNITY): Payer: Self-pay

## 2020-12-31 ENCOUNTER — Other Ambulatory Visit: Payer: Self-pay | Admitting: Medical-Surgical

## 2020-12-31 MED ORDER — LORAZEPAM 1 MG PO TABS
ORAL_TABLET | ORAL | 1 refills | Status: DC
  Filled 2020-12-31: qty 30, 10d supply, fill #0
  Filled 2021-01-26: qty 30, 10d supply, fill #1

## 2021-01-03 ENCOUNTER — Other Ambulatory Visit (HOSPITAL_COMMUNITY): Payer: Self-pay

## 2021-01-03 MED FILL — Gabapentin Cap 300 MG: ORAL | 90 days supply | Qty: 180 | Fill #0 | Status: CN

## 2021-01-05 ENCOUNTER — Other Ambulatory Visit (HOSPITAL_COMMUNITY): Payer: Self-pay

## 2021-01-12 ENCOUNTER — Ambulatory Visit (HOSPITAL_COMMUNITY): Payer: No Typology Code available for payment source | Admitting: Licensed Clinical Social Worker

## 2021-01-12 NOTE — Progress Notes (Signed)
  Message that patient's mom in ICU so needed to cancel the visit.

## 2021-01-22 ENCOUNTER — Other Ambulatory Visit (HOSPITAL_COMMUNITY): Payer: Self-pay

## 2021-01-22 MED FILL — Gabapentin Cap 300 MG: ORAL | 90 days supply | Qty: 180 | Fill #0 | Status: AC

## 2021-01-24 ENCOUNTER — Other Ambulatory Visit (HOSPITAL_COMMUNITY): Payer: Self-pay

## 2021-01-24 MED FILL — Duloxetine HCl Enteric Coated Pellets Cap 60 MG (Base Eq): ORAL | 90 days supply | Qty: 180 | Fill #0 | Status: AC

## 2021-01-26 ENCOUNTER — Ambulatory Visit (INDEPENDENT_AMBULATORY_CARE_PROVIDER_SITE_OTHER): Payer: No Typology Code available for payment source | Admitting: Licensed Clinical Social Worker

## 2021-01-26 ENCOUNTER — Encounter: Payer: Self-pay | Admitting: Medical-Surgical

## 2021-01-26 ENCOUNTER — Other Ambulatory Visit (HOSPITAL_COMMUNITY): Payer: Self-pay

## 2021-01-26 DIAGNOSIS — F4321 Adjustment disorder with depressed mood: Secondary | ICD-10-CM

## 2021-01-26 DIAGNOSIS — F331 Major depressive disorder, recurrent, moderate: Secondary | ICD-10-CM | POA: Diagnosis not present

## 2021-01-26 DIAGNOSIS — F439 Reaction to severe stress, unspecified: Secondary | ICD-10-CM

## 2021-01-26 DIAGNOSIS — F411 Generalized anxiety disorder: Secondary | ICD-10-CM

## 2021-01-26 DIAGNOSIS — F410 Panic disorder [episodic paroxysmal anxiety] without agoraphobia: Secondary | ICD-10-CM

## 2021-01-26 NOTE — Progress Notes (Signed)
Virtual Visit via Video Note  I connected with Blanchie Serve on 01/26/21 at  2:00 PM EDT by a video enabled telemedicine application and verified that I am speaking with the correct person using two identifiers.  Location: Patient: home Provider: home office   I discussed the limitations of evaluation and management by telemedicine and the availability of in person appointments. The patient expressed understanding and agreed to proceed.   I discussed the assessment and treatment plan with the patient. The patient was provided an opportunity to ask questions and all were answered. The patient agreed with the plan and demonstrated an understanding of the instructions.   The patient was advised to call back or seek an in-person evaluation if the symptoms worsen or if the condition fails to improve as anticipated.  I provided 53 minutes of non-face-to-face time during this encounter.  THERAPIST PROGRESS NOTE  Session Time: 2:00 PM to 2:53 PM  Participation Level: Active  Behavioral Response: CasualAlertDepressed and tearful  Type of Therapy: Individual Therapy  Treatment Goals addressed:  Anxiety, grief, coping Interventions: Solution Focused, Strength-based, Supportive, Reframing and Other: grief  Summary: Mittie Knittel is a 65 y.o. female who presents with mom on hospice and will die in next week. Very difficult for patient right now. Nurse's aid come in a couple days a week. One sister coming tomorrow another sister planning to come June 1st surprised if make it to June 1st. Mom has been slow decline for last 8 years. Collapsed a couple a couple of weeks ago, pulmonary fibrosis as well as fluid collections, one lung collapsed and other 50% her lungs have been scarring for months and got pneumonia on top of that, think from [place] caused her lungs to fail, on oxygen sudden, was not ready for all this to suddenly happened. With her dementia expected a slow decline. Overall Dementia and  respiratory failure so not expected to live very long. Last time talked to therapist dealing with sister dying, physican burn out with COVID, worry about daughter and with mom as caretaker. Now with mom dying pretty anxious and depressed state. Prepared for slow decline had another 5 years or more. COVID think caused lung disease and respiratory failure not expected and all this new as of two weeks ago. Talked about needing her sister right now who has passed. [name] best friend and support and thinking about her and need her support right now to get through it. Making her miss her more, need her and don't have her. 10 months older grew up in alcoholic father and "protected [place] and always really close". 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 Therapist explored what brings her comfort at this moment, patient noted she is in a lot of pain  Daughters, cousins, RN, people to talk to but not the same as having her sister. Has been with mom 24-7 have to go back to work [employer] don't know how to function.  Therapist encouraged her with this as a treatment intervention and helpful . She has taken on mom's care for last year and half. To have a sudden loss. Think brought home the [place]. No spirituality and feels sister is gone. Mom just have moments. Keeps asking if sick and what is wrong. Feels that she is lying about [name] dying and lie and tell her that she is dying. Hard to talk  to her when ask hard questions. [name] choice not to tell her wouldn't remember cry every day. Biggest comfort is daughters, niece is here [name] at hospice and has been great, granddaughter.  Don't care about the future too much pain. Live in house of therapy dogs. In morning life gloomy have to get up animals to feed and after don't feel as bad.    Therapist reviewed  symptoms, facilitated expression of thoughts and feelings utilize this as important intervention given patient is dealing unexpected events that is leading to mom dying soon.  Reviewed ways that counseling can be helpful processing her feelings splinting in order to heal you have to feel.  With patient on guilt feelings challenging her causal relationship with mom's death saying guilt as one of the feelings that comes with grief and loss, can be considered bargaining and recognizing we do not place such as a large role in the universe that we responsible for such major life events, that this happens outside of her control.  His life unfolds there is more than our influence that cause events to happen.  Encourage patient to talk about her sister why she would be a good support for patient right now.  Validated on how patient was feeling but also noted she has other supports that are comforting for her right now and noted this is a very positive thing for patient.  Noted as well resources available and not to close off for them to come from different sources such as therapist in her own life experience that overlaps and has some things in common with patients that could be helpful well as knowledge about grief and loss.  Reviewed what is comforting for patient right now that includes her animals and her family support.  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  Therapist provided active listening open questions, supportive interventions.  Suicidal/Homicidal: No  Plan: Return again in 2 weeks.2.  Work on grief and anxiety management  Diagnosis: Axis I:  major depressive disorder, recurrent, moderate, generalized anxiety disorder, grief, trauma and stressor disorder    Axis II: No diagnosis    Cordella Register,  LCSW 01/26/2021

## 2021-01-28 ENCOUNTER — Other Ambulatory Visit (HOSPITAL_COMMUNITY): Payer: Self-pay

## 2021-01-28 ENCOUNTER — Encounter: Payer: Self-pay | Admitting: Medical-Surgical

## 2021-01-28 ENCOUNTER — Telehealth (INDEPENDENT_AMBULATORY_CARE_PROVIDER_SITE_OTHER): Payer: No Typology Code available for payment source | Admitting: Medical-Surgical

## 2021-01-28 DIAGNOSIS — F4320 Adjustment disorder, unspecified: Secondary | ICD-10-CM

## 2021-01-28 DIAGNOSIS — Z0289 Encounter for other administrative examinations: Secondary | ICD-10-CM

## 2021-01-28 NOTE — Progress Notes (Signed)
Virtual Visit via Video Note  I connected with Destiny Graham on 01/28/21 at  3:00 PM EDT by a video enabled telemedicine application and verified that I am speaking with the correct person using two identifiers.   I discussed the limitations of evaluation and management by telemedicine and the availability of in person appointments. The patient expressed understanding and agreed to proceed.  Patient location: Personal car Provider locations: office  Subjective:    CC: Discuss FMLA  HPI: Pleasant 65 year old female presenting via MyChart video visit to discuss FMLA.  She is currently caring for her elderly mother who is actively dying.  Her mother has progressed to requiring total care.  They have her at home and have no plans to place her in a facility.  She does have several family members that are able to help with the care required but she would like to take this time to be with her mother and family.  She is taking her Cymbalta as prescribed and using lorazepam as needed.  She started counseling and has had 2 visits with another one scheduled.  She would like to start her continuous absence on FMLA on 02/02/2021 with the expected timeline of returning after 4 weeks.  Depending on when her mother passes, this may need to be extended.  Past medical history, Surgical history, Family history not pertinant except as noted below, Social history, Allergies, and medications have been entered into the medical record, reviewed, and corrections made.   Review of Systems: See HPI for pertinent positives and negatives.   Objective:    General: Speaking clearly in complete sentences without any shortness of breath.  Alert and oriented x3.  Normal judgment. No apparent acute distress.  Impression and Recommendations:    1. Anticipatory grief 2. Encounter for completion of form with patient Advised patient to have the FMLA forms faxed over to our office and I will get those completed using the  information discussed.  Continue counseling as scheduled.  Continue Cymbalta and lorazepam as prescribed.  Advised patient to contact me should any needs arise.  I discussed the assessment and treatment plan with the patient. The patient was provided an opportunity to ask questions and all were answered. The patient agreed with the plan and demonstrated an understanding of the instructions.   The patient was advised to call back or seek an in-person evaluation if the symptoms worsen or if the condition fails to improve as anticipated.  20 minutes of non-face-to-face time was provided during this encounter.  Return if symptoms worsen or fail to improve.  Clearnce Sorrel, DNP, APRN, FNP-BC [facility]

## 2021-02-01 ENCOUNTER — Encounter: Payer: Self-pay | Admitting: Medical-Surgical

## 2021-02-05 ENCOUNTER — Emergency Department (INDEPENDENT_AMBULATORY_CARE_PROVIDER_SITE_OTHER): Payer: No Typology Code available for payment source

## 2021-02-05 ENCOUNTER — Encounter: Payer: Self-pay | Admitting: Emergency Medicine

## 2021-02-05 ENCOUNTER — Ambulatory Visit: Payer: Self-pay

## 2021-02-05 ENCOUNTER — Emergency Department
Admission: EM | Admit: 2021-02-05 | Discharge: 2021-02-05 | Disposition: A | Discharge status: Home / Self Care | Payer: No Typology Code available for payment source | Source: Home / Self Care

## 2021-02-05 DIAGNOSIS — S92502B Displaced unspecified fracture of left lesser toe(s), initial encounter for open fracture: Secondary | ICD-10-CM | POA: Diagnosis not present

## 2021-02-05 DIAGNOSIS — S90122A Contusion of left lesser toe(s) without damage to nail, initial encounter: Secondary | ICD-10-CM

## 2021-02-05 DIAGNOSIS — S92502A Displaced unspecified fracture of left lesser toe(s), initial encounter for closed fracture: Secondary | ICD-10-CM

## 2021-02-05 NOTE — Discharge Instructions (Addendum)
Advised/instructed patient to wear postop shoe 24/7 (except when bathing) until evaluated by orthopedic provider early next week.  Patient reports she will use ibuprofen as needed for pain.

## 2021-02-05 NOTE — ED Provider Notes (Signed)
[facility]    CSN: [id number] Arrival date & time: 02/05/21  1404      History   Chief Complaint Chief Complaint  Patient presents with  . Toe Injury    Possible broken    HPI Destiny Graham is a 65 y.o. female.   HPI Pleasant 65 year old female presents with left foot second and third toe injury.  Patient reports when attempting to her dog running outside, she accidentally struck her left foot, early second and third toes on adjacent door on Tuesday morning, 02/01/2021.  Past Medical History:  Diagnosis Date  . Anxiety    Phreesia 07/27/2020  . Arthritis    Phreesia 07/27/2020  . Breast nodule   . Cataract    Phreesia 07/27/2020  . DDD (degenerative disc disease), cervical   . Dysthymia   . Generalized OA   . GERD (gastroesophageal reflux disease)   . Hallux rigidus   . Heart murmur    beneign  . Hyperlipidemia   . Hypertension   . OSA on CPAP    Does not use a cpap  . Pseudophakia   . Sleep apnea    Phreesia 07/27/2020  . Thyroid disease   . Uterine fibroid     Patient Active Problem List   Diagnosis Date Noted  . Lung nodule seen on imaging study 07/28/2020  . Parotid mass 05/14/2020  . Elevated fasting glucose 05/21/2019  . Stress fracture of right calcaneus 06/04/2017  . Ingrown nail of great toe of right foot 06/04/2017  . Pain from implanted hardware 06/04/2017  . OSA on CPAP 10/10/2016  . GERD (gastroesophageal reflux disease) 07/26/2016  . DDD (degenerative disc disease), cervical 07/26/2016  . Generalized OA 07/26/2016  . Hypothyroidism 07/25/2016  . Essential hypertension 07/25/2016  . Hyperlipidemia 07/25/2016  . MDD (major depressive disorder), recurrent episode, moderate ([place]) 07/25/2016    Past Surgical History:  Procedure Laterality Date  . BUNIONECTOMY    . CATARACT EXTRACTION  2016  . EYE SURGERY    . FRACTURE SURGERY N/A    Phreesia 07/27/2020  . fusion   2015   first MTP B  . HARDWARE REMOVAL Left 06/15/2017    Procedure: Left Great Toe Hardware Removal;  Surgeon: Newt Minion, MD;  Location: [facility];  Service: Orthopedics;  Laterality: Left;  . MYOMECTOMY  1992  . ORIF FINGER FRACTURE  2006  . PAROTIDECTOMY Right 05/14/2020   Procedure: RIGHT SUPERFICIAL PAROTIDECTOMY;  Surgeon: Jerrell Belfast, MD;  Location: [facility];  Service: ENT;  Laterality: Right;  REQUESTING RNFA  . THYROIDECTOMY  2005  . TOENAIL EXCISION Right 06/15/2017   Procedure: Right Great Toe Excision Lateral Border Nail;  Surgeon: Newt Minion, MD;  Location: [facility];  Service: Orthopedics;  Laterality: Right;  . TONSILLECTOMY  1960    OB History   No obstetric history on file.      Home Medications    Prior to Admission medications   Medication Sig Start Date End Date Taking? Authorizing Provider  acetaminophen (TYLENOL) 500 MG tablet Take 1,000 mg by mouth every 6 (six) hours as needed for mild pain or headache.     [provider]  atenolol (TENORMIN) 25 MG tablet TAKE 1 TABLET BY MOUTH DAILY. 03/29/20 03/29/21  Alycia Rossetti, MD  DULoxetine (CYMBALTA) 60 MG capsule TAKE 2 CAPSULES BY MOUTH ONCE A DAY 10/26/20 10/26/21  Alycia Rossetti, MD  gabapentin (NEURONTIN) 300 MG capsule TAKE 1 CAPSULE BY MOUTH TWICE DAILY  Patient taking differently: Take 600 mg by mouth at bedtime. 03/29/20 04/24/21  Alycia Rossetti, MD  levothyroxine (SYNTHROID) 125 MCG tablet Take 1 tablet (125 mcg total) by mouth daily. 03/29/20   , Modena Nunnery, MD  LORazepam (ATIVAN) 1 MG tablet TAKE 1 TABLET BY MOUTH EVERY 8 HOURS AS NEEDED AS NEEDED FOR ANXIETY 12/31/20 06/29/21  Samuel Bouche, NP  omeprazole (PRILOSEC) 20 MG capsule Take 1 capsule (20 mg total) by mouth daily. 03/29/20   Alycia Rossetti, MD  rosuvastatin (CRESTOR) 20 MG tablet TAKE 1 TABLET BY MOUTH ONCE A DAY 08/10/20 08/10/21  Vic Blackbird F, MD  tretinoin (RETIN-A) 0.05 % cream APPLY ON TO THE SKIN EVERY NIGHT AT BEDTIME 04/05/20 04/05/21  Alycia Rossetti, MD  atorvastatin  (LIPITOR) 80 MG tablet Take 1 tablet (80 mg total) by mouth daily. 03/29/20 08/10/20  Alycia Rossetti, MD    Family History Family History  Problem Relation Age of Onset  . Arthritis Mother   . Depression Mother   . COPD Father   . Hearing loss Father   . Heart disease Father   . Hyperlipidemia Father   . Hypertension Father   . Kidney disease Father   . Alcohol abuse Father   . Depression Sister   . Alcohol abuse Sister   . Heart disease Maternal Grandmother        CHF  . COPD Maternal Grandfather   . Alcohol abuse Paternal Grandmother   . Depression Sister   . Heart disease Sister   . Hyperlipidemia Sister   . Depression Sister   . Heart disease Sister   . Hyperlipidemia Sister     Social History Social History   Tobacco Use  . Smoking status: Never Smoker  . Smokeless tobacco: Never Used  Vaping Use  . Vaping Use: Never used  Substance Use Topics  . Alcohol use: Yes    Alcohol/week: 6.0 - 7.0 standard drinks    Types: 6 - 7 Standard drinks or equivalent per week  . Drug use: Never     Allergies   Shingrix [zoster vac recomb adjuvanted], Vioxx [rofecoxib], Other, and Sulfa antibiotics   Review of Systems Review of Systems  Constitutional: Negative.   HENT: Negative.   Eyes: Negative.   Respiratory: Negative.   Cardiovascular: Negative.   Gastrointestinal: Negative.   Genitourinary: Negative.   Musculoskeletal:       Left foot second and third toe pain  Neurological: Negative.      Physical Exam Triage Vital Signs ED Triage Vitals [02/05/21 1410]  Enc Vitals Group     BP      Pulse      Resp      Temp      Temp src      SpO2      Weight      Height      Head Circumference      Peak Flow      Pain Score 2     Pain Loc      Pain Edu?      Excl. in [place]?    No data found.  Updated Vital Signs There were no vitals taken for this visit.   Physical Exam Constitutional:      General: She is not in acute distress.    Appearance:  Normal appearance. She is not ill-appearing.  HENT:     Head: Normocephalic and atraumatic.     Mouth/Throat:  Mouth: Mucous membranes are moist.     Pharynx: Oropharynx is clear.  Eyes:     Extraocular Movements: Extraocular movements intact.     Conjunctiva/sclera: Conjunctivae normal.     Pupils: Pupils are equal, round, and reactive to light.  Musculoskeletal:     Comments: Left foot: Second and third toe, TTP and mild to moderate soft tissue swelling noted  Skin:    Comments: Left foot: Second and third toe minimal bruising noted  Neurological:     General: No focal deficit present.     Mental Status: She is alert and oriented to person, place, and time.  Psychiatric:        Mood and Affect: Mood normal.        Behavior: Behavior normal.      UC Treatments / Results  Labs (all labs ordered are listed, but only abnormal results are displayed) Labs Reviewed - No data to display  EKG   Radiology DG Foot Complete Left  Result Date: 02/05/2021 CLINICAL DATA:  65 year old female with bruising of the second and third toe. EXAM: LEFT FOOT - COMPLETE 3+ VIEW COMPARISON:  None. FINDINGS: Focal osseous density about the lateral aspect of the second distal interphalangeal joint. Nondisplaced, horizontally oriented fracture of the proximal aspect of the third proximal phalanx. Ankylosis of the first metatarsophalangeal joint with ghost tracks from prior operative hardware. Soft tissues are unremarkable. IMPRESSION: 1. Acute, nondisplaced fracture of the third proximal phalanx. 2. Focal ossific density about the lateral aspect of the second distal interphalangeal joint suggestive of avulsion injury. Electronically Signed   By: Ruthann Cancer MD   On: 02/05/2021 14:44    Procedures Procedures (including critical care time)  Medications Ordered in UC Medications - No data to display  Initial Impression / Assessment and Plan / UC Course  I have reviewed the triage vital signs and  the nursing notes.  Pertinent labs & imaging results that were available during my care of the patient were reviewed by me and considered in my medical decision making (see chart for details).     MDM: 1.  Acute nondisplaced fracture of third proximal phalanx, 2.  Avulsion fracture of second distal interphalangeal joint.  Patient placed in left foot postop shoe to immobilize second and third toe prior to discharge.  Patient will follow-up with her orthopedic provider on Tuesday, 02/08/2021.  Patient discharged home, hemodynamically stable Final Clinical Impressions(s) / UC Diagnoses   Final diagnoses:  Closed fracture of phalanx of left third toe, initial encounter  Open fracture of phalanx of left second toe, initial encounter     Discharge Instructions     Advised/instructed patient to wear postop shoe 24/7 (except when bathing) until evaluated by orthopedic provider early next week.  Patient reports she will use ibuprofen as needed for pain.    ED Prescriptions    None     PDMP not reviewed this encounter.   Eliezer Lofts, [place] 02/05/21 1818

## 2021-02-05 NOTE — ED Triage Notes (Signed)
Hit left  2nd & 3rd toe on door on 02/01/21 [place] noted

## 2021-02-09 ENCOUNTER — Encounter: Payer: Self-pay | Admitting: Medical-Surgical

## 2021-02-09 ENCOUNTER — Ambulatory Visit (INDEPENDENT_AMBULATORY_CARE_PROVIDER_SITE_OTHER): Payer: No Typology Code available for payment source | Admitting: Licensed Clinical Social Worker

## 2021-02-09 DIAGNOSIS — F331 Major depressive disorder, recurrent, moderate: Secondary | ICD-10-CM

## 2021-02-09 DIAGNOSIS — F410 Panic disorder [episodic paroxysmal anxiety] without agoraphobia: Secondary | ICD-10-CM

## 2021-02-09 DIAGNOSIS — F439 Reaction to severe stress, unspecified: Secondary | ICD-10-CM

## 2021-02-09 DIAGNOSIS — F4321 Adjustment disorder with depressed mood: Secondary | ICD-10-CM

## 2021-02-09 DIAGNOSIS — F411 Generalized anxiety disorder: Secondary | ICD-10-CM | POA: Diagnosis not present

## 2021-02-09 NOTE — Progress Notes (Signed)
Virtual Visit via Video Note  I connected with Destiny Graham on 02/09/21 at  2:00 PM EDT by a video enabled telemedicine application and verified that I am speaking with the correct person using two identifiers.  Location: Patient: home Provider: home office   I discussed the limitations of evaluation and management by telemedicine and the availability of in person appointments. The patient expressed understanding and agreed to proceed.  I discussed the assessment and treatment plan with the patient. The patient was provided an opportunity to ask questions and all were answered. The patient agreed with the plan and demonstrated an understanding of the instructions.   The patient was advised to call back or seek an in-person evaluation if the symptoms worsen or if the condition fails to improve as anticipated.  I provided 55 minutes of non-face-to-face time during this encounter.  THERAPIST PROGRESS NOTE  Session Time: 2:00 PM to 2:55 PM  Participation Level: Active  Behavioral Response: CasualAlertDepressed  Type of Therapy: Individual Therapy  Treatment Goals addressed:  Anxiety, grief, coping Interventions: Solution Focused, Strength-based, Supportive and Other: grief, stress management, relationships  Summary: Destiny Graham is a 65 y.o. female who presents with privilege to be with someone as they are dying as how she looks at being with her mom. Mom has passed. Mom and Dad wishes to be cremated. Take Mom to be with Dad at sister's farm in [place].. Not sure when it will happen this spring time. Lost track of time with everything that has gone on. Broke two toes on top of everything. Off currently scheduled in a week. Taking it week to week. Had a tearful spell which is unlike her. It involved a picture of daughter when younger she was the one who cared for mom everyday. That was her job. She is the one married to heroin addict they are relying on her salary which was coming from  taking care of mom and no longer have. Looking at picture when young and asking what went wrong. Other daughter input was she is enabling her to be dysfunction other daughter says her younger taking advantage. We were her full time job doesn't have so giving her a severance two weeks until find another job. Thinks kind and appropriate. Therapist agreed. She didn't come one day they had conflict and had to clarify the misunderstanding. Pay her and still work at the house. Want to get her in counseling. Needs to pick up and move on and not doing this assiduously. Cried because what happened and how did she end down this road. Patient feeling is protect her from failure daughter says let her fail has to make it on her own but concern is she won't. It is hard to talk about this now. Forced to deal with it mom died daughter lost her employer and grandmother and she has to move and hard for all of [place]. Daughter has been with husband for three years says clean for a year. He loses one job after another never his fault daughter, Destiny Graham, believes him. Why can't she see he is failing over and over and that being an issue. He is a big handsome guy and lavishes all over her. Can't change her have to wait to she is ready to change, patient has to work on drawing the line between the support and enabling and she has a hard time with that. Since in between jobs quit salary but do car payments. They are living in [place] home so that  helps with paying rent. Cried for awhile and called daughter and that helped. Her sister, [name], who was her major support system and now deceased not helping her through losses. Explored what advice she would give to Destiny Graham, her younger daugher. Destiny Graham lives in [place] didn't go to her hard to relate recovered alcoholic hyper-religiousity. When she and Destiny Graham mom dying and they were chatting. Not ok patient thought. The two sisters were reconnecting one in [place] and one in [place]. They were not upset as  should be patient thought. Doesn't know if better foundation with faith. Was patient and Destiny Graham against Destiny Graham and Destiny Graham. For people who are religious think they live in happy ever after and not the same for patient. When gone they are gone. Sister doing tasks to make the house look better and make sure patient ok, eating. "Lost" is the word to describe how she feels. Don't know where to turn and what to do. Continue trying and want to do better. Counseling with getting on and helping to deal with the stresses of life. Where started counseling and then  lost mom. Lost mom, increased problems and broke her toes. Missed and grieved her sister so much at five months thought grieving too much why counseling. Therapy worked the best was during her divorce wanted to marry and have kids. He was an Youth worker and Dad was alcoholic. Choose a domestic Hydrologist. What is wrong with her that she choose him? Stay in there 6 years and didn't recognize child of alcoholic. Repeating patterns.        Therapist reviewed symptoms, facilitated expression of thoughts and feelings help patient is the main treatment intervention to process feelings related to stressors with her daughter.  Noted keeping a balance between support but not enabling and slowly cutting off so she can be independent.  Therapist provided her perspective that she can get on course needs to work on her issues including self-esteem, patient's input towards helpful and described motivational interviewing that people have to develop their own motivation but we can help with our input and helpful patient is talking to her to try to give her insight.  Validated patient how she was feeling of being a mom and wanting to be supportive and therapist provided her perspective that being supportive can be good parenting.  Noted patient and her own grief to use her strengths and strategies that have helped her to cope.  Also talked about grief work in general that it is  processing of grief through expressing the feelings associated with lots which can occur in several dimensions: Physical, emotional, social and spiritual this work helps in the healing process by allowing the person to get in touch with feelings, internalizing and processing them.  Noted patient crying and releasing feelings as cathartic helpful and releasing emotions and working through emotions.  Noted helpful in the past and therapy with therapist shares insights about patient that she had been unaware of that helped her in making change.  Therapist provided active listening, open questions supportive interventions. Suicidal/Homicidal: No  Plan: Return again in 2 weeks.2.  Work on stressors such as concerned about her younger daughter, work on grief work, coping  Diagnosis: Axis I:   major depressive disorder, recurrent, moderate, generalized anxiety disorder, grief, trauma and stressor disorder    Axis II: No diagnosis    Cordella Register, [place] 02/09/2021

## 2021-02-15 ENCOUNTER — Other Ambulatory Visit (HOSPITAL_COMMUNITY): Payer: Self-pay

## 2021-02-15 ENCOUNTER — Other Ambulatory Visit: Payer: Self-pay | Admitting: Medical-Surgical

## 2021-02-15 MED ORDER — LORAZEPAM 1 MG PO TABS
ORAL_TABLET | ORAL | 1 refills | Status: AC
  Filled 2021-02-15: qty 30, 10d supply, fill #0
  Filled 2021-02-28: qty 30, 10d supply, fill #1

## 2021-02-23 ENCOUNTER — Other Ambulatory Visit (HOSPITAL_COMMUNITY): Payer: Self-pay

## 2021-02-23 ENCOUNTER — Encounter: Payer: Self-pay | Admitting: Medical-Surgical

## 2021-02-23 MED ORDER — OMEPRAZOLE 20 MG PO CPDR
20.0000 mg | DELAYED_RELEASE_CAPSULE | Freq: Every day | ORAL | 1 refills | Status: DC
  Filled 2021-02-23: qty 90, 90d supply, fill #0
  Filled 2021-05-17: qty 90, 90d supply, fill #1

## 2021-02-23 MED ORDER — LEVOTHYROXINE SODIUM 125 MCG PO TABS
125.0000 ug | ORAL_TABLET | Freq: Every day | ORAL | 1 refills | Status: DC
  Filled 2021-02-23: qty 90, 90d supply, fill #0
  Filled 2021-05-17: qty 90, 90d supply, fill #1

## 2021-02-23 MED FILL — Rosuvastatin Calcium Tab 20 MG: ORAL | 90 days supply | Qty: 90 | Fill #0 | Status: AC

## 2021-02-28 ENCOUNTER — Other Ambulatory Visit (HOSPITAL_COMMUNITY): Payer: Self-pay

## 2021-03-01 ENCOUNTER — Ambulatory Visit (INDEPENDENT_AMBULATORY_CARE_PROVIDER_SITE_OTHER): Payer: No Typology Code available for payment source | Admitting: Licensed Clinical Social Worker

## 2021-03-01 DIAGNOSIS — F331 Major depressive disorder, recurrent, moderate: Secondary | ICD-10-CM | POA: Diagnosis not present

## 2021-03-01 DIAGNOSIS — F411 Generalized anxiety disorder: Secondary | ICD-10-CM

## 2021-03-01 DIAGNOSIS — F410 Panic disorder [episodic paroxysmal anxiety] without agoraphobia: Secondary | ICD-10-CM

## 2021-03-01 DIAGNOSIS — F439 Reaction to severe stress, unspecified: Secondary | ICD-10-CM

## 2021-03-01 NOTE — Progress Notes (Addendum)
Virtual Visit via Video Note  I connected with Destiny Graham on 03/01/21 at 10:00 AM EDT by a video enabled telemedicine application and verified that I am speaking with the correct person using two identifiers.  Location: Patient: home Provider: home office   I discussed the limitations of evaluation and management by telemedicine and the availability of in person appointments. The patient expressed understanding and agreed to proceed.  I discussed the assessment and treatment plan with the patient. The patient was provided an opportunity to ask questions and all were answered. The patient agreed with the plan and demonstrated an understanding of the instructions.   The patient was advised to call back or seek an in-person evaluation if the symptoms worsen or if the condition fails to improve as anticipated.  I provided 54 minutes of non-face-to-face time during this encounter.  THERAPIST PROGRESS NOTE  Session Time: 10:00 AM to 10:54 AM  Participation Level: Active  Behavioral Response: CasualAlertDepressed  Type of Therapy: Individual Therapy  Treatment Goals addressed:  Anxiety, grief, coping Interventions: Solution Focused, Strength-based, Supportive, and Other: grief, coping  Summary: Destiny Graham is a 65 y.o. female who presents with being out of work for hour weeks and that has been good but feels guilty everyone working for her but also tells herself to get over it not indispensable. Therapist also encouraged her to enjoy these moments as they do not come often so see it also as a benefit as well. Relates labile right now crying for no reason. Sister and her decided to go to [employer]. Cried when talking to a lady in store who lost her mother as well. Not like her.  Therapist guided patient has process of feeling of grief to feel as to heal release of emotions help with processing through feelings so reframed it is something that is helpful in the process.  Daughter  yelled at her for letting cats at. Made her cry patient relates can't yell at her now. Very fragile. Wants to get to the point where with [name] and Mom see things remind of them remember then and not hurt. Everything remind her of Mom.  Therapist noted ways they are part of [place], through genes thought what they gave [place] from growing up. Therapist shared helpful to share memories in session and relates she realizes helpful to remember but right now choose not to. A good note daughter has found a good job. Starts on July 16 patient says it will get her away from trailor of people and husband and that will be good for her. 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 Her motivation picked up which is good. Asked daughter what is her plan to help her in reflecting about her situation looking at whether she wants to support her husband and daughter said no. Absence would be helpful. Sister [name] here since Mom died. Mostly she helps sister has strong faith. Patient explains that she helps her by helping her put one foot after another feed chickens, flowers to plant keep her living and moving.  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   Therapist reviewed symptoms, facilitated expression of thoughts and feelings utilize reframing to look at patient crying is actually healing that she will have to feel to heal release of emotions helpful for working through  emotions.  Provided a quote for patient to reflect on that could help with her grieving process reflecting getting her response that when we lose someone we love we can decide how we want to bring it into her life.  Do we want to dwell on it, do we want to find the best part of that person, that experience and keep it with [place].  Noted patient's goal of getting to a place of focusing  more on positive memories, therapist knowledge this is a good goal but also recognizing having to work through difficult feelings.  Noted what is working a sister who keeps her moving forward, therapist also noted patient having her strengths and resources and not forgetting that.  Noted helpful to be on a farm, things that patient loves such as the chickens horseback riding helps.  Therapist utilized metaphors to help patient with insight to help with coping including grief is like a wound, continuing balance that life grows around the grief 1 begins to recognize moments of joy helps that there will be other more positive experiences, noted dual process model as well will refocus more on present future and able to contain the loss.  Noted is positive patient says doing better.  Therapist provided active listening, open questions supportive interventions. Suicidal/Homicidal: No  Plan: Return again in 2 weeks.2.Look for [facility] quote on grief for patient, look at grief pamphlet from psychology tool, utilize strategies from work for transforming grief, focus on stressors patient needs to talk about, coping, work on patient recognizing her internal source of resources (self-esteem)  Diagnosis: Axis I: major depressive disorder, recurrent, moderate, generalized anxiety disorder, grief, trauma and sressor    Axis II: No diagnosis    Cordella Register, LCSW 03/01/2021

## 2021-03-07 ENCOUNTER — Other Ambulatory Visit: Payer: Self-pay | Admitting: Medical-Surgical

## 2021-03-07 DIAGNOSIS — S99922A Unspecified injury of left foot, initial encounter: Secondary | ICD-10-CM

## 2021-03-07 NOTE — Progress Notes (Signed)
Patient injured her foot while taking care of her terminally ill mother. Her foot has continued to hurt despite conservative care. Getting left foot x-rays to evaluate continued pain.   Clearnce Sorrel, DNP, APRN, FNP-BC [facility]

## 2021-03-08 ENCOUNTER — Other Ambulatory Visit: Payer: Self-pay

## 2021-03-08 ENCOUNTER — Ambulatory Visit (INDEPENDENT_AMBULATORY_CARE_PROVIDER_SITE_OTHER): Payer: No Typology Code available for payment source

## 2021-03-08 DIAGNOSIS — S99922A Unspecified injury of left foot, initial encounter: Secondary | ICD-10-CM | POA: Diagnosis not present

## 2021-03-16 ENCOUNTER — Encounter: Payer: Self-pay | Admitting: Medical-Surgical

## 2021-03-16 ENCOUNTER — Other Ambulatory Visit: Payer: Self-pay

## 2021-03-17 ENCOUNTER — Other Ambulatory Visit: Payer: Self-pay | Admitting: Medical-Surgical

## 2021-03-17 ENCOUNTER — Ambulatory Visit (HOSPITAL_COMMUNITY): Payer: No Typology Code available for payment source | Admitting: Licensed Clinical Social Worker

## 2021-03-17 ENCOUNTER — Other Ambulatory Visit (HOSPITAL_COMMUNITY): Payer: Self-pay

## 2021-03-21 ENCOUNTER — Telehealth (INDEPENDENT_AMBULATORY_CARE_PROVIDER_SITE_OTHER): Payer: No Typology Code available for payment source | Admitting: Medical-Surgical

## 2021-03-21 ENCOUNTER — Other Ambulatory Visit (HOSPITAL_COMMUNITY): Payer: Self-pay

## 2021-03-21 ENCOUNTER — Encounter: Payer: Self-pay | Admitting: Medical-Surgical

## 2021-03-21 DIAGNOSIS — F419 Anxiety disorder, unspecified: Secondary | ICD-10-CM

## 2021-03-21 DIAGNOSIS — F5104 Psychophysiologic insomnia: Secondary | ICD-10-CM | POA: Diagnosis not present

## 2021-03-21 DIAGNOSIS — F331 Major depressive disorder, recurrent, moderate: Secondary | ICD-10-CM | POA: Diagnosis not present

## 2021-03-21 DIAGNOSIS — F4321 Adjustment disorder with depressed mood: Secondary | ICD-10-CM

## 2021-03-21 MED ORDER — SUVOREXANT 20 MG PO TABS
20.0000 mg | ORAL_TABLET | Freq: Every evening | ORAL | 1 refills | Status: DC | PRN
  Filled 2021-03-21: qty 30, 30d supply, fill #0

## 2021-03-21 MED ORDER — BUSPIRONE HCL 5 MG PO TABS
5.0000 mg | ORAL_TABLET | Freq: Three times a day (TID) | ORAL | 3 refills | Status: DC | PRN
  Filled 2021-03-21: qty 270, 30d supply, fill #0

## 2021-03-21 NOTE — Progress Notes (Signed)
Virtual Visit via Video Note  I connected with Destiny Graham on 03/21/21 at 11:10 AM EDT by a video enabled telemedicine application and verified that I am speaking with the correct person using two identifiers.   I discussed the limitations of evaluation and management by telemedicine and the availability of in person appointments. The patient expressed understanding and agreed to proceed.  Patient location: home Provider locations: office  Subjective:    CC: mood follow up  HPI: Pleasant 65 year old female presenting via MyChart video visit to discuss anxiety, depression, and grief. She recently lost her sister followed by her mother. This has exacerbated her long-standing concerns with anxiety and depression. She reports having good days and bad days. Her days at work tend to be better as she has the distractions and keeps busy. Her days at home are hard since she is alone. Feels almost like she is empty nesting for the first time since her daughter was home until her mother moved in and now they are both gone. She is having trouble sleeping and finds herself tossing and turning. She often dreams and wakes up with a tightness in the center of her chest. This tightness happens during the day as well. She has turned to ETOH intake to manage. She knows that her family history involves substance abuse in several family members so is watchful about her own use. She has tried counseling several times over the years, most recently connecting with a counselor that she found not helpful and did not feel a connection with. She is interested in trying to find a new counselor but has bet several road blocks between availability, scheduling, and those that are not taking new patients. In her history, she has tried multiple SSRIs and sleeping medications. Now taking Cymbalta 120mg  daily but wonders if she should split the dosing to 60mg  BID. If she waits too long to take her dose, she does feels some withdrawal  symptoms.    Past medical history, Surgical history, Family history not pertinant except as noted below, Social history, Allergies, and medications have been entered into the medical record, reviewed, and corrections made.   Review of Systems: See HPI for pertinent positives and negatives.   Depression screen [facility] 2/9 03/21/2021 11/08/2020 07/28/2020 05/21/2019 04/10/2018  Decreased Interest 3 2 3 3  0  Down, Depressed, Hopeless 3 2 2 3  0  PHQ - 2 Score 6 4 5 6  0  Altered sleeping 3 2 3 3  -  Tired, decreased energy 2 2 2 3  -  Change in appetite 1 2 0 3 -  Feeling bad or failure about yourself  1 2 2 3  -  Trouble concentrating 0 0 0 0 -  Moving slowly or fidgety/restless 0 0 0 0 -  Suicidal thoughts 0 0 0 0 -  PHQ-9 Score 13 12 12 18  -  Difficult doing work/chores Somewhat difficult Somewhat difficult Very difficult Very difficult -  Some encounter information is confidential and restricted. Go to Review Flowsheets activity to see all data.   GAD 7 : Generalized Anxiety Score 03/21/2021 11/08/2020 05/21/2019  Nervous, Anxious, on Edge 3 2 3   Control/stop worrying 1 2 3   Worry too much - different things 3 2 3   Trouble relaxing 3 2 0  Restless 0 0 0  Easily annoyed or irritable 0 1 3  Afraid - awful might happen 0 0 3  Total GAD 7 Score 10 9 15   Anxiety Difficulty - Somewhat difficult Very difficult  Objective:    General: Speaking clearly in complete sentences without any shortness of breath.  Alert and oriented x3.  Normal judgment. No apparent acute distress.  Impression and Recommendations:    1. MDD (major depressive disorder), recurrent episode, moderate ([place]) 2. Grief reaction 3. Anxiety Continue Cymbalta 120mg  daily. Ok to split dosing to 60mg  BID to see if this better controls symptoms. Start BuSpar 5-15mg  TID prn anxiety. Continue Lorazepam 1mg  sparingly for severe anxiety, avoid using for sleep. Advised patient to check with her insurance regarding a counselor that is in  network and let me know so I can enter a referral for her.   4. Psychophysiological insomnia Start Belsomra 20mg  at bedtime prn.   I discussed the assessment and treatment plan with the patient. The patient was provided an opportunity to ask questions and all were answered. The patient agreed with the plan and demonstrated an understanding of the instructions.   The patient was advised to call back or seek an in-person evaluation if the symptoms worsen or if the condition fails to improve as anticipated.  20 minutes of non-face-to-face time was provided during this encounter.  Return in about 4 weeks (around 04/18/2021) for mood/insomnia follow up.  Clearnce Sorrel, DNP, APRN, FNP-BC [facility]

## 2021-03-22 ENCOUNTER — Other Ambulatory Visit (HOSPITAL_COMMUNITY): Payer: Self-pay

## 2021-03-30 ENCOUNTER — Ambulatory Visit (HOSPITAL_COMMUNITY): Payer: No Typology Code available for payment source | Admitting: Licensed Clinical Social Worker

## 2021-04-13 ENCOUNTER — Ambulatory Visit (HOSPITAL_COMMUNITY): Payer: No Typology Code available for payment source | Admitting: Licensed Clinical Social Worker

## 2021-04-14 ENCOUNTER — Encounter: Payer: Self-pay | Admitting: Medical-Surgical

## 2021-04-14 ENCOUNTER — Other Ambulatory Visit: Payer: Self-pay

## 2021-04-14 ENCOUNTER — Other Ambulatory Visit (HOSPITAL_COMMUNITY): Payer: Self-pay

## 2021-04-15 ENCOUNTER — Other Ambulatory Visit (HOSPITAL_COMMUNITY): Payer: Self-pay

## 2021-04-18 ENCOUNTER — Other Ambulatory Visit (HOSPITAL_COMMUNITY): Payer: Self-pay

## 2021-04-22 ENCOUNTER — Encounter: Payer: Self-pay | Admitting: Medical-Surgical

## 2021-04-22 ENCOUNTER — Other Ambulatory Visit: Payer: Self-pay

## 2021-04-22 ENCOUNTER — Other Ambulatory Visit (HOSPITAL_COMMUNITY): Payer: Self-pay

## 2021-04-22 MED ORDER — ATENOLOL 25 MG PO TABS: ORAL_TABLET | Freq: Every day | ORAL | 0 refills | Status: DC

## 2021-04-22 MED ORDER — DULOXETINE HCL 60 MG PO CPEP
ORAL_CAPSULE | Freq: Every day | ORAL | 0 refills | Status: DC
Start: 2021-04-22 — End: 2021-08-09

## 2021-04-22 MED ORDER — DULOXETINE HCL 60 MG PO CPEP
ORAL_CAPSULE | Freq: Every day | ORAL | 1 refills | Status: DC
  Filled 2021-04-22: qty 180, 90d supply, fill #0

## 2021-04-22 MED ORDER — GABAPENTIN 300 MG PO CAPS: 300.0000 mg | ORAL_CAPSULE | Freq: Two times a day (BID) | ORAL | 0 refills | Status: DC

## 2021-04-22 MED ORDER — ATENOLOL 25 MG PO TABS
ORAL_TABLET | Freq: Every day | ORAL | 1 refills | Status: DC
  Filled 2021-04-22: qty 90, 90d supply, fill #0

## 2021-04-22 MED ORDER — GABAPENTIN 300 MG PO CAPS
300.0000 mg | ORAL_CAPSULE | Freq: Two times a day (BID) | ORAL | 1 refills | Status: DC
  Filled 2021-04-22: qty 180, 90d supply, fill #0

## 2021-04-22 NOTE — Telephone Encounter (Signed)
Never prescribed by [facility].  

## 2021-04-27 ENCOUNTER — Other Ambulatory Visit: Payer: Self-pay

## 2021-04-27 ENCOUNTER — Ambulatory Visit (INDEPENDENT_AMBULATORY_CARE_PROVIDER_SITE_OTHER): Payer: No Typology Code available for payment source | Admitting: Family Medicine

## 2021-04-27 ENCOUNTER — Encounter: Payer: Self-pay | Admitting: Family Medicine

## 2021-04-27 ENCOUNTER — Ambulatory Visit: Payer: Self-pay

## 2021-04-27 VITALS — BP 160/90 | Ht 67.0 in | Wt 160.0 lb

## 2021-04-27 DIAGNOSIS — S92515A Nondisplaced fracture of proximal phalanx of left lesser toe(s), initial encounter for closed fracture: Secondary | ICD-10-CM | POA: Diagnosis not present

## 2021-04-27 NOTE — Assessment & Plan Note (Signed)
Initial injury on 5/24.  Still appears to have an ongoing healing fracture.  Appears that the second and third phalanx have become more of the step-off point with the fused great toe.  -Counseled on home exercise therapy and supportive care. -Green sport insoles with metatarsal pads.  Applied blue Poron to the left forefoot.  Applied a hammertoe crest - Pennsaid samples

## 2021-04-27 NOTE — Progress Notes (Signed)
Shenell Leith - 65 y.o. female MRN [id number]  Date of birth: Jan 02, 1956  SUBJECTIVE:  Including CC & ROS.  No chief complaint on file.   Ziyon Davtyan is a 65 y.o. female that is so pain.  She had a fracture that occurred a few months ago.  Still having ongoing swelling and pain over the area.  Has had a normal bone density.  Has tried buddy taping.  Independent review of the left foot x-ray from 6/28 shows a proximal phalanx fracture in the midshaft.   Review of Systems See HPI   HISTORY: Past Medical, Surgical, Social, and Family History Reviewed & Updated per EMR.   Pertinent Historical Findings include:  Past Medical History:  Diagnosis Date   Anxiety    Phreesia 07/27/2020   Arthritis    Phreesia 07/27/2020   Breast nodule    Cataract    Phreesia 07/27/2020   DDD (degenerative disc disease), cervical    Dysthymia    Generalized OA    GERD (gastroesophageal reflux disease)    Hallux rigidus    Heart murmur    beneign   Hyperlipidemia    Hypertension    OSA on CPAP    Does not use a cpap   Pseudophakia    Sleep apnea    Phreesia 07/27/2020   Thyroid disease    Uterine fibroid     Past Surgical History:  Procedure Laterality Date   BUNIONECTOMY     CATARACT EXTRACTION  2016   EYE SURGERY     FRACTURE SURGERY N/A    Phreesia 07/27/2020   fusion   2015   first MTP B   HARDWARE REMOVAL Left 06/15/2017   Procedure: Left Great Toe Hardware Removal;  Surgeon: Newt Minion, MD;  Location: [place];  Service: Orthopedics;  Laterality: Left;   MYOMECTOMY  1992   ORIF FINGER FRACTURE  2006   PAROTIDECTOMY Right 05/14/2020   Procedure: RIGHT SUPERFICIAL PAROTIDECTOMY;  Surgeon: Jerrell Belfast, MD;  Location: [place];  Service: ENT;  Laterality: Right;  REQUESTING RNFA   THYROIDECTOMY  2005   TOENAIL EXCISION Right 06/15/2017   Procedure: Right Great Toe Excision Lateral Border Nail;  Surgeon: Newt Minion, MD;  Location: [place];  Service: Orthopedics;  Laterality:  Right;   TONSILLECTOMY  1960    Family History  Problem Relation Age of Onset   Arthritis Mother    Depression Mother    COPD Father    Hearing loss Father    Heart disease Father    Hyperlipidemia Father    Hypertension Father    Kidney disease Father    Alcohol abuse Father    Depression Sister    Alcohol abuse Sister    Heart disease Maternal Grandmother        CHF   COPD Maternal Grandfather    Alcohol abuse Paternal Grandmother    Depression Sister    Heart disease Sister    Hyperlipidemia Sister    Depression Sister    Heart disease Sister    Hyperlipidemia Sister     Social History   Socioeconomic History   Marital status: Divorced    Spouse name: Not on file   Number of children: Not on file   Years of education: Not on file   Highest education level: Not on file  Occupational History   Not on file  Tobacco Use   Smoking status: Never   Smokeless tobacco: Never  Vaping Use   Vaping  Use: Never used  Substance and Sexual Activity   Alcohol use: Yes    Alcohol/week: 6.0 - 7.0 standard drinks    Types: 6 - 7 Standard drinks or equivalent per week   Drug use: Never   Sexual activity: Not Currently  Other Topics Concern   Not on file  Social History Narrative   Not on file   Social Determinants of Health   Financial Resource Strain: Not on file  Food Insecurity: Not on file  Transportation Needs: Not on file  Physical Activity: Not on file  Stress: Not on file  Social Connections: Not on file  Intimate Partner Violence: Not on file     PHYSICAL EXAM:  VS: BP (!) 160/90 (BP Location: Left Arm, Patient Position: Sitting, Cuff Size: Normal)   Ht '5\' 7"'$  (1.702 m)   Wt 160 lb (72.6 kg)   BMI 25.06 kg/m  Physical Exam Gen: NAD, alert, cooperative with exam, well-appearing MSK:  Left foot: Swelling and pain at the third phalanx. Fused left great toe Neurovascular intact  Limited ultrasound: Left third toe:  Mild effusion of the MCP  joint. There appears to be ongoing fracture of the transverse proximal phalanx.  Increased hyperemia in this area.  Summary: Ongoing fracture proximal phalanx  Ultrasound and interpretation by Clearance Coots, MD    ASSESSMENT & PLAN:   Closed nondisplaced fracture of proximal phalanx of lesser toe of left foot Initial injury on 5/24.  Still appears to have an ongoing healing fracture.  Appears that the second and third phalanx have become more of the step-off point with the fused great toe.  -Counseled on home exercise therapy and supportive care. -Green sport insoles with metatarsal pads.  Applied blue Poron to the left forefoot.  Applied a hammertoe crest - Pennsaid samples

## 2021-05-17 ENCOUNTER — Other Ambulatory Visit (HOSPITAL_COMMUNITY): Payer: Self-pay

## 2021-05-17 ENCOUNTER — Encounter (INDEPENDENT_AMBULATORY_CARE_PROVIDER_SITE_OTHER): Payer: Self-pay

## 2021-05-18 ENCOUNTER — Other Ambulatory Visit (HOSPITAL_COMMUNITY): Payer: Self-pay

## 2021-05-19 ENCOUNTER — Other Ambulatory Visit: Payer: Self-pay | Admitting: Medical-Surgical

## 2021-06-06 ENCOUNTER — Encounter: Payer: Self-pay | Admitting: Medical-Surgical

## 2021-06-06 MED FILL — Rosuvastatin Calcium Tab 20 MG: ORAL | 90 days supply | Qty: 90 | Fill #1 | Status: AC

## 2021-06-07 ENCOUNTER — Other Ambulatory Visit (HOSPITAL_COMMUNITY): Payer: Self-pay

## 2021-06-07 NOTE — Telephone Encounter (Signed)
Patient is now interested in completing her HST that was ordered by [facility] last year in October 2021.    Since it has been almost a year, will she need another OV for documentation and order for HST? I have already told her that the pre-certification will need to be done again. PCCs, can you all please advise? Thanks!

## 2021-06-15 NOTE — Telephone Encounter (Signed)
[facility] states this should be fine.  She will get a new prior auth and then we will contact pt to schedule.  Will route back to triage so they can let pt know thru [place].

## 2021-07-03 ENCOUNTER — Other Ambulatory Visit: Payer: Self-pay | Admitting: Medical-Surgical

## 2021-07-03 ENCOUNTER — Encounter: Payer: Self-pay | Admitting: Medical-Surgical

## 2021-07-04 ENCOUNTER — Other Ambulatory Visit (HOSPITAL_COMMUNITY): Payer: Self-pay

## 2021-07-04 MED ORDER — GABAPENTIN 300 MG PO CAPS
300.0000 mg | ORAL_CAPSULE | Freq: Two times a day (BID) | ORAL | 0 refills | Status: DC
  Filled 2021-07-04: qty 180, 90d supply, fill #0

## 2021-07-11 ENCOUNTER — Other Ambulatory Visit (HOSPITAL_COMMUNITY): Payer: Self-pay

## 2021-08-07 ENCOUNTER — Other Ambulatory Visit: Payer: Self-pay | Admitting: Medical-Surgical

## 2021-08-07 ENCOUNTER — Encounter: Payer: Self-pay | Admitting: Medical-Surgical

## 2021-08-08 ENCOUNTER — Other Ambulatory Visit: Payer: Self-pay | Admitting: Medical-Surgical

## 2021-08-09 ENCOUNTER — Other Ambulatory Visit (HOSPITAL_COMMUNITY): Payer: Self-pay

## 2021-08-09 MED ORDER — ROSUVASTATIN CALCIUM 20 MG PO TABS
ORAL_TABLET | Freq: Every day | ORAL | 3 refills | Status: DC
  Filled 2021-08-09: qty 90, fill #0
  Filled 2021-09-16: qty 90, 90d supply, fill #0
  Filled 2021-12-17: qty 90, 90d supply, fill #1
  Filled 2022-03-12: qty 90, 90d supply, fill #2
  Filled 2022-06-10: qty 90, 90d supply, fill #3

## 2021-08-09 MED ORDER — BUSPIRONE HCL 5 MG PO TABS
5.0000 mg | ORAL_TABLET | Freq: Three times a day (TID) | ORAL | 3 refills | Status: DC | PRN
  Filled 2021-08-09: qty 270, 30d supply, fill #0

## 2021-08-09 MED ORDER — GABAPENTIN 300 MG PO CAPS
300.0000 mg | ORAL_CAPSULE | Freq: Two times a day (BID) | ORAL | 1 refills | Status: DC
  Filled 2021-08-09: qty 180, 90d supply, fill #0

## 2021-08-09 MED ORDER — DULOXETINE HCL 60 MG PO CPEP
ORAL_CAPSULE | Freq: Every day | ORAL | 1 refills | Status: DC
  Filled 2021-08-09: qty 180, 90d supply, fill #0
  Filled 2021-11-29: qty 180, 90d supply, fill #1

## 2021-08-09 MED ORDER — ATENOLOL 25 MG PO TABS
25.0000 mg | ORAL_TABLET | Freq: Every day | ORAL | 1 refills | Status: DC
  Filled 2021-08-09: qty 90, 90d supply, fill #0

## 2021-08-09 MED ORDER — LEVOTHYROXINE SODIUM 125 MCG PO TABS
125.0000 ug | ORAL_TABLET | Freq: Every day | ORAL | 1 refills | Status: DC
  Filled 2021-08-09: qty 90, 90d supply, fill #0

## 2021-08-09 MED ORDER — OMEPRAZOLE 20 MG PO CPDR
20.0000 mg | DELAYED_RELEASE_CAPSULE | Freq: Every day | ORAL | 3 refills | Status: DC
  Filled 2021-08-09: qty 90, 90d supply, fill #0
  Filled 2021-11-29: qty 90, 90d supply, fill #1
  Filled 2022-02-20: qty 90, 90d supply, fill #2
  Filled 2022-05-23: qty 90, 90d supply, fill #3

## 2021-08-09 NOTE — Telephone Encounter (Signed)
90 day refills have been sent.

## 2021-08-25 ENCOUNTER — Encounter: Payer: Self-pay | Admitting: Medical-Surgical

## 2021-08-26 ENCOUNTER — Other Ambulatory Visit: Payer: Self-pay | Admitting: Medical-Surgical

## 2021-08-26 DIAGNOSIS — I10 Essential (primary) hypertension: Secondary | ICD-10-CM

## 2021-08-26 DIAGNOSIS — R7301 Impaired fasting glucose: Secondary | ICD-10-CM

## 2021-08-26 DIAGNOSIS — E7800 Pure hypercholesterolemia, unspecified: Secondary | ICD-10-CM

## 2021-08-26 DIAGNOSIS — E890 Postprocedural hypothyroidism: Secondary | ICD-10-CM

## 2021-08-30 ENCOUNTER — Other Ambulatory Visit (HOSPITAL_COMMUNITY): Payer: Self-pay

## 2021-08-30 ENCOUNTER — Ambulatory Visit (INDEPENDENT_AMBULATORY_CARE_PROVIDER_SITE_OTHER): Payer: No Typology Code available for payment source | Admitting: Medical-Surgical

## 2021-08-30 ENCOUNTER — Encounter: Payer: Self-pay | Admitting: Medical-Surgical

## 2021-08-30 ENCOUNTER — Other Ambulatory Visit: Payer: Self-pay

## 2021-08-30 VITALS — BP 182/100 | HR 100 | Resp 20 | Ht 67.0 in

## 2021-08-30 DIAGNOSIS — F419 Anxiety disorder, unspecified: Secondary | ICD-10-CM | POA: Diagnosis not present

## 2021-08-30 DIAGNOSIS — I10 Essential (primary) hypertension: Secondary | ICD-10-CM | POA: Diagnosis not present

## 2021-08-30 DIAGNOSIS — F4320 Adjustment disorder, unspecified: Secondary | ICD-10-CM

## 2021-08-30 DIAGNOSIS — F5104 Psychophysiologic insomnia: Secondary | ICD-10-CM

## 2021-08-30 DIAGNOSIS — R0681 Apnea, not elsewhere classified: Secondary | ICD-10-CM

## 2021-08-30 DIAGNOSIS — F4321 Adjustment disorder with depressed mood: Secondary | ICD-10-CM

## 2021-08-30 DIAGNOSIS — F331 Major depressive disorder, recurrent, moderate: Secondary | ICD-10-CM

## 2021-08-30 MED ORDER — LORAZEPAM 1 MG PO TABS
0.5000 mg | ORAL_TABLET | Freq: Two times a day (BID) | ORAL | 1 refills | Status: DC | PRN
Start: 2021-08-30 — End: 2022-02-14

## 2021-08-30 MED ORDER — METOPROLOL SUCCINATE ER 50 MG PO TB24
50.0000 mg | ORAL_TABLET | Freq: Every day | ORAL | 3 refills | Status: DC
  Filled 2021-08-30: qty 90, 90d supply, fill #0

## 2021-08-30 NOTE — Progress Notes (Signed)
HPI with pertinent ROS:   CC: Blood pressure, mood, sleep  HPI: Very pleasant 65 year old female presenting today for follow-up on:  Blood pressure-has been checking at home with elevated readings but reports they have not been quite as high as on arrival to her appointment today.  She is taking atenolol 25 mg daily as prescribed and has been on this medication for nearly 25 years.  Endorses daily episodes with chest tightness and shortness of breath but attributes these more to stress and anxiety rather than cardiac concerns.  No worsening of chest discomfort or shortness of breath with exertion.   Mood-has really been struggling with her mood lately.  She is taking Cymbalta 120 mg daily as prescribed, tolerating well without side effects.  Was previously taking lorazepam and has been trying to avoid use of this medication due to the risk of dependency and tolerance.  Unfortunately, she is still having significant issues since losing both her sister and her mother in the same year.  This is the first Christmas that she will be alone since her children are not nearby.  She is really having a hard time with the holidays.  She was previously seeing a counselor however this did not work out due to a poor fit.  She is toying with the idea of going back to counseling however it was not helpful in the past.  Would like to have a referral to psychiatry to discuss medication management and determine if she is on the right medication for her.  Denies SI/HI.  Insomnia-having significant difficulty with sleeping.  She is taking Penton 300-600 mg nightly as prescribed however this is not helping with sleep there have been a couple of times where she is taking 900 mg and it was also not helpful.  Notes that she was not able to get the sleep study that was previously ordered for her.  They did contact her and there was an appointment to go pick up the home sleep study kit however her schedule change abruptly and she  was unable to make it.  There was some effort to reschedule but this fell through the cracks and she has not heard back.  Notes that she did have some episodes of witnessed apnea and even though the idea of using a CPAP machine is not ideal, she notes that it would benefit her to help her sleep as well as her blood pressure.  I reviewed the past medical history, family history, social history, surgical history, and allergies today and no changes were needed.  Please see the problem list section below in epic for further details.   Physical exam:   General: Well Developed, well nourished, and in no acute distress.  Neuro: Alert and oriented x3.  HEENT: Normocephalic, atraumatic.  Skin: Warm and dry. Cardiac: Regular rate and rhythm, no murmurs rubs or gallops, no lower extremity edema.  Respiratory: Clear to auscultation bilaterally. Not using accessory muscles, speaking in full sentences.  Impression and Recommendations:    1. Essential hypertension Discontinue atenolol.  Switching to metoprolol succinate 50 mg once daily.  Check blood pressure at home with goal of 130/80 or less.  2. Witnessed apneic spells Reordering home sleep study.  This was unfortunately rescheduled when ordered before and somehow fell through the cracks.  Would like to get this done as she has witnessed apneic episodes which could be contributing to significant hypertension. - Home sleep test  3. Grief reaction 4. Anxiety 5. MDD (major depressive disorder),  recurrent episode, moderate (HCC) 6. Psychophysiological insomnia Referring to psychiatry per patient request.  Continue Cymbalta at 120 mg daily.  Discussed treatment of significant anxiety and difficulty sleeping at night due to racing thoughts.  We will go ahead and refill her lorazepam for very sparing use while waiting to get her in with psychiatry. - Ambulatory referral to Psychiatry  Return in about 2 weeks (around 09/13/2021) for nurse visit for BP  check. ___________________________________________ Clearnce Sorrel, DNP, APRN, FNP-BC [facility]

## 2021-09-02 ENCOUNTER — Other Ambulatory Visit: Payer: Self-pay | Admitting: Medical-Surgical

## 2021-09-02 ENCOUNTER — Encounter: Payer: Self-pay | Admitting: Medical-Surgical

## 2021-09-02 MED ORDER — HYDROCODONE-ACETAMINOPHEN 5-325 MG PO TABS: 1.0000 | ORAL_TABLET | Freq: Four times a day (QID) | ORAL | 0 refills | Status: DC | PRN

## 2021-09-07 ENCOUNTER — Other Ambulatory Visit: Payer: Self-pay | Admitting: Medical-Surgical

## 2021-09-13 ENCOUNTER — Encounter: Payer: Self-pay | Admitting: Medical-Surgical

## 2021-09-13 DIAGNOSIS — G4733 Obstructive sleep apnea (adult) (pediatric): Secondary | ICD-10-CM

## 2021-09-15 ENCOUNTER — Encounter: Payer: Self-pay | Admitting: Medical-Surgical

## 2021-09-15 ENCOUNTER — Ambulatory Visit (INDEPENDENT_AMBULATORY_CARE_PROVIDER_SITE_OTHER): Payer: 59 | Admitting: Medical-Surgical

## 2021-09-15 ENCOUNTER — Other Ambulatory Visit: Payer: Self-pay

## 2021-09-15 VITALS — BP 144/90 | HR 76 | Resp 20 | Ht 67.0 in | Wt 172.0 lb

## 2021-09-15 DIAGNOSIS — Z808 Family history of malignant neoplasm of other organs or systems: Secondary | ICD-10-CM

## 2021-09-15 DIAGNOSIS — Z0000 Encounter for general adult medical examination without abnormal findings: Secondary | ICD-10-CM | POA: Diagnosis not present

## 2021-09-15 DIAGNOSIS — Z1231 Encounter for screening mammogram for malignant neoplasm of breast: Secondary | ICD-10-CM | POA: Diagnosis not present

## 2021-09-15 NOTE — Progress Notes (Signed)
HPI: Destiny Graham is a 66 y.o. female who  has a past medical history of Anxiety, Arthritis, Breast nodule, Cataract, DDD (degenerative disc disease), cervical, Dysthymia, Generalized OA, GERD (gastroesophageal reflux disease), Hallux rigidus, Heart murmur, Hyperlipidemia, Hypertension, OSA on CPAP, Pseudophakia, Sleep apnea, Thyroid disease, and Uterine fibroid.  she presents to [facility] today, 09/15/21,  for chief complaint of: Annual physical exam  Dentist: 1 year ago, no concerns Eye exam: Overdue, reading glasses Exercise: none intentional Diet: Moderate carb, low fat, good protein Mammogram: Ordered today Colon cancer screening: Up-to-date COVID vaccine: Done, 2 boosters  Concerns: Would like a referral to dermatology for full skin survey due to family history of cancer.  Declined second shingles vaccine  Has a referral to psych in place and has the number to call if they do not reach out to her.  Past medical, surgical, social and family history reviewed:  Patient Active Problem List   Diagnosis Date Noted   Closed nondisplaced fracture of proximal phalanx of lesser toe of left foot 04/27/2021   Grief reaction 03/21/2021   Anxiety 03/21/2021   Psychophysiological insomnia 03/21/2021   Lung nodule seen on imaging study 07/28/2020   Parotid mass 05/14/2020   Elevated fasting glucose 05/21/2019   Stress fracture of right calcaneus 06/04/2017   Ingrown nail of great toe of right foot 06/04/2017   Pain from implanted hardware 06/04/2017   OSA on CPAP 10/10/2016   GERD (gastroesophageal reflux disease) 07/26/2016   DDD (degenerative disc disease), cervical 07/26/2016   Generalized OA 07/26/2016   Hypothyroidism 07/25/2016   Essential hypertension 07/25/2016   Hyperlipidemia 07/25/2016   MDD (major depressive disorder), recurrent episode, moderate ([place]) 07/25/2016    Past Surgical History:  Procedure Laterality Date    BUNIONECTOMY     CATARACT EXTRACTION  2016   EYE SURGERY     FRACTURE SURGERY N/A    [employer] 07/27/2020   fusion   2015   first MTP B   HARDWARE REMOVAL Left 06/15/2017   Procedure: Left Great Toe Hardware Removal;  Surgeon: Newt Minion, MD;  Location: [facility];  Service: Orthopedics;  Laterality: Left;   MYOMECTOMY  1992   ORIF FINGER FRACTURE  2006   PAROTIDECTOMY Right 05/14/2020   Procedure: RIGHT SUPERFICIAL PAROTIDECTOMY;  Surgeon: Jerrell Belfast, MD;  Location: [facility];  Service: ENT;  Laterality: Right;  REQUESTING RNFA   THYROIDECTOMY  2005   TOENAIL EXCISION Right 06/15/2017   Procedure: Right Great Toe Excision Lateral Border Nail;  Surgeon: Newt Minion, MD;  Location: [facility];  Service: Orthopedics;  Laterality: Right;   TONSILLECTOMY  1960    Social History   Tobacco Use   Smoking status: Never   Smokeless tobacco: Never  Substance Use Topics   Alcohol use: Yes    Alcohol/week: 6.0 - 7.0 standard drinks    Types: 6 - 7 Standard drinks or equivalent per week    Family History  Problem Relation Age of Onset   Arthritis Mother    Depression Mother    COPD Father    Hearing loss Father    Heart disease Father    Hyperlipidemia Father    Hypertension Father    Kidney disease Father    Alcohol abuse Father    Depression Sister    Alcohol abuse Sister    Heart disease Maternal Grandmother        CHF   COPD Maternal Grandfather    Alcohol abuse  Paternal Grandmother    Depression Sister    Heart disease Sister    Hyperlipidemia Sister    Depression Sister    Heart disease Sister    Hyperlipidemia Sister      Current medication list and allergy/intolerance information reviewed:    Current Outpatient Medications  Medication Sig Dispense Refill   acetaminophen (TYLENOL) 500 MG tablet Take 1,000 mg by mouth every 6 (six) hours as needed for mild pain or headache.      atenolol (TENORMIN) 50 MG tablet Take 50 mg by mouth daily.     DULoxetine (CYMBALTA) 60 MG  capsule TAKE 2 CAPSULES BY MOUTH ONCE A DAY 180 capsule 1   levothyroxine (SYNTHROID) 125 MCG tablet Take 1 tablet (125 mcg total) by mouth daily. 90 tablet 1   LORazepam (ATIVAN) 1 MG tablet Take 0.5-1 tablets (0.5-1 mg total) by mouth 2 (two) times daily as needed for anxiety. 30 tablet 1   omeprazole (PRILOSEC) 20 MG capsule Take 1 capsule (20 mg total) by mouth daily. 90 capsule 3   rosuvastatin (CRESTOR) 20 MG tablet TAKE 1 TABLET BY MOUTH ONCE A DAY 90 tablet 3   No current facility-administered medications for this visit.    Allergies  Allergen Reactions   Shingrix [Zoster Vac Recomb Adjuvanted] Other (See Comments)    Localized reaction with fever, hypotension, vertigo   Vioxx [Rofecoxib] Other (See Comments)    Photodermatitis    Sulfa Antibiotics Rash      Review of Systems: Constitutional:  No  fever, no chills, No recent illness, No unintentional weight changes. No significant fatigue.  HEENT: No  headache, no vision change, no hearing change, No sore throat, No  sinus pressure Cardiac: No  chest pain, No  pressure, No palpitations, No  Orthopnea Respiratory:  No  shortness of breath. No  Cough Gastrointestinal: No  abdominal pain, No  nausea, No  vomiting,  No  blood in stool, No  diarrhea, No  constipation  Musculoskeletal: No new myalgia/arthralgia Skin: No  Rash, No other wounds/concerning lesions Genitourinary: No  incontinence, No  abnormal genital bleeding, No abnormal genital discharge Hem/Onc: No  easy bruising/bleeding, No  abnormal lymph node Endocrine: No cold intolerance,  No heat intolerance. No polyuria/polydipsia/polyphagia  Neurologic: No  weakness, No  dizziness, No  slurred speech/focal weakness/facial droop Psychiatric: + Concerns with depression, + concerns with anxiety, + sleep problems, No mood problems  Exam:  BP (!) 144/90 (BP Location: Left Arm, Patient Position: Sitting, Cuff Size: Normal)    Pulse 76    Resp 20    Ht 5' 7"  (1.702 m)    Wt 172  lb (78 kg)    SpO2 99%    BMI 26.94 kg/m  Constitutional: VS see above. General Appearance: alert, well-developed, well-nourished, NAD Eyes: Normal lids and conjunctive, non-icteric sclera Ears, Nose, Mouth, Throat: MMM, Normal external inspection ears/nares/mouth/lips/gums. TM normal bilaterally.  Neck: No masses, trachea midline. No thyroid enlargement. No tenderness/mass appreciated. No lymphadenopathy Respiratory: Normal respiratory effort. no wheeze, no rhonchi, no rales Cardiovascular: S1/S2 normal, no murmur, no rub/gallop auscultated. RRR. No lower extremity edema. Pedal pulse II/IV bilaterally PT. No carotid bruit or JVD. No abdominal aortic bruit. Gastrointestinal: Nontender, no masses. No hepatomegaly, no splenomegaly. No hernia appreciated. Bowel sounds normal. Rectal exam deferred.  Musculoskeletal: Gait normal. No clubbing/cyanosis of digits.  Neurological: Normal balance/coordination. No tremor. No cranial nerve deficit on limited exam. Motor and sensation intact and symmetric. Cerebellar reflexes intact.  Skin: warm,  dry, intact. No rash/ulcer. No concerning nevi or subq nodules on limited exam.   Psychiatric: Normal judgment/insight. Normal mood and affect. Oriented x3.    ASSESSMENT/PLAN:   1. Annual physical exam Lab orders in.  Requisition reprinted for patient to have labs drawn today.  Recommend every 6 month dental visits for cleanings and yearly eye exams.  2. Family history of skin cancer Referring to dermatology per patient request. - Ambulatory referral to Dermatology  3. Encounter for screening mammogram for malignant neoplasm of breast Mammogram ordered today. - MM 3D SCREEN BREAST BILATERAL; Future   Orders Placed This Encounter  Procedures   MM 3D SCREEN BREAST BILATERAL   Ambulatory referral to Dermatology    No orders of the defined types were placed in this encounter.   Patient Instructions  Preventive Care 5 Years and Older,  Female Preventive care refers to lifestyle choices and visits with your health care provider that can promote health and wellness. Preventive care visits are also called wellness exams. What can I expect for my preventive care visit? Counseling Your health care provider may ask you questions about your: Medical history, including: Past medical problems. Family medical history. Pregnancy and menstrual history. History of falls. Current health, including: Memory and ability to understand (cognition). Emotional well-being. Home life and relationship well-being. Sexual activity and sexual health. Lifestyle, including: Alcohol, nicotine or tobacco, and drug use. Access to firearms. Diet, exercise, and sleep habits. Work and work Statistician. Sunscreen use. Safety issues such as seatbelt and bike helmet use. Physical exam Your health care provider will check your: Height and weight. These may be used to calculate your BMI (body mass index). BMI is a measurement that tells if you are at a healthy weight. Waist circumference. This measures the distance around your waistline. This measurement also tells if you are at a healthy weight and may help predict your risk of certain diseases, such as type 2 diabetes and high blood pressure. Heart rate and blood pressure. Body temperature. Skin for abnormal spots. What immunizations do I need? Vaccines are usually given at various ages, according to a schedule. Your health care provider will recommend vaccines for you based on your age, medical history, and lifestyle or other factors, such as travel or where you work. What tests do I need? Screening Your health care provider may recommend screening tests for certain conditions. This may include: Lipid and cholesterol levels. Hepatitis C test. Hepatitis B test. HIV (human immunodeficiency virus) test. STI (sexually transmitted infection) testing, if you are at risk. Lung cancer  screening. Colorectal cancer screening. Diabetes screening. This is done by checking your blood sugar (glucose) after you have not eaten for a while (fasting). Mammogram. Talk with your health care provider about how often you should have regular mammograms. BRCA-related cancer screening. This may be done if you have a family history of breast, ovarian, tubal, or peritoneal cancers. Bone density scan. This is done to screen for osteoporosis. Talk with your health care provider about your test results, treatment options, and if necessary, the need for more tests. Follow these instructions at home: Eating and drinking  Eat a diet that includes fresh fruits and vegetables, whole grains, lean protein, and low-fat dairy products. Limit your intake of foods with high amounts of sugar, saturated fats, and salt. Take vitamin and mineral supplements as recommended by your health care provider. Do not drink alcohol if your health care provider tells you not to drink. If you drink alcohol: Limit how  much you have to 0-1 drink a day. Know how much alcohol is in your drink. In the [place] one drink equals one 12 oz bottle of beer (355 mL), one 5 oz glass of wine (148 mL), or one 1 oz glass of hard liquor (44 mL). Lifestyle Brush your teeth every morning and night with fluoride toothpaste. Floss one time each day. Exercise for at least 30 minutes 5 or more days each week. Do not use any products that contain nicotine or tobacco. These products include cigarettes, chewing tobacco, and vaping devices, such as e-cigarettes. If you need help quitting, ask your health care provider. Do not use drugs. If you are sexually active, practice safe sex. Use a condom or other form of protection in order to prevent STIs. Take aspirin only as told by your health care provider. Make sure that you understand how much to take and what form to take. Work with your health care provider to find out whether it is safe and  beneficial for you to take aspirin daily. Ask your health care provider if you need to take a cholesterol-lowering medicine (statin). Find healthy ways to manage stress, such as: Meditation, yoga, or listening to music. Journaling. Talking to a trusted person. Spending time with friends and family. Minimize exposure to UV radiation to reduce your risk of skin cancer. Safety Always wear your seat belt while driving or riding in a vehicle. Do not drive: If you have been drinking alcohol. Do not ride with someone who has been drinking. When you are tired or distracted. While texting. If you have been using any mind-altering substances or drugs. Wear a helmet and other protective equipment during sports activities. If you have firearms in your house, make sure you follow all gun safety procedures. What's next? Visit your health care provider once a year for an annual wellness visit. Ask your health care provider how often you should have your eyes and teeth checked. Stay up to date on all vaccines. This information is not intended to replace advice given to you by your health care provider. Make sure you discuss any questions you have with your health care provider. Document Revised: 02/23/2021 Document Reviewed: 02/23/2021 Elsevier Patient Education  [place].   Follow-up plan: Return in about 6 months (around 03/15/2022) for chronic disease follow up.  Clearnce Sorrel, DNP, APRN, FNP-BC [facility]

## 2021-09-15 NOTE — Telephone Encounter (Signed)
Per Jenny Reichmann, sleep is ordered incorrectly.  Please reorder as I am unsure which type of study you want performed.  Charyl Bigger, CMA

## 2021-09-15 NOTE — Patient Instructions (Signed)
Preventive Care 65 Years and Older, Female °Preventive care refers to lifestyle choices and visits with your health care provider that can promote health and wellness. Preventive care visits are also called wellness exams. °What can I expect for my preventive care visit? °Counseling °Your health care provider may ask you questions about your: °Medical history, including: °Past medical problems. °Family medical history. °Pregnancy and menstrual history. °History of falls. °Current health, including: °Memory and ability to understand (cognition). °Emotional well-being. °Home life and relationship well-being. °Sexual activity and sexual health. °Lifestyle, including: °Alcohol, nicotine or tobacco, and drug use. °Access to firearms. °Diet, exercise, and sleep habits. °Work and work environment. °Sunscreen use. °Safety issues such as seatbelt and bike helmet use. °Physical exam °Your health care provider will check your: °Height and weight. These may be used to calculate your BMI (body mass index). BMI is a measurement that tells if you are at a healthy weight. °Waist circumference. This measures the distance around your waistline. This measurement also tells if you are at a healthy weight and may help predict your risk of certain diseases, such as type 2 diabetes and high blood pressure. °Heart rate and blood pressure. °Body temperature. °Skin for abnormal spots. °What immunizations do I need? °Vaccines are usually given at various ages, according to a schedule. Your health care provider will recommend vaccines for you based on your age, medical history, and lifestyle or other factors, such as travel or where you work. °What tests do I need? °Screening °Your health care provider may recommend screening tests for certain conditions. This may include: °Lipid and cholesterol levels. °Hepatitis C test. °Hepatitis B test. °HIV (human immunodeficiency virus) test. °STI (sexually transmitted infection) testing, if you are at  risk. °Lung cancer screening. °Colorectal cancer screening. °Diabetes screening. This is done by checking your blood sugar (glucose) after you have not eaten for a while (fasting). °Mammogram. Talk with your health care provider about how often you should have regular mammograms. °BRCA-related cancer screening. This may be done if you have a family history of breast, ovarian, tubal, or peritoneal cancers. °Bone density scan. This is done to screen for osteoporosis. °Talk with your health care provider about your test results, treatment options, and if necessary, the need for more tests. °Follow these instructions at home: °Eating and drinking ° °Eat a diet that includes fresh fruits and vegetables, whole grains, lean protein, and low-fat dairy products. Limit your intake of foods with high amounts of sugar, saturated fats, and salt. °Take vitamin and mineral supplements as recommended by your health care provider. °Do not drink alcohol if your health care provider tells you not to drink. °If you drink alcohol: °Limit how much you have to 0-1 drink a day. °Know how much alcohol is in your drink. In the [place] one drink equals one 12 oz bottle of beer (355 mL), one 5 oz glass of wine (148 mL), or one 1½ oz glass of hard liquor (44 mL). °Lifestyle °Brush your teeth every morning and night with fluoride toothpaste. Floss one time each day. °Exercise for at least 30 minutes 5 or more days each week. °Do not use any products that contain nicotine or tobacco. These products include cigarettes, chewing tobacco, and vaping devices, such as e-cigarettes. If you need help quitting, ask your health care provider. °Do not use drugs. °If you are sexually active, practice safe sex. Use a condom or other form of protection in order to prevent STIs. °Take aspirin only as told by your   health care provider. Make sure that you understand how much to take and what form to take. Work with your health care provider to find out whether it  is safe and beneficial for you to take aspirin daily. Ask your health care provider if you need to take a cholesterol-lowering medicine (statin). Find healthy ways to manage stress, such as: Meditation, yoga, or listening to music. Journaling. Talking to a trusted person. Spending time with friends and family. Minimize exposure to UV radiation to reduce your risk of skin cancer. Safety Always wear your seat belt while driving or riding in a vehicle. Do not drive: If you have been drinking alcohol. Do not ride with someone who has been drinking. When you are tired or distracted. While texting. If you have been using any mind-altering substances or drugs. Wear a helmet and other protective equipment during sports activities. If you have firearms in your house, make sure you follow all gun safety procedures. What's next? Visit your health care provider once a year for an annual wellness visit. Ask your health care provider how often you should have your eyes and teeth checked. Stay up to date on all vaccines. This information is not intended to replace advice given to you by your health care provider. Make sure you discuss any questions you have with your health care provider. Document Revised: 02/23/2021 Document Reviewed: 02/23/2021 Elsevier Patient Education  [place].

## 2021-09-16 ENCOUNTER — Other Ambulatory Visit (HOSPITAL_COMMUNITY): Payer: Self-pay

## 2021-09-16 ENCOUNTER — Encounter: Payer: Self-pay | Admitting: Medical-Surgical

## 2021-09-16 ENCOUNTER — Other Ambulatory Visit: Payer: Self-pay | Admitting: Medical-Surgical

## 2021-09-16 LAB — LIPID PANEL
Cholesterol: 212 mg/dL — ABNORMAL HIGH (ref <200)
HDL: 82 mg/dL (ref >=50)
LDL Cholesterol (Calc): 103 mg/dL (calc) — ABNORMAL HIGH
Non-HDL Cholesterol (Calc): 130 mg/dL (calc) — ABNORMAL HIGH (ref <130)
Total CHOL/HDL Ratio: 2.6 (calc) (ref <5.0)
Triglycerides: 157 mg/dL — ABNORMAL HIGH (ref <150)

## 2021-09-16 LAB — COMPLETE METABOLIC PANEL WITH GFR
AG Ratio: 2.3 (calc) (ref 1.0–2.5)
ALT: 15 U/L (ref 6–29)
AST: 23 U/L (ref 10–35)
Albumin: 4.4 g/dL (ref 3.6–5.1)
Alkaline phosphatase (APISO): 66 U/L (ref 37–153)
BUN: 18 mg/dL (ref 7–25)
CO2: 29 mmol/L (ref 20–32)
Calcium: 9.4 mg/dL (ref 8.6–10.4)
Chloride: 100 mmol/L (ref 98–110)
Creat: 0.95 mg/dL (ref 0.50–1.05)
Globulin: 1.9 g/dL (calc) (ref 1.9–3.7)
Glucose, Bld: 102 mg/dL — ABNORMAL HIGH (ref 65–99)
Potassium: 4.7 mmol/L (ref 3.5–5.3)
Sodium: 135 mmol/L (ref 135–146)
Total Bilirubin: 0.5 mg/dL (ref 0.2–1.2)
Total Protein: 6.3 g/dL (ref 6.1–8.1)
eGFR: 66 mL/min/{1.73_m2} (ref >=60)

## 2021-09-16 LAB — CBC WITH DIFFERENTIAL/PLATELET
Absolute Monocytes: 462 cells/uL (ref 200–950)
Basophils Absolute: 72 cells/uL (ref 0–200)
Basophils Relative: 1.3 %
Eosinophils Absolute: 171 cells/uL (ref 15–500)
Eosinophils Relative: 3.1 %
HCT: 40.7 % (ref 35.0–45.0)
Hemoglobin: 13.4 g/dL (ref 11.7–15.5)
Lymphs Abs: 1480 cells/uL (ref 850–3900)
MCH: 31.2 pg (ref 27.0–33.0)
MCHC: 32.9 g/dL (ref 32.0–36.0)
MCV: 94.9 fL (ref 80.0–100.0)
MPV: 10 fL (ref 7.5–12.5)
Monocytes Relative: 8.4 %
Neutro Abs: 3317 cells/uL (ref 1500–7800)
Neutrophils Relative %: 60.3 %
Platelets: 349 10*3/uL (ref 140–400)
RBC: 4.29 10*6/uL (ref 3.80–5.10)
RDW: 13.4 % (ref 11.0–15.0)
Total Lymphocyte: 26.9 %
WBC: 5.5 10*3/uL (ref 3.8–10.8)

## 2021-09-16 LAB — TSH: TSH: 7.1 mIU/L — ABNORMAL HIGH (ref 0.40–4.50)

## 2021-09-16 LAB — HEMOGLOBIN A1C
Hgb A1c MFr Bld: 5.8 % of total Hgb — ABNORMAL HIGH (ref <5.7)
Mean Plasma Glucose: 120 mg/dL
eAG (mmol/L): 6.6 mmol/L

## 2021-09-16 LAB — T3, FREE: T3, Free: 2.9 pg/mL (ref 2.3–4.2)

## 2021-09-16 LAB — T4, FREE: Free T4: 1.2 ng/dL (ref 0.8–1.8)

## 2021-09-16 MED ORDER — ATENOLOL 50 MG PO TABS
50.0000 mg | ORAL_TABLET | Freq: Every day | ORAL | 2 refills | Status: DC
  Filled 2021-09-16: qty 90, 90d supply, fill #0
  Filled 2021-12-17: qty 90, 90d supply, fill #1
  Filled 2022-03-12: qty 90, 90d supply, fill #2

## 2021-09-17 MED ORDER — LEVOTHYROXINE SODIUM 137 MCG PO TABS
137.0000 ug | ORAL_TABLET | Freq: Every day | ORAL | 1 refills | Status: DC
  Filled 2021-09-17: qty 90, 90d supply, fill #0
  Filled 2021-12-17: qty 90, 90d supply, fill #1

## 2021-09-17 NOTE — Addendum Note (Signed)
Addended bySamuel Bouche on: 09/17/2021 06:19 PM   Modules accepted: Orders

## 2021-09-19 ENCOUNTER — Other Ambulatory Visit (HOSPITAL_COMMUNITY): Payer: Self-pay

## 2021-10-31 ENCOUNTER — Encounter: Payer: Self-pay | Admitting: Pulmonary Disease

## 2021-11-29 ENCOUNTER — Other Ambulatory Visit (HOSPITAL_COMMUNITY): Payer: Self-pay

## 2021-12-17 ENCOUNTER — Other Ambulatory Visit (HOSPITAL_COMMUNITY): Payer: Self-pay

## 2022-02-01 ENCOUNTER — Other Ambulatory Visit: Payer: Self-pay

## 2022-02-01 ENCOUNTER — Emergency Department (HOSPITAL_BASED_OUTPATIENT_CLINIC_OR_DEPARTMENT_OTHER): Payer: 59

## 2022-02-01 ENCOUNTER — Emergency Department (HOSPITAL_COMMUNITY): Payer: 59 | Admitting: Certified Registered Nurse Anesthetist

## 2022-02-01 ENCOUNTER — Encounter: Payer: Self-pay | Admitting: Medical-Surgical

## 2022-02-01 ENCOUNTER — Inpatient Hospital Stay (HOSPITAL_BASED_OUTPATIENT_CLINIC_OR_DEPARTMENT_OTHER)
Admission: EM | Admit: 2022-02-01 | Discharge: 2022-02-04 | DRG: 340 | Disposition: A | Discharge status: Home / Self Care | Payer: 59 | Attending: Surgery | Admitting: Surgery

## 2022-02-01 ENCOUNTER — Emergency Department (EMERGENCY_DEPARTMENT_HOSPITAL): Payer: 59 | Admitting: Certified Registered Nurse Anesthetist

## 2022-02-01 ENCOUNTER — Encounter (HOSPITAL_BASED_OUTPATIENT_CLINIC_OR_DEPARTMENT_OTHER): Payer: Self-pay | Admitting: Emergency Medicine

## 2022-02-01 ENCOUNTER — Encounter (HOSPITAL_COMMUNITY): Admission: EM | Disposition: A | Discharge status: Home / Self Care | Payer: Self-pay | Source: Home / Self Care

## 2022-02-01 DIAGNOSIS — Z79890 Hormone replacement therapy (postmenopausal): Secondary | ICD-10-CM | POA: Diagnosis not present

## 2022-02-01 DIAGNOSIS — Z888 Allergy status to other drugs, medicaments and biological substances status: Secondary | ICD-10-CM

## 2022-02-01 DIAGNOSIS — Z8249 Family history of ischemic heart disease and other diseases of the circulatory system: Secondary | ICD-10-CM | POA: Diagnosis not present

## 2022-02-01 DIAGNOSIS — G4733 Obstructive sleep apnea (adult) (pediatric): Secondary | ICD-10-CM | POA: Diagnosis present

## 2022-02-01 DIAGNOSIS — K3532 Acute appendicitis with perforation and localized peritonitis, without abscess: Secondary | ICD-10-CM

## 2022-02-01 DIAGNOSIS — K219 Gastro-esophageal reflux disease without esophagitis: Secondary | ICD-10-CM | POA: Diagnosis present

## 2022-02-01 DIAGNOSIS — F341 Dysthymic disorder: Secondary | ICD-10-CM | POA: Diagnosis present

## 2022-02-01 DIAGNOSIS — Z83438 Family history of other disorder of lipoprotein metabolism and other lipidemia: Secondary | ICD-10-CM

## 2022-02-01 DIAGNOSIS — E039 Hypothyroidism, unspecified: Secondary | ICD-10-CM | POA: Diagnosis present

## 2022-02-01 DIAGNOSIS — I10 Essential (primary) hypertension: Secondary | ICD-10-CM

## 2022-02-01 DIAGNOSIS — Z79899 Other long term (current) drug therapy: Secondary | ICD-10-CM

## 2022-02-01 DIAGNOSIS — E876 Hypokalemia: Secondary | ICD-10-CM | POA: Diagnosis not present

## 2022-02-01 DIAGNOSIS — E785 Hyperlipidemia, unspecified: Secondary | ICD-10-CM | POA: Diagnosis present

## 2022-02-01 DIAGNOSIS — Z9989 Dependence on other enabling machines and devices: Secondary | ICD-10-CM

## 2022-02-01 DIAGNOSIS — Z882 Allergy status to sulfonamides status: Secondary | ICD-10-CM

## 2022-02-01 DIAGNOSIS — Z886 Allergy status to analgesic agent status: Secondary | ICD-10-CM

## 2022-02-01 HISTORY — PX: LAPAROSCOPIC APPENDECTOMY: SHX408

## 2022-02-01 LAB — CBC WITH DIFFERENTIAL/PLATELET
Abs Immature Granulocytes: 0.18 10*3/uL — ABNORMAL HIGH (ref 0.00–0.07)
Basophils Absolute: 0.1 10*3/uL (ref 0.0–0.1)
Basophils Relative: 0 %
Eosinophils Absolute: 0 10*3/uL (ref 0.0–0.5)
Eosinophils Relative: 0 %
HCT: 36.2 % (ref 36.0–46.0)
Hemoglobin: 11.9 g/dL — ABNORMAL LOW (ref 12.0–15.0)
Immature Granulocytes: 1 %
Lymphocytes Relative: 5 %
Lymphs Abs: 0.7 10*3/uL (ref 0.7–4.0)
MCH: 31 pg (ref 26.0–34.0)
MCHC: 32.9 g/dL (ref 30.0–36.0)
MCV: 94.3 fL (ref 80.0–100.0)
Monocytes Absolute: 1.1 10*3/uL — ABNORMAL HIGH (ref 0.1–1.0)
Monocytes Relative: 8 %
Neutro Abs: 12.4 10*3/uL — ABNORMAL HIGH (ref 1.7–7.7)
Neutrophils Relative %: 86 %
Platelets: 250 10*3/uL (ref 150–400)
RBC: 3.84 MIL/uL — ABNORMAL LOW (ref 3.87–5.11)
RDW: 14.1 % (ref 11.5–15.5)
WBC: 14.5 10*3/uL — ABNORMAL HIGH (ref 4.0–10.5)
nRBC: 0 % (ref 0.0–0.2)

## 2022-02-01 LAB — COMPREHENSIVE METABOLIC PANEL
ALT: 11 U/L (ref 0–44)
AST: 13 U/L — ABNORMAL LOW (ref 15–41)
Albumin: 3.7 g/dL (ref 3.5–5.0)
Alkaline Phosphatase: 64 U/L (ref 38–126)
Anion gap: 9 (ref 5–15)
BUN: 16 mg/dL (ref 8–23)
CO2: 25 mmol/L (ref 22–32)
Calcium: 8.8 mg/dL — ABNORMAL LOW (ref 8.9–10.3)
Chloride: 99 mmol/L (ref 98–111)
Creatinine, Ser: 0.98 mg/dL (ref 0.44–1.00)
GFR, Estimated: >60 mL/min (ref >60)
Glucose, Bld: 158 mg/dL — ABNORMAL HIGH (ref 70–99)
Potassium: 3.9 mmol/L (ref 3.5–5.1)
Sodium: 133 mmol/L — ABNORMAL LOW (ref 135–145)
Total Bilirubin: 0.7 mg/dL (ref 0.3–1.2)
Total Protein: 6.1 g/dL — ABNORMAL LOW (ref 6.5–8.1)

## 2022-02-01 LAB — URINALYSIS, ROUTINE W REFLEX MICROSCOPIC
Bilirubin Urine: NEGATIVE
Glucose, UA: NEGATIVE mg/dL
Hgb urine dipstick: NEGATIVE
Ketones, ur: NEGATIVE mg/dL
Leukocytes,Ua: NEGATIVE
Nitrite: NEGATIVE
Protein, ur: NEGATIVE mg/dL
Specific Gravity, Urine: 1.014 (ref 1.005–1.030)
pH: 6.5 (ref 5.0–8.0)

## 2022-02-01 LAB — LIPASE, BLOOD: Lipase: 11 U/L (ref 11–51)

## 2022-02-01 SURGERY — APPENDECTOMY, LAPAROSCOPIC
Anesthesia: General | Site: Abdomen

## 2022-02-01 MED ORDER — HYDRALAZINE HCL 20 MG/ML IJ SOLN: 10.0000 mg | INTRAMUSCULAR | Status: DC | PRN

## 2022-02-01 MED ORDER — SODIUM CHLORIDE 0.9 % IV SOLN: INTRAVENOUS | Status: DC

## 2022-02-01 MED ORDER — ORAL CARE MOUTH RINSE: 15.0000 mL | Freq: Once | OROMUCOSAL | Status: AC

## 2022-02-01 MED ORDER — PHENYLEPHRINE 80 MCG/ML (10ML) SYRINGE FOR IV PUSH (FOR BLOOD PRESSURE SUPPORT)
PREFILLED_SYRINGE | INTRAVENOUS | Status: DC | PRN
  Administered 2022-02-01: 160 ug via INTRAVENOUS

## 2022-02-01 MED ORDER — PIPERACILLIN-TAZOBACTAM 3.375 G IVPB
3.3750 g | Freq: Three times a day (TID) | INTRAVENOUS | Status: DC
  Administered 2022-02-01 – 2022-02-04 (×7): 3.375 g via INTRAVENOUS
  Filled 2022-02-01 (×7): qty 50

## 2022-02-01 MED ORDER — PROPOFOL 10 MG/ML IV BOLUS
INTRAVENOUS | Status: DC | PRN
  Administered 2022-02-01: 100 mg via INTRAVENOUS

## 2022-02-01 MED ORDER — HYDROMORPHONE HCL 1 MG/ML IJ SOLN
1.0000 mg | Freq: Once | INTRAMUSCULAR | Status: AC
  Administered 2022-02-01: 1 mg via INTRAVENOUS
  Filled 2022-02-01: qty 1

## 2022-02-01 MED ORDER — SIMETHICONE 80 MG PO CHEW: 40.0000 mg | CHEWABLE_TABLET | Freq: Four times a day (QID) | ORAL | Status: DC | PRN

## 2022-02-01 MED ORDER — LEVOTHYROXINE SODIUM 25 MCG PO TABS
137.0000 ug | ORAL_TABLET | Freq: Every day | ORAL | Status: DC
  Administered 2022-02-02 – 2022-02-04 (×3): 137 ug via ORAL
  Filled 2022-02-01 (×4): qty 1

## 2022-02-01 MED ORDER — IOHEXOL 300 MG/ML  SOLN
80.0000 mL | Freq: Once | INTRAMUSCULAR | Status: AC | PRN
  Administered 2022-02-01: 80 mL via INTRAVENOUS

## 2022-02-01 MED ORDER — ACETAMINOPHEN 10 MG/ML IV SOLN: 1000.0000 mg | Freq: Once | INTRAVENOUS | Status: DC | PRN

## 2022-02-01 MED ORDER — HYDROMORPHONE HCL 1 MG/ML IJ SOLN
0.5000 mg | INTRAMUSCULAR | Status: DC | PRN
  Administered 2022-02-01 – 2022-02-02 (×2): 0.5 mg via INTRAVENOUS
  Filled 2022-02-01 (×2): qty 0.5

## 2022-02-01 MED ORDER — MORPHINE SULFATE (PF) 4 MG/ML IV SOLN
4.0000 mg | Freq: Once | INTRAVENOUS | Status: DC
  Filled 2022-02-01: qty 1

## 2022-02-01 MED ORDER — FENTANYL CITRATE (PF) 250 MCG/5ML IJ SOLN
INTRAMUSCULAR | Status: DC | PRN
  Administered 2022-02-01: 50 ug via INTRAVENOUS
  Administered 2022-02-01: 100 ug via INTRAVENOUS

## 2022-02-01 MED ORDER — BISACODYL 10 MG RE SUPP: 10.0000 mg | Freq: Every day | RECTAL | Status: DC | PRN

## 2022-02-01 MED ORDER — DOCUSATE SODIUM 100 MG PO CAPS
100.0000 mg | ORAL_CAPSULE | Freq: Two times a day (BID) | ORAL | Status: DC
  Administered 2022-02-01 – 2022-02-02 (×3): 100 mg via ORAL
  Filled 2022-02-01 (×4): qty 1

## 2022-02-01 MED ORDER — PANTOPRAZOLE SODIUM 40 MG PO TBEC
40.0000 mg | DELAYED_RELEASE_TABLET | Freq: Every day | ORAL | Status: DC
Start: 2022-02-02 — End: 2022-02-04
  Administered 2022-02-02 – 2022-02-04 (×3): 40 mg via ORAL
  Filled 2022-02-01 (×3): qty 1

## 2022-02-01 MED ORDER — BUPIVACAINE-EPINEPHRINE 0.25% -1:200000 IJ SOLN
INTRAMUSCULAR | Status: DC | PRN
  Administered 2022-02-01: 8 mL

## 2022-02-01 MED ORDER — ONDANSETRON HCL 4 MG/2ML IJ SOLN
4.0000 mg | Freq: Four times a day (QID) | INTRAMUSCULAR | Status: DC | PRN
  Administered 2022-02-02: 4 mg via INTRAVENOUS
  Filled 2022-02-01: qty 2

## 2022-02-01 MED ORDER — MIDAZOLAM HCL 2 MG/2ML IJ SOLN
INTRAMUSCULAR | Status: DC | PRN
  Administered 2022-02-01: 2 mg via INTRAVENOUS

## 2022-02-01 MED ORDER — FENTANYL CITRATE (PF) 100 MCG/2ML IJ SOLN: 25.0000 ug | INTRAMUSCULAR | Status: DC | PRN

## 2022-02-01 MED ORDER — ONDANSETRON HCL 4 MG/2ML IJ SOLN
4.0000 mg | Freq: Once | INTRAMUSCULAR | Status: AC
  Administered 2022-02-01: 4 mg via INTRAVENOUS
  Filled 2022-02-01: qty 2

## 2022-02-01 MED ORDER — ACETAMINOPHEN 10 MG/ML IV SOLN
INTRAVENOUS | Status: AC
  Filled 2022-02-01: qty 100

## 2022-02-01 MED ORDER — CHLORHEXIDINE GLUCONATE 0.12 % MT SOLN
OROMUCOSAL | Status: AC
  Administered 2022-02-01: 15 mL via OROMUCOSAL
  Filled 2022-02-01: qty 15

## 2022-02-01 MED ORDER — TRAMADOL HCL 50 MG PO TABS
50.0000 mg | ORAL_TABLET | Freq: Four times a day (QID) | ORAL | Status: DC | PRN
  Administered 2022-02-01: 50 mg via ORAL
  Filled 2022-02-01: qty 1

## 2022-02-01 MED ORDER — ACETAMINOPHEN 10 MG/ML IV SOLN
INTRAVENOUS | Status: DC | PRN
  Administered 2022-02-01: 1000 mg via INTRAVENOUS

## 2022-02-01 MED ORDER — PIPERACILLIN-TAZOBACTAM 3.375 G IVPB 30 MIN
3.3750 g | INTRAVENOUS | Status: AC
  Administered 2022-02-01: 3.375 g via INTRAVENOUS
  Filled 2022-02-01: qty 50

## 2022-02-01 MED ORDER — 0.9 % SODIUM CHLORIDE (POUR BTL) OPTIME
TOPICAL | Status: DC | PRN
  Administered 2022-02-01: 1000 mL

## 2022-02-01 MED ORDER — ONDANSETRON 4 MG PO TBDP: 4.0000 mg | ORAL_TABLET | Freq: Four times a day (QID) | ORAL | Status: DC | PRN

## 2022-02-01 MED ORDER — CHLORHEXIDINE GLUCONATE 0.12 % MT SOLN: 15.0000 mL | Freq: Once | OROMUCOSAL | Status: AC

## 2022-02-01 MED ORDER — ONDANSETRON HCL 4 MG/2ML IJ SOLN: 4.0000 mg | Freq: Once | INTRAMUSCULAR | Status: DC | PRN

## 2022-02-01 MED ORDER — MORPHINE SULFATE (PF) 4 MG/ML IV SOLN
4.0000 mg | Freq: Once | INTRAVENOUS | Status: AC
  Administered 2022-02-01: 4 mg via INTRAVENOUS

## 2022-02-01 MED ORDER — AMISULPRIDE (ANTIEMETIC) 5 MG/2ML IV SOLN: 10.0000 mg | Freq: Once | INTRAVENOUS | Status: DC | PRN

## 2022-02-01 MED ORDER — DEXAMETHASONE SODIUM PHOSPHATE 10 MG/ML IJ SOLN
INTRAMUSCULAR | Status: DC | PRN
  Administered 2022-02-01: 10 mg via INTRAVENOUS

## 2022-02-01 MED ORDER — METHOCARBAMOL 500 MG PO TABS: 500.0000 mg | ORAL_TABLET | Freq: Four times a day (QID) | ORAL | Status: DC | PRN

## 2022-02-01 MED ORDER — DULOXETINE HCL 60 MG PO CPEP
120.0000 mg | ORAL_CAPSULE | Freq: Every day | ORAL | Status: DC
Start: 2022-02-02 — End: 2022-02-04
  Administered 2022-02-02 – 2022-02-04 (×3): 120 mg via ORAL
  Filled 2022-02-01 (×3): qty 2

## 2022-02-01 MED ORDER — SUGAMMADEX SODIUM 200 MG/2ML IV SOLN
INTRAVENOUS | Status: DC | PRN
  Administered 2022-02-01: 200 mg via INTRAVENOUS

## 2022-02-01 MED ORDER — ATENOLOL 25 MG PO TABS
50.0000 mg | ORAL_TABLET | Freq: Every day | ORAL | Status: DC
  Administered 2022-02-02 – 2022-02-04 (×3): 50 mg via ORAL
  Filled 2022-02-01 (×3): qty 2

## 2022-02-01 MED ORDER — ONDANSETRON HCL 4 MG/2ML IJ SOLN
INTRAMUSCULAR | Status: DC | PRN
  Administered 2022-02-01: 4 mg via INTRAVENOUS

## 2022-02-01 MED ORDER — LIDOCAINE 2% (20 MG/ML) 5 ML SYRINGE
INTRAMUSCULAR | Status: DC | PRN
  Administered 2022-02-01: 60 mg via INTRAVENOUS

## 2022-02-01 MED ORDER — BUPIVACAINE-EPINEPHRINE (PF) 0.25% -1:200000 IJ SOLN
INTRAMUSCULAR | Status: AC
  Filled 2022-02-01: qty 30

## 2022-02-01 MED ORDER — PHENYLEPHRINE HCL-NACL 20-0.9 MG/250ML-% IV SOLN
INTRAVENOUS | Status: DC | PRN
  Administered 2022-02-01: 40 ug/min via INTRAVENOUS

## 2022-02-01 MED ORDER — GABAPENTIN 300 MG PO CAPS
300.0000 mg | ORAL_CAPSULE | Freq: Two times a day (BID) | ORAL | Status: DC
  Administered 2022-02-01 – 2022-02-04 (×7): 300 mg via ORAL
  Filled 2022-02-01 (×7): qty 1

## 2022-02-01 MED ORDER — ENOXAPARIN SODIUM 40 MG/0.4ML IJ SOSY
40.0000 mg | PREFILLED_SYRINGE | INTRAMUSCULAR | Status: DC
  Administered 2022-02-03: 40 mg via SUBCUTANEOUS
  Filled 2022-02-01 (×2): qty 0.4

## 2022-02-01 MED ORDER — ACETAMINOPHEN 500 MG PO TABS
1000.0000 mg | ORAL_TABLET | Freq: Four times a day (QID) | ORAL | Status: DC
  Administered 2022-02-01 – 2022-02-04 (×9): 1000 mg via ORAL
  Filled 2022-02-01 (×9): qty 2

## 2022-02-01 MED ORDER — DIPHENHYDRAMINE HCL 12.5 MG/5ML PO ELIX: 12.5000 mg | ORAL_SOLUTION | Freq: Four times a day (QID) | ORAL | Status: DC | PRN

## 2022-02-01 MED ORDER — SUCCINYLCHOLINE CHLORIDE 200 MG/10ML IV SOSY
PREFILLED_SYRINGE | INTRAVENOUS | Status: DC | PRN
  Administered 2022-02-01: 80 mg via INTRAVENOUS

## 2022-02-01 MED ORDER — DIPHENHYDRAMINE HCL 50 MG/ML IJ SOLN: 12.5000 mg | Freq: Four times a day (QID) | INTRAMUSCULAR | Status: DC | PRN

## 2022-02-01 MED ORDER — KETOROLAC TROMETHAMINE 15 MG/ML IJ SOLN
15.0000 mg | Freq: Four times a day (QID) | INTRAMUSCULAR | Status: DC | PRN
  Administered 2022-02-02: 15 mg via INTRAVENOUS
  Filled 2022-02-01: qty 1

## 2022-02-01 MED ORDER — METOPROLOL TARTRATE 5 MG/5ML IV SOLN: 5.0000 mg | Freq: Four times a day (QID) | INTRAVENOUS | Status: DC | PRN

## 2022-02-01 MED ORDER — PIPERACILLIN-TAZOBACTAM 3.375 G IVPB 30 MIN
3.3750 g | Freq: Once | INTRAVENOUS | Status: AC
  Administered 2022-02-01: 3.375 g via INTRAVENOUS
  Filled 2022-02-01: qty 50

## 2022-02-01 MED ORDER — SODIUM CHLORIDE 0.9 % IV BOLUS
1000.0000 mL | Freq: Once | INTRAVENOUS | Status: AC
  Administered 2022-02-01: 1000 mL via INTRAVENOUS

## 2022-02-01 MED ORDER — ROCURONIUM BROMIDE 10 MG/ML (PF) SYRINGE
PREFILLED_SYRINGE | INTRAVENOUS | Status: DC | PRN
  Administered 2022-02-01: 50 mg via INTRAVENOUS

## 2022-02-01 MED ORDER — LACTATED RINGERS IV SOLN: INTRAVENOUS | Status: DC

## 2022-02-01 MED ORDER — LORAZEPAM 0.5 MG PO TABS: 0.5000 mg | ORAL_TABLET | Freq: Two times a day (BID) | ORAL | Status: DC | PRN

## 2022-02-01 MED ORDER — HYDROMORPHONE HCL 1 MG/ML IJ SOLN
0.5000 mg | Freq: Once | INTRAMUSCULAR | Status: AC
  Administered 2022-02-01: 0.5 mg via INTRAVENOUS
  Filled 2022-02-01: qty 1

## 2022-02-01 MED ORDER — OXYCODONE HCL 5 MG PO TABS
5.0000 mg | ORAL_TABLET | Freq: Four times a day (QID) | ORAL | Status: DC | PRN
  Administered 2022-02-03: 5 mg via ORAL
  Filled 2022-02-01: qty 1

## 2022-02-01 MED ORDER — SODIUM CHLORIDE 0.9 % IR SOLN
Status: DC | PRN
  Administered 2022-02-01 (×2): 1000 mL

## 2022-02-01 SURGICAL SUPPLY — 49 items
APPLIER CLIP 5 13 M/L LIGAMAX5 (MISCELLANEOUS)
BAG COUNTER SPONGE SURGICOUNT (BAG) ×2 IMPLANT
BAG RETRIEVAL 10 (BASKET) ×1
BIOPATCH RED 1 DISK 7.0 (GAUZE/BANDAGES/DRESSINGS) ×1 IMPLANT
BLADE CLIPPER SURG (BLADE) IMPLANT
CANISTER SUCT 3000ML PPV (MISCELLANEOUS) ×2 IMPLANT
CHLORAPREP W/TINT 26 (MISCELLANEOUS) ×2 IMPLANT
CLIP APPLIE 5 13 M/L LIGAMAX5 (MISCELLANEOUS) IMPLANT
COVER SURGICAL LIGHT HANDLE (MISCELLANEOUS) ×2 IMPLANT
CUTTER FLEX LINEAR 45M (STAPLE) ×2 IMPLANT
DERMABOND ADVANCED (GAUZE/BANDAGES/DRESSINGS) ×1
DERMABOND ADVANCED .7 DNX12 (GAUZE/BANDAGES/DRESSINGS) ×1 IMPLANT
DRSG TEGADERM 4X4.75 (GAUZE/BANDAGES/DRESSINGS) ×1 IMPLANT
ELECT REM PT RETURN 9FT ADLT (ELECTROSURGICAL) ×2
ELECTRODE REM PT RTRN 9FT ADLT (ELECTROSURGICAL) ×1 IMPLANT
EVACUATOR SILICONE 100CC (DRAIN) ×1 IMPLANT
GLOVE BIO SURGEON STRL SZ 6 (GLOVE) ×2 IMPLANT
GLOVE INDICATOR 6.5 STRL GRN (GLOVE) ×2 IMPLANT
GOWN STRL REUS W/ TWL LRG LVL3 (GOWN DISPOSABLE) ×3 IMPLANT
GOWN STRL REUS W/TWL LRG LVL3 (GOWN DISPOSABLE) ×3
GRASPER SUT TROCAR 14GX15 (MISCELLANEOUS) ×2 IMPLANT
KIT BASIN OR (CUSTOM PROCEDURE TRAY) ×2 IMPLANT
KIT TURNOVER KIT B (KITS) ×2 IMPLANT
NDL INSUFFLATION 14GA 120MM (NEEDLE) ×1 IMPLANT
NEEDLE INSUFFLATION 14GA 120MM (NEEDLE) ×2 IMPLANT
NS IRRIG 1000ML POUR BTL (IV SOLUTION) ×2 IMPLANT
PAD ARMBOARD 7.5X6 YLW CONV (MISCELLANEOUS) ×4 IMPLANT
RELOAD 45 VASCULAR/THIN (ENDOMECHANICALS) IMPLANT
RELOAD STAPLE 45 2.5 WHT GRN (ENDOMECHANICALS) IMPLANT
RELOAD STAPLE 45 3.5 BLU ETS (ENDOMECHANICALS) IMPLANT
RELOAD STAPLE TA45 3.5 REG BLU (ENDOMECHANICALS) ×2 IMPLANT
SCISSORS LAP 5X35 DISP (ENDOMECHANICALS) IMPLANT
SET IRRIG TUBING LAPAROSCOPIC (IRRIGATION / IRRIGATOR) ×2 IMPLANT
SET TUBE SMOKE EVAC HIGH FLOW (TUBING) ×2 IMPLANT
SHEARS 1100 HARMONIC 36 (ELECTROSURGICAL) ×1 IMPLANT
SHEARS HARMONIC ACE PLUS 36CM (ENDOMECHANICALS) ×2 IMPLANT
SLEEVE ENDOPATH XCEL 5M (ENDOMECHANICALS) ×2 IMPLANT
SPECIMEN JAR SMALL (MISCELLANEOUS) ×2 IMPLANT
SUT ETHILON 2 0 FS 18 (SUTURE) ×1 IMPLANT
SUT MNCRL AB 4-0 PS2 18 (SUTURE) ×2 IMPLANT
SYS BAG RETRIEVAL 10MM (BASKET) ×1
SYSTEM BAG RETRIEVAL 10MM (BASKET) IMPLANT
TOWEL GREEN STERILE FF (TOWEL DISPOSABLE) ×2 IMPLANT
TRAY FOLEY W/BAG SLVR 16FR (SET/KITS/TRAYS/PACK)
TRAY FOLEY W/BAG SLVR 16FR ST (SET/KITS/TRAYS/PACK) ×1 IMPLANT
TRAY LAPAROSCOPIC MC (CUSTOM PROCEDURE TRAY) ×2 IMPLANT
TROCAR XCEL 12X100 BLDLESS (ENDOMECHANICALS) ×2 IMPLANT
TROCAR XCEL NON-BLD 5MMX100MML (ENDOMECHANICALS) ×2 IMPLANT
WATER STERILE IRR 1000ML POUR (IV SOLUTION) ×1 IMPLANT

## 2022-02-01 NOTE — ED Notes (Signed)
Patient transported to CT via w/c. 

## 2022-02-01 NOTE — ED Provider Notes (Signed)
[facility] EMERGENCY DEPT Provider Note  CSN: [id number] Arrival date & time: 02/01/22 0400  Chief Complaint(s) Abdominal Pain  HPI Destiny Graham is a 66 y.o. female with a past medical history listed below who presents to the emergency department with 2 days of gradually worsening abdominal pain described as generalized but mostly lower.  Worse with movement and palpation.  She reports having 1 bout of diarrhea several days ago but has had normal soft bowel movements since.  She reports nausea with dry heaving.  No overt emesis.  No urinary symptoms.  Tonight patient had near syncopal episode upon standing she became lightheaded.    The history is provided by the patient.   Past Medical History Past Medical History:  Diagnosis Date   Anxiety    Phreesia 07/27/2020   Arthritis    Phreesia 07/27/2020   Breast nodule    Cataract    Phreesia 07/27/2020   DDD (degenerative disc disease), cervical    Dysthymia    Generalized OA    GERD (gastroesophageal reflux disease)    Hallux rigidus    Heart murmur    beneign   Hyperlipidemia    Hypertension    OSA on CPAP    Does not use a cpap   Pseudophakia    Sleep apnea    Phreesia 07/27/2020   Thyroid disease    Uterine fibroid    Patient Active Problem List   Diagnosis Date Noted   Closed nondisplaced fracture of proximal phalanx of lesser toe of left foot 04/27/2021   Grief reaction 03/21/2021   Anxiety 03/21/2021   Psychophysiological insomnia 03/21/2021   Lung nodule seen on imaging study 07/28/2020   Parotid mass 05/14/2020   Elevated fasting glucose 05/21/2019   Stress fracture of right calcaneus 06/04/2017   Ingrown nail of great toe of right foot 06/04/2017   Pain from implanted hardware 06/04/2017   OSA on CPAP 10/10/2016   GERD (gastroesophageal reflux disease) 07/26/2016   DDD (degenerative disc disease), cervical 07/26/2016   Generalized OA 07/26/2016   Hypothyroidism 07/25/2016   Essential  hypertension 07/25/2016   Hyperlipidemia 07/25/2016   MDD (major depressive disorder), recurrent episode, moderate ([place]) 07/25/2016   Home Medication(s) Prior to Admission medications   Medication Sig Start Date End Date Taking? Authorizing Provider  acetaminophen (TYLENOL) 500 MG tablet Take 1,000 mg by mouth every 6 (six) hours as needed for mild pain or headache.     [provider]  atenolol (TENORMIN) 50 MG tablet Take 1 tablet (50 mg total) by mouth daily. 09/16/21   Samuel Bouche, NP  DULoxetine (CYMBALTA) 60 MG capsule TAKE 2 CAPSULES BY MOUTH ONCE A DAY 08/09/21 08/09/22  Samuel Bouche, NP  levothyroxine (SYNTHROID) 137 MCG tablet Take 1 tablet by mouth daily. 09/17/21   Samuel Bouche, NP  LORazepam (ATIVAN) 1 MG tablet Take 0.5-1 tablets (0.5-1 mg total) by mouth 2 (two) times daily as needed for anxiety. 08/30/21 02/26/22  Samuel Bouche, NP  omeprazole (PRILOSEC) 20 MG capsule Take 1 capsule (20 mg total) by mouth daily. 08/09/21   Samuel Bouche, NP  rosuvastatin (CRESTOR) 20 MG tablet TAKE 1 TABLET BY MOUTH ONCE A DAY 08/09/21 08/09/22  Samuel Bouche, NP  Allergies Shingrix [zoster vac recomb adjuvanted], Vioxx [rofecoxib], and Sulfa antibiotics  Review of Systems Review of Systems As noted in HPI  Physical Exam Vital Signs  I have reviewed the triage vital signs BP (!) 129/52   Pulse 85   Temp 98.8 F (37.1 C) (Oral)   Resp 17   Ht '5\' 7"'$  (1.702 m)   Wt 80.2 kg   SpO2 97%   BMI 27.68 kg/m   Physical Exam Vitals reviewed.  Constitutional:      General: She is not in acute distress.    Appearance: She is well-developed. She is not diaphoretic.  HENT:     Head: Normocephalic and atraumatic.     Right Ear: External ear normal.     Left Ear: External ear normal.     Nose: Nose normal.  Eyes:     General: No scleral icterus.     Conjunctiva/sclera: Conjunctivae normal.  Neck:     Trachea: Phonation normal.  Cardiovascular:     Rate and Rhythm: Normal rate and regular rhythm.  Pulmonary:     Effort: Pulmonary effort is normal. No respiratory distress.     Breath sounds: No stridor.  Abdominal:     General: There is no distension.     Tenderness: There is abdominal tenderness in the right lower quadrant and suprapubic area. There is rebound. There is no guarding.     Hernia: No hernia is present.  Musculoskeletal:        General: Normal range of motion.     Cervical back: Normal range of motion.  Neurological:     Mental Status: She is alert and oriented to person, place, and time.  Psychiatric:        Behavior: Behavior normal.    ED Results and Treatments Labs (all labs ordered are listed, but only abnormal results are displayed) Labs Reviewed  COMPREHENSIVE METABOLIC PANEL - Abnormal; Notable for the following components:      Result Value   Sodium 133 (*)    Glucose, Bld 158 (*)    Calcium 8.8 (*)    Total Protein 6.1 (*)    AST 13 (*)    All other components within normal limits  CBC WITH DIFFERENTIAL/PLATELET - Abnormal; Notable for the following components:   WBC 14.5 (*)    RBC 3.84 (*)    Hemoglobin 11.9 (*)    Neutro Abs 12.4 (*)    Monocytes Absolute 1.1 (*)    Abs Immature Granulocytes 0.18 (*)    All other components within normal limits  URINALYSIS, ROUTINE W REFLEX MICROSCOPIC - Abnormal; Notable for the following components:   Color, Urine COLORLESS (*)    All other components within normal limits  LIPASE, BLOOD                                                                                                                         EKG  EKG Interpretation  Date/Time:    Ventricular  Rate:    PR Interval:    QRS Duration:   QT Interval:    QTC Calculation:   R Axis:     Text Interpretation:         Radiology No results found.  Pertinent labs & imaging results that  were available during my care of the patient were reviewed by me and considered in my medical decision making (see MDM for details).  Medications Ordered in ED Medications  sodium chloride 0.9 % bolus 1,000 mL (0 mLs Intravenous Stopped 02/01/22 0534)    And  0.9 %  sodium chloride infusion ( Intravenous New Bag/Given 02/01/22 0535)  HYDROmorphone (DILAUDID) injection 1 mg (has no administration in time range)  ondansetron (ZOFRAN) injection 4 mg (4 mg Intravenous Given 02/01/22 0443)  HYDROmorphone (DILAUDID) injection 0.5 mg (0.5 mg Intravenous Given 02/01/22 0443)  iohexol (OMNIPAQUE) 300 MG/ML solution 80 mL (80 mLs Intravenous Contrast Given 02/01/22 0609)  piperacillin-tazobactam (ZOSYN) IVPB 3.375 g (0 g Intravenous Stopped 02/01/22 0708)                                                                                                                                     Procedures .Critical Care Performed by: Fatima Blank, MD Authorized by: Fatima Blank, MD   Critical care provider statement:    Critical care time (minutes):  45   Critical care time was exclusive of:  Separately billable procedures and treating other patients   Critical care was necessary to treat or prevent imminent or life-threatening deterioration of the following conditions: perforation appendicitis.   Critical care was time spent personally by me on the following activities:  Development of treatment plan with patient or surrogate, discussions with consultants, evaluation of patient's response to treatment, examination of patient, obtaining history from patient or surrogate, review of old charts, re-evaluation of patient's condition, pulse oximetry, ordering and review of radiographic studies, ordering and review of laboratory studies and ordering and performing treatments and interventions   Care discussed with: admitting provider    (including critical care time)  Medical Decision Making / ED  Course    Complexity of Problem:  Patient's presenting problem/concern, DDX, and MDM listed below: Abdominal pain We will assess for any serious intra-abdominal inflammatory/infectious process including appendicitis.  Will assess for urinary tract infection. Low suspicion for bowel obstruction.  Hospitalization Considered:  Yes  Initial Intervention:  IV fluids and pain medicine    Complexity of Data:    Laboratory Tests ordered listed below with my independent interpretation: CBC with leukocytosis.  Mild anemia. Mild hyperglycemia without evidence of DKA.  No other significant electrolyte derangements or renal sufficiency.  No evidence of biliary obstruction or pancreatitis. UA pending   Imaging Studies ordered listed below with my independent interpretation: CT scan with evidence of appendicitis.  I do not see evidence of perforation.  Radiology read pending Radiology confirmed appendicitis but noted  small air collection at the tip of the appendix concerning for perforation.     ED Course:    Assessment, Add'l Intervention, and Reassessment: Abdominal pain Work-up revealed appendicitis with evidence of perforation Started on empiric antibiotics. Consulted surgery and spoke with Dr. Kae Heller, who accepted patient to [facility]. She will post for OR.    Final Clinical Impression(s) / ED Diagnoses Final diagnoses:  Appendicitis with perforation           This chart was dictated using voice recognition software.  Despite best efforts to proofread,  errors can occur which can change the documentation meaning.    Fatima Blank, MD 02/01/22 [phone]

## 2022-02-01 NOTE — ED Notes (Signed)
Late entry -- Pt returning from [facility] via w/c and has asked to use bathroom -- CT tech has provided pt with urine cup; this nurse assisting pt from hallway to bathroom - prior to entering bathroom pt states she would like daughter to be asked to step out before results are discussed - pt states she would like to be told results without family at bedside.  Will notify provider.

## 2022-02-01 NOTE — Anesthesia Postprocedure Evaluation (Signed)
Anesthesia Post Note  Patient: [name]  Procedure(s) Performed: APPENDECTOMY LAPAROSCOPIC (Abdomen)     Patient location during evaluation: PACU Anesthesia Type: General Level of consciousness: awake Pain management: pain level controlled Vital Signs Assessment: post-procedure vital signs reviewed and stable Respiratory status: spontaneous breathing, nonlabored ventilation, respiratory function stable and patient connected to nasal cannula oxygen Cardiovascular status: blood pressure returned to baseline and stable Postop Assessment: no apparent nausea or vomiting Anesthetic complications: no   No notable events documented.  Last Vitals:  Vitals:   02/01/22 1628 02/01/22 2030  BP: (!) 142/77 122/74  Pulse: 99 98  Resp: 17 16  Temp: 37.7 C 36.7 C  SpO2: 96% 94%    Last Pain:  Vitals:   02/01/22 2030  TempSrc: Oral  PainSc:                  Karyl Kinnier Armari Fussell

## 2022-02-01 NOTE — Anesthesia Procedure Notes (Signed)
Procedure Name: Intubation Date/Time: 02/01/2022 11:53 AM Performed by: Reece Agar, CRNA Pre-anesthesia Checklist: Patient identified, Emergency Drugs available, Suction available and Patient being monitored Patient Re-evaluated:Patient Re-evaluated prior to induction Oxygen Delivery Method: Circle System Utilized Preoxygenation: Pre-oxygenation with 100% oxygen Induction Type: IV induction, Rapid sequence and Cricoid Pressure applied Laryngoscope Size: Mac and 3 Grade View: Grade I Tube type: Oral Tube size: 7.0 mm Number of attempts: 1 Airway Equipment and Method: Stylet Placement Confirmation: ETT inserted through vocal cords under direct vision, positive ETCO2 and breath sounds checked- equal and bilateral Secured at: 22 cm Tube secured with: Tape Dental Injury: Teeth and Oropharynx as per pre-operative assessment

## 2022-02-01 NOTE — Transfer of Care (Signed)
Immediate Anesthesia Transfer of Care Note  Patient: Eller Sweis  Procedure(s) Performed: APPENDECTOMY LAPAROSCOPIC (Abdomen)  Patient Location: PACU  Anesthesia Type:General  Level of Consciousness: awake and alert   Airway & Oxygen Therapy: Patient Spontanous Breathing and Patient connected to face mask oxygen  Post-op Assessment: Report given to RN and Post -op Vital signs reviewed and stable  Post vital signs: Reviewed and stable  Last Vitals:  Vitals Value Taken Time  BP 150/70 02/01/22 1314  Temp    Pulse 109 02/01/22 1316  Resp 18 02/01/22 1317  SpO2 98 % 02/01/22 1316  Vitals shown include unvalidated device data.  Last Pain:  Vitals:   02/01/22 1108  TempSrc: Oral  PainSc: 10-Worst pain ever      Patients Stated Pain Goal: 2 (25/48/62 8241)  Complications: No notable events documented.

## 2022-02-01 NOTE — Op Note (Signed)
Operative Report  Destiny Graham 66 y.o. female  [id number]  [id number]  02/01/2022  Surgeon: Romana Juniper MD FACS   Assistant: Ewell Poe RNFA   Procedure performed: Laparoscopic Appendectomy   Preop diagnosis: acute perforated appendicitis   Post-op diagnosis/intraop findings: Gangrenous appendicitis with perforation and feculent peritonitis   Specimens: appendix  Retained items: 19 French round Blake drain   EBL: minimal   Complications: none   Description of procedure: After obtaining informed consent the patient was brought to the operating room. Antibiotics were administered. SCD's were applied. General endotracheal anesthesia was initiated and a formal time-out was performed.  Foley catheter was inserted which is removed at the end of the case.  The abdomen was prepped and draped in the usual sterile fashion and the abdomen was entered using an infraumbilical Veress needle and insufflated to 15 mmHg. A 5 mm trocar and camera were then introduced, the abdomen was inspected and there is no evidence of injury from our entry. A suprapubic 5 mm trocar and a left lower quadrant 12 mm trocar were introduced under direct visualization following infiltration with local. The patient was then placed in Trendelenburg and rotated to the left and the small bowel was reflected cephalad.  Purulent fluid was noted in the pelvis and frank stool was present in the right lower quadrant and along the right colic gutter.  The appendix was visualized: All but the most proximal 1 cm of the appendix was gangrenous and there was a perforation about 1 cm from the base of the appendix with frank spillage of stool and feculent peritonitis.  The ascending colon was medialized slightly by dividing the white line of Toldt until the cecum was fairly mobilized and the base of the appendix could be easily identified.  The mesoappendix was divided with the harmonic scalpel, skeletonizing the base which did appear  viable and of sufficient length to accommodate a stapler. Great care was taken to ensure no injury to surrounding retroperitoneal structures, cecum or terminal ileum.  A blue load 45 mm Endo GIA stapler was used to transect the appendix at its base from the cecum.  No residual appendiceal cuff is left. Hemostasis was ensured. The appendix was placed in an Endo Catch bag and removed through our 12 mm trocar site.  The abdomen was then reinspected and the staple line appears viable, intact and hemostatic.  The divided mesentery is completely hemostatic.  Feculent and purulent fluid was aspirated and then the right lower quadrant and pelvis were lavaged with warm sterile saline.  The effluent was clear.  A 19 French round Blake drain was inserted through the left lower quadrant 12 mm trocar site and this is directed down into the pelvis anterior to the rectum as well as up into the right colic gutter.  Omentum was brought down over the staple line between this and the drain.  The drain is secured to the skin with a 2-0 nylon. The abdomen was desufflated and all trocars removed. The skin incisions were closed with subcuticular 4-0 monocryl and Dermabond. The patient was awakened, extubated and transported to the recovery room in stable condition.    All counts were correct at the completion of the case.

## 2022-02-01 NOTE — ED Triage Notes (Signed)
Pt c/o N/V/D and abdominal pain since Monday night.

## 2022-02-01 NOTE — H&P (Signed)
H&P Note  Destiny Graham [birth year]  [id number].    Requesting MD: Fatima Blank, MD Chief Complaint/Reason for Consult: Appendicitis HPI:  Patient is a 66 year old female who presented to [facility] with 2 days of abdominal pain.  This began in the middle of the night Monday night with a sharp cramping sensation that was actually relieved with a fairly large volume episode of diarrhea.  She was able to work the next day but continued to have vague lower abdominal pain with small-volume soft but loose bowel movements.  Pain in lower abdomen and has gradually worsened since onset. Movement and palpation exacerbate pain. Associated nausea, dry heaves. No emesis and denies urinary symptoms or vaginal discharge. Became lightheaded upon standing earlier. PMH otherwise significant for HTN, HLD, OSA, GERD, and hypothyroidism. Prior abdominal surgery includes myomectomy in 1992, which was a laparoscopic surgery converted to a Pfannenstiel incision for bleeding. No blood thinners. 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  She reports in the last 40 minutes or so (while being transferred here from [place]) her pain has escalated significantly  ROS: Negative other than described in HPI  Family History  Problem Relation Age of Onset   Arthritis Mother    Depression Mother    COPD Father    Hearing loss Father    Heart disease Father    Hyperlipidemia Father    Hypertension Father    Kidney disease Father    Alcohol abuse Father    Depression Sister    Alcohol abuse Sister    Heart disease Maternal Grandmother        CHF   COPD Maternal Grandfather    Alcohol abuse Paternal Grandmother    Depression Sister    Heart disease Sister    Hyperlipidemia Sister    Depression Sister    Heart disease Sister    Hyperlipidemia Sister     Past Medical History:  Diagnosis Date   Anxiety    Phreesia 07/27/2020   Arthritis    Phreesia 07/27/2020   Breast nodule     Cataract    Phreesia 07/27/2020   DDD (degenerative disc disease), cervical    Dysthymia    Generalized OA    GERD (gastroesophageal reflux disease)    Hallux rigidus    Heart murmur    beneign   Hyperlipidemia    Hypertension    OSA on CPAP    Does not use a cpap   Pseudophakia    Sleep apnea    Phreesia 07/27/2020   Thyroid disease    Uterine fibroid     Past Surgical History:  Procedure Laterality Date   BUNIONECTOMY     CATARACT EXTRACTION  2016   EYE SURGERY     FRACTURE SURGERY N/A    Phreesia 07/27/2020   fusion   2015   first MTP B   HARDWARE REMOVAL Left 06/15/2017   Procedure: Left Great Toe Hardware Removal;  Surgeon: Newt Minion, MD;  Location: [place];  Service: Orthopedics;  Laterality: Left;   MYOMECTOMY  1992   ORIF FINGER FRACTURE  2006   PAROTIDECTOMY Right 05/14/2020   Procedure: RIGHT SUPERFICIAL PAROTIDECTOMY;  Surgeon: Jerrell Belfast, MD;  Location: [place];  Service: ENT;  Laterality: Right;  REQUESTING RNFA   THYROIDECTOMY  2005   TOENAIL EXCISION Right 06/15/2017   Procedure: Right Great Toe Excision Lateral Border Nail;  Surgeon: Newt Minion, MD;  Location: [facility]  OR;  Service: Orthopedics;  Laterality: Right;   TONSILLECTOMY  1960    Social History:  reports that she has never smoked. She has never used smokeless tobacco. She reports current alcohol use of about 6.0 - 7.0 standard drinks per week. She reports that she does not use drugs.  Allergies:  Allergies  Allergen Reactions   Shingrix [Zoster Vac Recomb Adjuvanted] Other (See Comments)    Localized reaction with fever, hypotension, vertigo   Vioxx [Rofecoxib] Other (See Comments)    Photodermatitis    Sulfa Antibiotics Rash    Medications Prior to Admission  Medication Sig Dispense Refill   acetaminophen (TYLENOL) 500 MG tablet Take 1,000 mg by mouth every 6 (six) hours as needed for mild pain or headache.      atenolol (TENORMIN) 50 MG tablet Take 1 tablet (50 mg total) by  mouth daily. 90 tablet 2   DULoxetine (CYMBALTA) 60 MG capsule TAKE 2 CAPSULES BY MOUTH ONCE A DAY 180 capsule 1   levothyroxine (SYNTHROID) 137 MCG tablet Take 1 tablet by mouth daily. 90 tablet 1   LORazepam (ATIVAN) 1 MG tablet Take 0.5-1 tablets (0.5-1 mg total) by mouth 2 (two) times daily as needed for anxiety. 30 tablet 1   omeprazole (PRILOSEC) 20 MG capsule Take 1 capsule (20 mg total) by mouth daily. 90 capsule 3   rosuvastatin (CRESTOR) 20 MG tablet TAKE 1 TABLET BY MOUTH ONCE A DAY 90 tablet 3     Blood pressure (!) 140/92, pulse 100, temperature 99.1 F (37.3 C), temperature source Oral, resp. rate 20, height '5\' 7"'$  (1.702 m), weight 80.2 kg, SpO2 100 %. Physical Exam:  General: pleasant, well-appearing but uncomfortable appearing woman HEENT: head is normocephalic, atraumatic.  Sclera are noninjected.  PERRL.  Ears and nose without any masses or lesions.  Mouth is pink and moist Heart: regular, rate, and rhythm.  Normal s1,s2. No obvious murmurs, gallops, or rubs noted.  Palpable radial and pedal pulses bilaterally Lungs: CTAB, no wheezes, rhonchi, or rales noted.  Respiratory effort nonlabored Abd: soft, diffusely tender with involuntary guarding and exquisite tenderness in the right lower quadrant MS: all 4 extremities are symmetrical with no cyanosis, clubbing, or edema. Skin: warm and dry with no masses, lesions, or rashes Neuro: Cranial nerves 2-12 grossly intact, sensation is normal throughout Psych: A&Ox3 with an appropriate affect.   Results for orders placed or performed during the hospital encounter of 02/01/22 (from the past 48 hour(s))  Comprehensive metabolic panel     Status: Abnormal   Collection Time: 02/01/22  4:26 AM  Result Value Ref Range   Sodium 133 (L) 135 - 145 mmol/L   Potassium 3.9 3.5 - 5.1 mmol/L   Chloride 99 98 - 111 mmol/L   CO2 25 22 - 32 mmol/L   Glucose, Bld 158 (H) 70 - 99 mg/dL    Comment: Glucose reference range applies only to samples  taken after fasting for at least 8 hours.   BUN 16 8 - 23 mg/dL   Creatinine, Ser 0.98 0.44 - 1.00 mg/dL   Calcium 8.8 (L) 8.9 - 10.3 mg/dL   Total Protein 6.1 (L) 6.5 - 8.1 g/dL   Albumin 3.7 3.5 - 5.0 g/dL   AST 13 (L) 15 - 41 U/L   ALT 11 0 - 44 U/L   Alkaline Phosphatase 64 38 - 126 U/L   Total Bilirubin 0.7 0.3 - 1.2 mg/dL   GFR, Estimated >60 >60 mL/min    Comment: (  NOTE) Calculated using the CKD-EPI Creatinine Equation (2021)    Anion gap 9 5 - 15    Comment: Performed at [facility], [place], [place] [address]  Lipase, blood     Status: None   Collection Time: 02/01/22  4:26 AM  Result Value Ref Range   Lipase 11 11 - 51 U/L    Comment: Performed at [facility], [address], [place], [place] [address]  CBC with Diff     Status: Abnormal   Collection Time: 02/01/22  4:26 AM  Result Value Ref Range   WBC 14.5 (H) 4.0 - 10.5 K/uL   RBC 3.84 (L) 3.87 - 5.11 MIL/uL   Hemoglobin 11.9 (L) 12.0 - 15.0 g/dL   HCT 36.2 36.0 - 46.0 %   MCV 94.3 80.0 - 100.0 fL   MCH 31.0 26.0 - 34.0 pg   MCHC 32.9 30.0 - 36.0 g/dL   RDW 14.1 11.5 - 15.5 %   Platelets 250 150 - 400 K/uL   nRBC 0.0 0.0 - 0.2 %   Neutrophils Relative % 86 %   Neutro Abs 12.4 (H) 1.7 - 7.7 K/uL   Lymphocytes Relative 5 %   Lymphs Abs 0.7 0.7 - 4.0 K/uL   Monocytes Relative 8 %   Monocytes Absolute 1.1 (H) 0.1 - 1.0 K/uL   Eosinophils Relative 0 %   Eosinophils Absolute 0.0 0.0 - 0.5 K/uL   Basophils Relative 0 %   Basophils Absolute 0.1 0.0 - 0.1 K/uL   Immature Granulocytes 1 %   Abs Immature Granulocytes 0.18 (H) 0.00 - 0.07 K/uL    Comment: Performed at [facility], [place], [place] [address]  Urinalysis, Routine w reflex microscopic Urine, Clean Catch     Status: Abnormal   Collection Time: 02/01/22  6:48 AM  Result Value Ref Range   Color, Urine COLORLESS (A) YELLOW   APPearance CLEAR CLEAR   Specific  Gravity, Urine 1.014 1.005 - 1.030   pH 6.5 5.0 - 8.0   Glucose, UA NEGATIVE NEGATIVE mg/dL   Hgb urine dipstick NEGATIVE NEGATIVE   Bilirubin Urine NEGATIVE NEGATIVE   Ketones, ur NEGATIVE NEGATIVE mg/dL   Protein, ur NEGATIVE NEGATIVE mg/dL   Nitrite NEGATIVE NEGATIVE   Leukocytes,Ua NEGATIVE NEGATIVE    Comment: Performed at [facility], [address] [place], [place] [address]   CT ABDOMEN PELVIS W CONTRAST  Result Date: 02/01/2022 CLINICAL DATA:  Right lower quadrant abdominal pain. Nausea vomiting and diarrhea. EXAM: CT ABDOMEN AND PELVIS WITH CONTRAST TECHNIQUE: Multidetector CT imaging of the abdomen and pelvis was performed using the standard protocol following bolus administration of intravenous contrast. RADIATION DOSE REDUCTION: This exam was performed according to the departmental dose-optimization program which includes automated exposure control, adjustment of the mA and/or kV according to patient size and/or use of iterative reconstruction technique. CONTRAST:  37m OMNIPAQUE IOHEXOL 300 MG/ML  SOLN COMPARISON:  No comparison studies available. FINDINGS: Lower chest: Unremarkable Hepatobiliary: No suspicious focal abnormality within the liver parenchyma. There is no evidence for gallstones, gallbladder wall thickening, or pericholecystic fluid. No intrahepatic or extrahepatic biliary dilation. Pancreas: No focal mass lesion. No dilatation of the main duct. No intraparenchymal cyst. No peripancreatic edema. Spleen: No splenomegaly. No focal mass lesion. Adrenals/Urinary Tract: No adrenal nodule or mass. Kidneys unremarkable. No evidence for hydroureter. The urinary bladder appears normal for the degree of distention. Stomach/Bowel: Stomach is unremarkable. No gastric wall thickening. No  evidence of outlet obstruction. Duodenum is normally positioned as is the ligament of Treitz. No small bowel wall thickening. No small bowel dilatation. The terminal ileum is normal.  Marked periappendiceal edema/inflammation and appendix is fluid-filled and markedly dilated up the 13 mm diameter. Wall of the appendix near the base appears discontinuous and there is a small 2.9 x 2.0 cm collection of gas and fluid in this region raising concern for perforation. Coronal imaging raises the question of a 8 mm appendicolith at the base of the appendix (coronal 56/5). No gross colonic mass. No colonic wall thickening. Vascular/Lymphatic: There is mild atherosclerotic calcification of the abdominal aorta without aneurysm. There is no gastrohepatic or hepatoduodenal ligament lymphadenopathy. No retroperitoneal or mesenteric lymphadenopathy. No pelvic sidewall lymphadenopathy. Reproductive: The uterus is unremarkable.  There is no adnexal mass. Other: Small volume free fluid noted in the pelvis. Musculoskeletal: No worrisome lytic or sclerotic osseous abnormality. IMPRESSION: 1. Findings consistent with acute appendicitis. Wall of the appendix near the base appears discontinuous and there is a small 2.9 x 2.0 cm collection of gas and fluid in this region raising concern for perforation. Coronal imaging suggests the presence 8 mm appendicolith at the base of the appendix. 2. Small volume free fluid in the pelvis. 3. Aortic Atherosclerosis (ICD10-I70.0). These findings were discussed with Dr. Leonette Monarch at the time of study interpretation. Electronically Signed   By: Misty Stanley M.D.   On: 02/01/2022 06:57      Assessment/Plan Acute appendicitis with likely perforation and abscess - CT with above and possible appendicolith at appendiceal base; symptoms have been ongoing for about 36 hours at this point but significantly worse over the course of the last hour - WBC 14.5, afebrile and hemodynamically stable  - to OR for laparoscopic appendectomy  - I have explained the procedure, risks, and aftercare of Laparoscopic Appendectomy.  Risks include but are not limited to anesthesia (MI, CVA, death),  bleeding, infection, wound problems, hernia, injury to surrounding structures (viscus, nerves, blood vessels, ureter), need for conversion to open procedure or ileocecectomy, post operative ileus or abscess, stump leak, stump appendicitis and increased risk of DVT/PE. She seems to understand and agrees to proceed with surgery. Keep NPO. Start IV abx. Admit to observation post-operative pending intra-operative findings.  We will proceed to the operating room as soon as possible.  FEN: NPO, IVF '@125cc'$ /h VTE: LMWH post-op ID: [email or web address]  HTN HLD OSA GERD Hypothyroidism  Clovis Riley, MD  [facility] 02/01/2022, 11:13 AM Please see Amion for pager number during day hours 7:00am-4:30pm

## 2022-02-01 NOTE — Anesthesia Preprocedure Evaluation (Addendum)
Anesthesia Evaluation  Patient identified by MRN, date of birth, ID band Patient awake    Reviewed: Allergy & Precautions, NPO status , Patient's Chart, lab work & pertinent test results  Airway Mallampati: II  TM Distance: >3 FB Neck ROM: Full    Dental no notable dental hx.    Pulmonary sleep apnea and Continuous Positive Airway Pressure Ventilation ,    Pulmonary exam normal        Cardiovascular hypertension, Pt. on home beta blockers Normal cardiovascular exam     Neuro/Psych PSYCHIATRIC DISORDERS Anxiety Depression negative neurological ROS     GI/Hepatic Neg liver ROS, GERD  Medicated and Controlled,  Endo/Other  Hypothyroidism   Renal/GU negative Renal ROS     Musculoskeletal  (+) Arthritis ,   Abdominal   Peds  Hematology negative hematology ROS (+)   Anesthesia Other Findings appendicitis  Reproductive/Obstetrics                            Anesthesia Physical Anesthesia Plan  ASA: 3  Anesthesia Plan: General   Post-op Pain Management:    Induction: Intravenous and Rapid sequence  PONV Risk Score and Plan: 4 or greater and Ondansetron, Dexamethasone, Midazolam and Treatment may vary due to age or medical condition  Airway Management Planned: Oral ETT  Additional Equipment:   Intra-op Plan:   Post-operative Plan: Extubation in OR  Informed Consent: I have reviewed the patients History and Physical, chart, labs and discussed the procedure including the risks, benefits and alternatives for the proposed anesthesia with the patient or authorized representative who has indicated his/her understanding and acceptance.     Dental advisory given  Plan Discussed with: CRNA  Anesthesia Plan Comments:        Anesthesia Quick Evaluation

## 2022-02-01 NOTE — ED Notes (Signed)
Pt requested family member not be present while reviewing labs and imaging results with EDP. This RN asked pt's daughter to wait in waiting room and can come back when pt is ready.

## 2022-02-02 ENCOUNTER — Encounter (HOSPITAL_COMMUNITY): Payer: Self-pay | Admitting: Surgery

## 2022-02-02 LAB — MAGNESIUM: Magnesium: 1.8 mg/dL (ref 1.7–2.4)

## 2022-02-02 LAB — CBC
HCT: 32.2 % — ABNORMAL LOW (ref 36.0–46.0)
Hemoglobin: 10.7 g/dL — ABNORMAL LOW (ref 12.0–15.0)
MCH: 31.9 pg (ref 26.0–34.0)
MCHC: 33.2 g/dL (ref 30.0–36.0)
MCV: 96.1 fL (ref 80.0–100.0)
Platelets: 213 10*3/uL (ref 150–400)
RBC: 3.35 MIL/uL — ABNORMAL LOW (ref 3.87–5.11)
RDW: 14.2 % (ref 11.5–15.5)
WBC: 7.3 10*3/uL (ref 4.0–10.5)
nRBC: 0 % (ref 0.0–0.2)

## 2022-02-02 LAB — BASIC METABOLIC PANEL
Anion gap: 7 (ref 5–15)
BUN: 9 mg/dL (ref 8–23)
CO2: 25 mmol/L (ref 22–32)
Calcium: 8.3 mg/dL — ABNORMAL LOW (ref 8.9–10.3)
Chloride: 104 mmol/L (ref 98–111)
Creatinine, Ser: 0.93 mg/dL (ref 0.44–1.00)
GFR, Estimated: >60 mL/min (ref >60)
Glucose, Bld: 165 mg/dL — ABNORMAL HIGH (ref 70–99)
Potassium: 3.8 mmol/L (ref 3.5–5.1)
Sodium: 136 mmol/L (ref 135–145)

## 2022-02-02 LAB — SURGICAL PATHOLOGY

## 2022-02-02 NOTE — Progress Notes (Signed)
Mobility Specialist Progress Note:  Pt seen ambulating in hallway with family member. No AD used, no assist needed.   Nelta Numbers Acute Rehab Secure Chat or Office Phone: [phone]

## 2022-02-02 NOTE — Discharge Instructions (Addendum)
[facility]  Please arrive at least 30 min before your appointment to complete your check in paperwork.  If you are unable to arrive 30 min prior to your appointment time we may have to cancel or reschedule you. LAPAROSCOPIC SURGERY: POST OP INSTRUCTIONS Always review your discharge instruction sheet given to you by the facility where your surgery was performed. IF YOU HAVE DISABILITY OR FAMILY LEAVE FORMS, YOU MUST BRING THEM TO THE OFFICE FOR PROCESSING.   DO NOT GIVE THEM TO YOUR DOCTOR.  PAIN CONTROL  First take acetaminophen (Tylenol) AND/or ibuprofen (Advil) to control your pain after surgery.  Follow directions on package.  Taking acetaminophen (Tylenol) and/or ibuprofen (Advil) regularly after surgery will help to control your pain and lower the amount of prescription pain medication you may need.  You should not take more than 4,000 mg (4 grams) of acetaminophen (Tylenol) in 24 hours.  You should not take ibuprofen (Advil), aleve, motrin, naprosyn or other NSAIDS if you have a history of stomach ulcers or chronic kidney disease.  A prescription for pain medication may be given to you upon discharge.  Take your pain medication as prescribed, if you still have uncontrolled pain after taking acetaminophen (Tylenol) or ibuprofen (Advil). Use ice packs to help control pain. If you need a refill on your pain medication, please contact your pharmacy.  They will contact our office to request authorization. Prescriptions will not be filled after 5pm or on week-ends.  HOME MEDICATIONS Take your usually prescribed medications unless otherwise directed.  DIET You should follow a light diet the first few days after arrival home.  Be sure to include lots of fluids daily. Avoid fatty, fried foods.   CONSTIPATION It is common to experience some constipation after surgery and if you are taking pain medication.  Increasing fluid intake and taking a stool softener (such as Colace)  will usually help or prevent this problem from occurring.  A mild laxative (Milk of Magnesia or Miralax) should be taken according to package instructions if there are no bowel movements after 48 hours.  WOUND/INCISION CARE Most patients will experience some swelling and bruising in the area of the incisions.  Ice packs will help.  Swelling and bruising can take several days to resolve.  Unless discharge instructions indicate otherwise, follow guidelines below  STERI-STRIPS - you may remove your outer bandages 48 hours after surgery, and you may shower at that time.  You have steri-strips (small skin tapes) in place directly over the incision.  These strips should be left on the skin for 7-10 days.   DERMABOND/SKIN GLUE - you may shower in 24 hours.  The glue will flake off over the next 2-3 weeks. Any sutures or staples will be removed at the office during your follow-up visit.  ACTIVITIES You may resume regular (light) daily activities beginning the next day--such as daily self-care, walking, climbing stairs--gradually increasing activities as tolerated.  You may have sexual intercourse when it is comfortable.  Refrain from any heavy lifting or straining until approved by your doctor. You may drive when you are no longer taking prescription pain medication, you can comfortably wear a seatbelt, and you can safely maneuver your car and apply brakes.  FOLLOW-UP You should see your doctor in the office for a follow-up appointment approximately 2-3 weeks after your surgery.  You should have been given your post-op/follow-up appointment when your surgery was scheduled.  If you did not receive a post-op/follow-up appointment, make sure   that you call for this appointment within a day or two after you arrive home to insure a convenient appointment time.  OTHER INSTRUCTIONS  WHEN TO CALL YOUR DOCTOR: Fever over 101.0 Inability to urinate Continued bleeding from incision. Increased pain, redness, or  drainage from the incision. Increasing abdominal pain  The clinic staff is available to answer your questions during regular business hours.  Please don't hesitate to call and ask to speak to one of the nurses for clinical concerns.  If you have a medical emergency, go to the nearest emergency room or call 911.  A surgeon from [facility] is always on call at the hospital. [address], [address], [place], [place]  [address] ? [address] [address], [place], [place]   [address] [phone] ? [phone] ? FAX [phone]   

## 2022-02-02 NOTE — Progress Notes (Signed)
1 Day Post-Op   Subjective/Chief Complaint: Much better, still some crampy pain mostly in rectum and tailbone area. Tolerating liquids, no nausea. May have had flatus once.    Objective: Vital signs in last 24 hours: Temp:  [98 F (36.7 C)-99.8 F (37.7 C)] 98.7 F (37.1 C) (05/25 0727) Pulse Rate:  [83-109] 83 (05/25 0727) Resp:  [14-25] 16 (05/25 0727) BP: (115-157)/(57-92) 121/66 (05/25 0727) SpO2:  [90 %-100 %] 98 % (05/25 0727) Last BM Date : 01/31/22  Intake/Output from previous day: 05/24 0701 - 05/25 0700 In: 3994.2 [P.O.:720; I.V.:3104.2; IV Piggyback:170] Out: 495 [Urine:200; Drains:275; Blood:20] Intake/Output this shift: No intake/output data recorded.  A&O x 3 Unlabored respirations Abdomen soft, nondistended, mildly tender RLQ. Incisions c/d/I no cellulitis or hematoma. Drain output purulent.   Lab Results:  Recent Labs    02/01/22 0426 02/02/22 0108  WBC 14.5* 7.3  HGB 11.9* 10.7*  HCT 36.2 32.2*  PLT 250 213   BMET Recent Labs    02/01/22 0426 02/02/22 0108  NA 133* 136  K 3.9 3.8  CL 99 104  CO2 25 25  GLUCOSE 158* 165*  BUN 16 9  CREATININE 0.98 0.93  CALCIUM 8.8* 8.3*   PT/INR No results for input(s): LABPROT, INR in the last 72 hours. ABG No results for input(s): PHART, HCO3 in the last 72 hours.  Invalid input(s): PCO2, PO2  Studies/Results: CT ABDOMEN PELVIS W CONTRAST  Result Date: 02/01/2022 CLINICAL DATA:  Right lower quadrant abdominal pain. Nausea vomiting and diarrhea. EXAM: CT ABDOMEN AND PELVIS WITH CONTRAST TECHNIQUE: Multidetector CT imaging of the abdomen and pelvis was performed using the standard protocol following bolus administration of intravenous contrast. RADIATION DOSE REDUCTION: This exam was performed according to the departmental dose-optimization program which includes automated exposure control, adjustment of the mA and/or kV according to patient size and/or use of iterative reconstruction technique.  CONTRAST:  17m OMNIPAQUE IOHEXOL 300 MG/ML  SOLN COMPARISON:  No comparison studies available. FINDINGS: Lower chest: Unremarkable Hepatobiliary: No suspicious focal abnormality within the liver parenchyma. There is no evidence for gallstones, gallbladder wall thickening, or pericholecystic fluid. No intrahepatic or extrahepatic biliary dilation. Pancreas: No focal mass lesion. No dilatation of the main duct. No intraparenchymal cyst. No peripancreatic edema. Spleen: No splenomegaly. No focal mass lesion. Adrenals/Urinary Tract: No adrenal nodule or mass. Kidneys unremarkable. No evidence for hydroureter. The urinary bladder appears normal for the degree of distention. Stomach/Bowel: Stomach is unremarkable. No gastric wall thickening. No evidence of outlet obstruction. Duodenum is normally positioned as is the ligament of Treitz. No small bowel wall thickening. No small bowel dilatation. The terminal ileum is normal. Marked periappendiceal edema/inflammation and appendix is fluid-filled and markedly dilated up the 13 mm diameter. Wall of the appendix near the base appears discontinuous and there is a small 2.9 x 2.0 cm collection of gas and fluid in this region raising concern for perforation. Coronal imaging raises the question of a 8 mm appendicolith at the base of the appendix (coronal 56/5). No gross colonic mass. No colonic wall thickening. Vascular/Lymphatic: There is mild atherosclerotic calcification of the abdominal aorta without aneurysm. There is no gastrohepatic or hepatoduodenal ligament lymphadenopathy. No retroperitoneal or mesenteric lymphadenopathy. No pelvic sidewall lymphadenopathy. Reproductive: The uterus is unremarkable.  There is no adnexal mass. Other: Small volume free fluid noted in the pelvis. Musculoskeletal: No worrisome lytic or sclerotic osseous abnormality. IMPRESSION: 1. Findings consistent with acute appendicitis. Wall of the appendix near the base appears discontinuous and  there  is a small 2.9 x 2.0 cm collection of gas and fluid in this region raising concern for perforation. Coronal imaging suggests the presence 8 mm appendicolith at the base of the appendix. 2. Small volume free fluid in the pelvis. 3. Aortic Atherosclerosis (ICD10-I70.0). These findings were discussed with Dr. Leonette Monarch at the time of study interpretation. Electronically Signed   By: Misty Stanley M.D.   On: 02/01/2022 06:57    Anti-infectives: Anti-infectives (From admission, onward)    Start     Dose/Rate Route Frequency Ordered Stop   02/01/22 2000  piperacillin-tazobactam (ZOSYN) IVPB 3.375 g        3.375 g 12.5 mL/hr over 240 Minutes Intravenous Every 8 hours 02/01/22 1442 02/06/22 2159   02/01/22 1200  piperacillin-tazobactam (ZOSYN) IVPB 3.375 g        3.375 g 100 mL/hr over 30 Minutes Intravenous STAT 02/01/22 1200 02/01/22 1235   02/01/22 0630  piperacillin-tazobactam (ZOSYN) IVPB 3.375 g        3.375 g 100 mL/hr over 30 Minutes Intravenous  Once 02/01/22 0622 02/01/22 0708       Assessment/Plan: s/p Procedure(s): APPENDECTOMY LAPAROSCOPIC (N/A)  Try advancing diet- high risk for ileus but doing well currently Mobilize Continue abx and drain  Will reassess in PM for possible discharge.    LOS: 1 day    Clovis Riley 02/02/2022

## 2022-02-03 LAB — CBC
HCT: 31.6 % — ABNORMAL LOW (ref 36.0–46.0)
Hemoglobin: 10.7 g/dL — ABNORMAL LOW (ref 12.0–15.0)
MCH: 31.8 pg (ref 26.0–34.0)
MCHC: 33.9 g/dL (ref 30.0–36.0)
MCV: 94 fL (ref 80.0–100.0)
Platelets: 241 10*3/uL (ref 150–400)
RBC: 3.36 MIL/uL — ABNORMAL LOW (ref 3.87–5.11)
RDW: 13.8 % (ref 11.5–15.5)
WBC: 10.1 10*3/uL (ref 4.0–10.5)
nRBC: 0 % (ref 0.0–0.2)

## 2022-02-03 LAB — BASIC METABOLIC PANEL
Anion gap: 8 (ref 5–15)
BUN: 17 mg/dL (ref 8–23)
CO2: 25 mmol/L (ref 22–32)
Calcium: 8.2 mg/dL — ABNORMAL LOW (ref 8.9–10.3)
Chloride: 99 mmol/L (ref 98–111)
Creatinine, Ser: 1.11 mg/dL — ABNORMAL HIGH (ref 0.44–1.00)
GFR, Estimated: 54 mL/min — ABNORMAL LOW (ref >60)
Glucose, Bld: 119 mg/dL — ABNORMAL HIGH (ref 70–99)
Potassium: 3.4 mmol/L — ABNORMAL LOW (ref 3.5–5.1)
Sodium: 132 mmol/L — ABNORMAL LOW (ref 135–145)

## 2022-02-03 MED ORDER — SACCHAROMYCES BOULARDII 250 MG PO CAPS
250.0000 mg | ORAL_CAPSULE | Freq: Two times a day (BID) | ORAL | Status: DC
  Administered 2022-02-03 – 2022-02-04 (×2): 250 mg via ORAL
  Filled 2022-02-03 (×3): qty 1

## 2022-02-03 MED ORDER — CALCIUM POLYCARBOPHIL 625 MG PO TABS
625.0000 mg | ORAL_TABLET | Freq: Every day | ORAL | Status: DC
  Administered 2022-02-03 – 2022-02-04 (×2): 625 mg via ORAL
  Filled 2022-02-03 (×2): qty 1

## 2022-02-03 MED ORDER — POTASSIUM CHLORIDE 20 MEQ PO PACK
40.0000 meq | PACK | Freq: Once | ORAL | Status: AC
  Administered 2022-02-03: 40 meq via ORAL
  Filled 2022-02-03: qty 2

## 2022-02-03 NOTE — Progress Notes (Signed)
2 Days Post-Op   Subjective/Chief Complaint: Cramping and voluminous diarrhea early this AM. Still somewhat uncomfortable, but abdominal pain improving and tolerating PO   Objective: Vital signs in last 24 hours: Temp:  [98.4 F (36.9 C)-99.1 F (37.3 C)] 98.4 F (36.9 C) (05/26 0459) Pulse Rate:  [76-82] 76 (05/26 0459) Resp:  [16-17] 17 (05/26 0459) BP: (144-164)/(73-86) 164/86 (05/26 0459) SpO2:  [96 %-99 %] 96 % (05/26 0459) Last BM Date : 02/02/22  Intake/Output from previous day: 05/25 0701 - 05/26 0700 In: -  Out: 75 [Drains:75] Intake/Output this shift: No intake/output data recorded.  A&O x 3 Unlabored respirations Abdomen soft, nondistended, mildly tender RLQ. Incisions c/d/I no cellulitis or hematoma. Drain output serous with some purulent clots  Lab Results:  Recent Labs    02/02/22 0108 02/03/22 0349  WBC 7.3 10.1  HGB 10.7* 10.7*  HCT 32.2* 31.6*  PLT 213 241    BMET Recent Labs    02/02/22 0108 02/03/22 0349  NA 136 132*  K 3.8 3.4*  CL 104 99  CO2 25 25  GLUCOSE 165* 119*  BUN 9 17  CREATININE 0.93 1.11*  CALCIUM 8.3* 8.2*    PT/INR No results for input(s): LABPROT, INR in the last 72 hours. ABG No results for input(s): PHART, HCO3 in the last 72 hours.  Invalid input(s): PCO2, PO2  Studies/Results: No results found.  Anti-infectives: Anti-infectives (From admission, onward)    Start     Dose/Rate Route Frequency Ordered Stop   02/01/22 2000  piperacillin-tazobactam (ZOSYN) IVPB 3.375 g        3.375 g 12.5 mL/hr over 240 Minutes Intravenous Every 8 hours 02/01/22 1442 02/06/22 2159   02/01/22 1200  piperacillin-tazobactam (ZOSYN) IVPB 3.375 g        3.375 g 100 mL/hr over 30 Minutes Intravenous STAT 02/01/22 1200 02/01/22 1235   02/01/22 0630  piperacillin-tazobactam (ZOSYN) IVPB 3.375 g        3.375 g 100 mL/hr over 30 Minutes Intravenous  Once 02/01/22 0622 02/01/22 0708       Assessment/Plan: s/p  Procedure(s): APPENDECTOMY LAPAROSCOPIC (N/A)  Add fiber supplement Replace potassium PO for hypokalemia 3.4 Cr up slightly this AM after stopping IVF and diarrhea- patient will try increasing h2o intake Mobilize Continue abx and drain  Will reassess in PM for possible discharge.    LOS: 2 days    Destiny Graham 02/03/2022

## 2022-02-04 LAB — BASIC METABOLIC PANEL
Anion gap: 8 (ref 5–15)
BUN: 17 mg/dL (ref 8–23)
CO2: 27 mmol/L (ref 22–32)
Calcium: 8.3 mg/dL — ABNORMAL LOW (ref 8.9–10.3)
Chloride: 97 mmol/L — ABNORMAL LOW (ref 98–111)
Creatinine, Ser: 1.31 mg/dL — ABNORMAL HIGH (ref 0.44–1.00)
GFR, Estimated: 45 mL/min — ABNORMAL LOW (ref >60)
Glucose, Bld: 102 mg/dL — ABNORMAL HIGH (ref 70–99)
Potassium: 3.9 mmol/L (ref 3.5–5.1)
Sodium: 132 mmol/L — ABNORMAL LOW (ref 135–145)

## 2022-02-04 MED ORDER — METRONIDAZOLE 500 MG PO TABS: 500.0000 mg | ORAL_TABLET | Freq: Three times a day (TID) | ORAL | 0 refills | Status: AC

## 2022-02-04 MED ORDER — TRAMADOL HCL 50 MG PO TABS: 50.0000 mg | ORAL_TABLET | Freq: Four times a day (QID) | ORAL | 0 refills | Status: DC | PRN

## 2022-02-04 MED ORDER — CIPROFLOXACIN HCL 500 MG PO TABS: 500.0000 mg | ORAL_TABLET | Freq: Two times a day (BID) | ORAL | 0 refills | Status: AC

## 2022-02-04 NOTE — Plan of Care (Signed)
Ready for discharge. Knowlegeable in taking care of JP drain

## 2022-02-04 NOTE — Progress Notes (Addendum)
3 Days Post-Op   Subjective/Chief Complaint: No issues this am, wants to go home   Objective: Vital signs in last 24 hours: Temp:  [98.3 F (36.8 C)-99.1 F (37.3 C)] 98.7 F (37.1 C) (05/27 0538) Pulse Rate:  [69-84] 77 (05/27 0538) Resp:  [16-18] 18 (05/27 0538) BP: (148-182)/(71-92) 148/86 (05/27 0538) SpO2:  [96 %-98 %] 97 % (05/27 0538) Last BM Date : 02/03/22  Intake/Output from previous day: 05/26 0701 - 05/27 0700 In: -  Out: 45 [Drains:45] Intake/Output this shift: No intake/output data recorded.  GI: soft approp tender drain serous  Lab Results:  Recent Labs    02/02/22 0108 02/03/22 0349  WBC 7.3 10.1  HGB 10.7* 10.7*  HCT 32.2* 31.6*  PLT 213 241   BMET Recent Labs    02/02/22 0108 02/03/22 0349  NA 136 132*  K 3.8 3.4*  CL 104 99  CO2 25 25  GLUCOSE 165* 119*  BUN 9 17  CREATININE 0.93 1.11*  CALCIUM 8.3* 8.2*   PT/INR No results for input(s): LABPROT, INR in the last 72 hours. ABG No results for input(s): PHART, HCO3 in the last 72 hours.  Invalid input(s): PCO2, PO2  Studies/Results: No results found.  Anti-infectives: Anti-infectives (From admission, onward)    Start     Dose/Rate Route Frequency Ordered Stop   02/01/22 2000  piperacillin-tazobactam (ZOSYN) IVPB 3.375 g        3.375 g 12.5 mL/hr over 240 Minutes Intravenous Every 8 hours 02/01/22 1442 02/07/22 0559   02/01/22 1200  piperacillin-tazobactam (ZOSYN) IVPB 3.375 g        3.375 g 100 mL/hr over 30 Minutes Intravenous STAT 02/01/22 1200 02/01/22 1235   02/01/22 0630  piperacillin-tazobactam (ZOSYN) IVPB 3.375 g        3.375 g 100 mL/hr over 30 Minutes Intravenous  Once 02/01/22 0622 02/01/22 0708       Assessment/Plan: POD 3 lap appy-Connor -check Cr if fine will dc today   Rolm Bookbinder 02/04/2022  Addendum: cr up to 1.3 from 1.1, otherwise fine, discussed with her and I think fine to dc home today, hydrate, she will get it rechecked early next week.  Will dc home

## 2022-02-07 ENCOUNTER — Encounter: Payer: Self-pay | Admitting: Medical-Surgical

## 2022-02-07 DIAGNOSIS — N289 Disorder of kidney and ureter, unspecified: Secondary | ICD-10-CM

## 2022-02-08 LAB — BASIC METABOLIC PANEL WITH GFR
BUN: 16 mg/dL (ref 7–25)
CO2: 27 mmol/L (ref 20–32)
Calcium: 9 mg/dL (ref 8.6–10.4)
Chloride: 97 mmol/L — ABNORMAL LOW (ref 98–110)
Creat: 1.04 mg/dL (ref 0.50–1.05)
Glucose, Bld: 113 mg/dL — ABNORMAL HIGH (ref 65–99)
Potassium: 4.4 mmol/L (ref 3.5–5.3)
Sodium: 134 mmol/L — ABNORMAL LOW (ref 135–146)
eGFR: 59 mL/min/{1.73_m2} — ABNORMAL LOW (ref >=60)

## 2022-02-08 NOTE — Discharge Summary (Signed)
[facility] Discharge Summary   Patient ID: Destiny Graham MRN: [id number] DOB/AGE: 1956-02-18 66 y.o.  Admit date: 02/01/2022 Discharge date: 02/08/2022   Discharge Diagnosis Perforated appendicitis   Consultants None  Imaging: No results found.  Procedures Dr. Kae Heller (02/01/2022) - Laparoscopic Appendectomy  Hospital Course:  Destiny Graham is a 66yo female who presented to [facility] with 2 days of abdominal pain. CT with acute appendicitis with likely perforation and abscess, and possible appendicolith at appendiceal base. Symptoms ongoing for about 36 hours but significantly worse over the course of the last hour. She was transferred to [facility] and taken to the operating room for laparoscopic appendectomy. Intraoperatively she was found to have Gangrenous appendicitis with perforation and feculent peritonitis. A JP drain was left. Tolerated procedure well and was transferred to the floor.  She did have a mild ileus postoperatively, but as this improved diet was advanced as tolerated. She was kept on IV antibiotics during hospitalization and sent home with 5 days of oral cipro/flagyl. On 02/04/22 the patient was felt stable for discharge home.  Patient will follow up as below and knows to call with questions or concerns.      Allergies as of 02/04/2022       Reactions   Shingrix [zoster Vac Recomb Adjuvanted] Other (See Comments)   Localized reaction with fever, hypotension, vertigo   Vioxx [rofecoxib] Other (See Comments)   Photodermatitis   Sulfa Antibiotics Rash        Medication List     TAKE these medications    acetaminophen 500 MG tablet Commonly known as: TYLENOL Take 1,000 mg by mouth every 6 (six) hours as needed for mild pain or headache.   atenolol 50 MG tablet Commonly known as: TENORMIN Take 1 tablet (50 mg total) by mouth daily.   ciprofloxacin 500 MG tablet Commonly known as: Cipro Take 1 tablet (500 mg total) by mouth 2 (two)  times daily for 5 days.   DULoxetine 60 MG capsule Commonly known as: CYMBALTA TAKE 2 CAPSULES BY MOUTH ONCE A DAY What changed: how much to take   levothyroxine 137 MCG tablet Commonly known as: SYNTHROID Take 1 tablet by mouth daily.   LORazepam 1 MG tablet Commonly known as: ATIVAN Take 0.5-1 tablets (0.5-1 mg total) by mouth 2 (two) times daily as needed for anxiety. What changed: how much to take   metroNIDAZOLE 500 MG tablet Commonly known as: Flagyl Take 1 tablet (500 mg total) by mouth 3 (three) times daily for 5 days.   naproxen sodium 220 MG tablet Commonly known as: ALEVE Take 440 mg by mouth 2 (two) times daily as needed (arthritis pain).   omeprazole 20 MG capsule Commonly known as: PRILOSEC Take 1 capsule (20 mg total) by mouth daily.   RETIN-A EX Apply 1 application. topically 2 (two) times a week.   rosuvastatin 20 MG tablet Commonly known as: CRESTOR TAKE 1 TABLET BY MOUTH ONCE A DAY What changed: how much to take   SYSTANE COMPLETE OP Place 1 drop into both eyes daily as needed (irritation).   traMADol 50 MG tablet Commonly known as: ULTRAM Take 1 tablet (50 mg total) by mouth every 6 (six) hours as needed for moderate pain.          Follow-up [facility], [place]. Go on 02/09/2022.   Specialty: General Surgery Why: Your appointment is 6/1 at 9am for drain check/ removal Please arrive 30 minutes prior to  your appointment to check in and fill out paperwork. Bring photo ID and insurance information. Contact information: [address] [place] [phone]        Clovis Riley, MD. Go on 03/08/2022.   Specialty: General Surgery Why: Your appointment is 6/28 at 4pm Please arrive 15 minutes early to check in. Contact information: [address] [place] [address] [phone]                  Signed: Wellington Hampshire, [facility] 02/08/2022, 1:52 PM Please see Amion for pager number during day hours 7:00am-4:30pm

## 2022-02-14 ENCOUNTER — Other Ambulatory Visit (HOSPITAL_COMMUNITY): Payer: Self-pay

## 2022-02-14 ENCOUNTER — Encounter: Payer: Self-pay | Admitting: Medical-Surgical

## 2022-02-14 MED ORDER — LORAZEPAM 1 MG PO TABS
1.0000 mg | ORAL_TABLET | Freq: Two times a day (BID) | ORAL | 0 refills | Status: DC | PRN
  Filled 2022-02-14: qty 30, 15d supply, fill #0

## 2022-02-14 MED ORDER — FLUCONAZOLE 150 MG PO TABS
150.0000 mg | ORAL_TABLET | Freq: Once | ORAL | 1 refills | Status: AC
  Filled 2022-02-14: qty 2, 3d supply, fill #0

## 2022-02-20 ENCOUNTER — Other Ambulatory Visit (HOSPITAL_COMMUNITY): Payer: Self-pay

## 2022-02-20 ENCOUNTER — Other Ambulatory Visit: Payer: Self-pay | Admitting: Medical-Surgical

## 2022-02-23 ENCOUNTER — Other Ambulatory Visit (HOSPITAL_COMMUNITY): Payer: Self-pay

## 2022-02-23 MED ORDER — DULOXETINE HCL 60 MG PO CPEP
ORAL_CAPSULE | Freq: Every day | ORAL | 1 refills | Status: DC
  Filled 2022-02-23: qty 180, 90d supply, fill #0
  Filled 2022-05-23: qty 180, 90d supply, fill #1

## 2022-02-24 ENCOUNTER — Other Ambulatory Visit (HOSPITAL_COMMUNITY): Payer: Self-pay

## 2022-03-12 ENCOUNTER — Other Ambulatory Visit: Payer: Self-pay | Admitting: Medical-Surgical

## 2022-03-13 ENCOUNTER — Other Ambulatory Visit (HOSPITAL_COMMUNITY): Payer: Self-pay

## 2022-03-15 ENCOUNTER — Other Ambulatory Visit (HOSPITAL_COMMUNITY): Payer: Self-pay

## 2022-03-15 MED ORDER — NYSTATIN 100000 UNIT/GM EX POWD
CUTANEOUS | 3 refills | Status: DC
  Filled 2022-03-15: qty 60, 60d supply, fill #0
  Filled 2022-05-23: qty 60, 30d supply, fill #1

## 2022-03-15 MED ORDER — LEVOTHYROXINE SODIUM 137 MCG PO TABS
137.0000 ug | ORAL_TABLET | Freq: Every day | ORAL | 0 refills | Status: DC
  Filled 2022-03-15: qty 30, 30d supply, fill #0

## 2022-03-16 ENCOUNTER — Other Ambulatory Visit (HOSPITAL_COMMUNITY): Payer: Self-pay

## 2022-03-19 ENCOUNTER — Encounter: Payer: Self-pay | Admitting: Medical-Surgical

## 2022-03-19 DIAGNOSIS — E7800 Pure hypercholesterolemia, unspecified: Secondary | ICD-10-CM

## 2022-03-19 DIAGNOSIS — I10 Essential (primary) hypertension: Secondary | ICD-10-CM

## 2022-03-19 DIAGNOSIS — K6289 Other specified diseases of anus and rectum: Secondary | ICD-10-CM

## 2022-03-19 DIAGNOSIS — E890 Postprocedural hypothyroidism: Secondary | ICD-10-CM

## 2022-03-19 DIAGNOSIS — Z9049 Acquired absence of other specified parts of digestive tract: Secondary | ICD-10-CM

## 2022-03-20 NOTE — Telephone Encounter (Signed)
Requested labs pended.

## 2022-03-24 ENCOUNTER — Encounter: Payer: Self-pay | Admitting: Medical-Surgical

## 2022-03-24 LAB — COMPLETE METABOLIC PANEL WITH GFR
AG Ratio: 2 (calc) (ref 1.0–2.5)
ALT: 23 U/L (ref 6–29)
AST: 34 U/L (ref 10–35)
Albumin: 4.5 g/dL (ref 3.6–5.1)
Alkaline phosphatase (APISO): 69 U/L (ref 37–153)
BUN: 17 mg/dL (ref 7–25)
CO2: 27 mmol/L (ref 20–32)
Calcium: 9.4 mg/dL (ref 8.6–10.4)
Chloride: 94 mmol/L — ABNORMAL LOW (ref 98–110)
Creat: 0.99 mg/dL (ref 0.50–1.05)
Globulin: 2.3 g/dL (calc) (ref 1.9–3.7)
Glucose, Bld: 80 mg/dL (ref 65–99)
Potassium: 4.8 mmol/L (ref 3.5–5.3)
Sodium: 132 mmol/L — ABNORMAL LOW (ref 135–146)
Total Bilirubin: 0.7 mg/dL (ref 0.2–1.2)
Total Protein: 6.8 g/dL (ref 6.1–8.1)
eGFR: 62 mL/min/{1.73_m2} (ref >=60)

## 2022-03-24 LAB — CBC WITH DIFFERENTIAL/PLATELET
Absolute Monocytes: 743 cells/uL (ref 200–950)
Basophils Absolute: 71 cells/uL (ref 0–200)
Basophils Relative: 0.9 %
Eosinophils Absolute: 190 cells/uL (ref 15–500)
Eosinophils Relative: 2.4 %
HCT: 41.3 % (ref 35.0–45.0)
Hemoglobin: 13.8 g/dL (ref 11.7–15.5)
Lymphs Abs: 2568 cells/uL (ref 850–3900)
MCH: 31.2 pg (ref 27.0–33.0)
MCHC: 33.4 g/dL (ref 32.0–36.0)
MCV: 93.4 fL (ref 80.0–100.0)
MPV: 9.9 fL (ref 7.5–12.5)
Monocytes Relative: 9.4 %
Neutro Abs: 4329 cells/uL (ref 1500–7800)
Neutrophils Relative %: 54.8 %
Platelets: 361 10*3/uL (ref 140–400)
RBC: 4.42 10*6/uL (ref 3.80–5.10)
RDW: 13.6 % (ref 11.0–15.0)
Total Lymphocyte: 32.5 %
WBC: 7.9 10*3/uL (ref 3.8–10.8)

## 2022-03-24 LAB — LIPID PANEL
Cholesterol: 254 mg/dL — ABNORMAL HIGH (ref <200)
HDL: 107 mg/dL (ref >=50)
LDL Cholesterol (Calc): 122 mg/dL (calc) — ABNORMAL HIGH
Non-HDL Cholesterol (Calc): 147 mg/dL (calc) — ABNORMAL HIGH (ref <130)
Total CHOL/HDL Ratio: 2.4 (calc) (ref <5.0)
Triglycerides: 136 mg/dL (ref <150)

## 2022-03-24 LAB — C-REACTIVE PROTEIN: CRP: 7.4 mg/L (ref <8.0)

## 2022-03-24 LAB — TSH: TSH: 11.73 mIU/L — ABNORMAL HIGH (ref 0.40–4.50)

## 2022-05-02 ENCOUNTER — Other Ambulatory Visit: Payer: Self-pay | Admitting: Medical-Surgical

## 2022-05-04 MED ORDER — LEVOTHYROXINE SODIUM 137 MCG PO TABS
137.0000 ug | ORAL_TABLET | Freq: Every day | ORAL | 0 refills | Status: DC
  Filled 2022-05-04: qty 30, 30d supply, fill #0

## 2022-05-05 ENCOUNTER — Other Ambulatory Visit (HOSPITAL_COMMUNITY): Payer: Self-pay

## 2022-05-23 ENCOUNTER — Other Ambulatory Visit (HOSPITAL_COMMUNITY): Payer: Self-pay

## 2022-05-28 ENCOUNTER — Other Ambulatory Visit: Payer: Self-pay | Admitting: Medical-Surgical

## 2022-05-29 ENCOUNTER — Other Ambulatory Visit (HOSPITAL_COMMUNITY): Payer: Self-pay

## 2022-05-29 MED ORDER — LEVOTHYROXINE SODIUM 137 MCG PO TABS
137.0000 ug | ORAL_TABLET | Freq: Every day | ORAL | 0 refills | Status: DC
  Filled 2022-05-29: qty 30, 30d supply, fill #0

## 2022-05-29 NOTE — Telephone Encounter (Signed)
Last office visit 09/15/2021  Upcoming appointment 06/20/2022  Sent 30 days supply to the pharmacy. No refills.

## 2022-05-30 ENCOUNTER — Other Ambulatory Visit (HOSPITAL_COMMUNITY): Payer: Self-pay

## 2022-06-10 ENCOUNTER — Other Ambulatory Visit: Payer: Self-pay | Admitting: Medical-Surgical

## 2022-06-12 ENCOUNTER — Other Ambulatory Visit (HOSPITAL_COMMUNITY): Payer: Self-pay

## 2022-06-12 MED ORDER — ATENOLOL 50 MG PO TABS
50.0000 mg | ORAL_TABLET | Freq: Every day | ORAL | 2 refills | Status: DC
  Filled 2022-06-12: qty 90, 90d supply, fill #0
  Filled 2022-09-08: qty 90, 90d supply, fill #1
  Filled 2022-12-07 – 2022-12-17 (×2): qty 90, 90d supply, fill #2

## 2022-06-19 ENCOUNTER — Ambulatory Visit: Payer: 59 | Admitting: Medical-Surgical

## 2022-06-19 NOTE — Progress Notes (Unsigned)
   Established Patient Office Visit  Subjective   Patient ID: Destiny Graham, female   DOB: December 29, 1955 Age: 66 y.o. MRN: [id number]   No chief complaint on file.   HPI Pleasant 66 year old female presenting today for follow-up on:  Hypothyroidism:  Mood:  Hypertension:   Objective:    There were no vitals filed for this visit.  Physical Exam   No results found for this or any previous visit (from the past 24 hour(s)).   {Labs (Optional):23779}  The ASCVD Risk score (Arnett DK, et al., 2019) failed to calculate for the following reasons:   The valid HDL cholesterol range is 20 to 100 mg/dL   Assessment & Plan:   No problem-specific Assessment & Plan notes found for this encounter.   No follow-ups on file.  ___________________________________________ Clearnce Sorrel, DNP, APRN, FNP-BC [facility]

## 2022-06-20 ENCOUNTER — Ambulatory Visit: Payer: 59 | Admitting: Medical-Surgical

## 2022-06-20 ENCOUNTER — Encounter: Payer: Self-pay | Admitting: Medical-Surgical

## 2022-06-20 ENCOUNTER — Other Ambulatory Visit (HOSPITAL_COMMUNITY): Payer: Self-pay

## 2022-06-20 VITALS — BP 146/88 | HR 70 | Resp 20 | Ht 67.0 in | Wt 168.5 lb

## 2022-06-20 DIAGNOSIS — I10 Essential (primary) hypertension: Secondary | ICD-10-CM | POA: Diagnosis not present

## 2022-06-20 DIAGNOSIS — F4321 Adjustment disorder with depressed mood: Secondary | ICD-10-CM

## 2022-06-20 DIAGNOSIS — N952 Postmenopausal atrophic vaginitis: Secondary | ICD-10-CM

## 2022-06-20 DIAGNOSIS — E890 Postprocedural hypothyroidism: Secondary | ICD-10-CM

## 2022-06-20 DIAGNOSIS — F331 Major depressive disorder, recurrent, moderate: Secondary | ICD-10-CM

## 2022-06-20 DIAGNOSIS — Z23 Encounter for immunization: Secondary | ICD-10-CM | POA: Diagnosis not present

## 2022-06-20 DIAGNOSIS — M1991 Primary osteoarthritis, unspecified site: Secondary | ICD-10-CM | POA: Diagnosis not present

## 2022-06-20 DIAGNOSIS — F419 Anxiety disorder, unspecified: Secondary | ICD-10-CM

## 2022-06-20 MED ORDER — ESTRADIOL 0.1 MG/GM VA CREA
1.0000 | TOPICAL_CREAM | Freq: Every day | VAGINAL | 12 refills | Status: AC
  Filled 2022-06-20: qty 42.5, 30d supply, fill #0
  Filled 2022-11-22: qty 42.5, 30d supply, fill #1

## 2022-06-20 MED ORDER — MELOXICAM 15 MG PO TABS
15.0000 mg | ORAL_TABLET | Freq: Every day | ORAL | 1 refills | Status: DC
  Filled 2022-06-20: qty 90, 90d supply, fill #0
  Filled 2022-11-22: qty 90, 90d supply, fill #1

## 2022-06-21 ENCOUNTER — Other Ambulatory Visit (HOSPITAL_COMMUNITY): Payer: Self-pay

## 2022-06-21 ENCOUNTER — Other Ambulatory Visit: Payer: Self-pay | Admitting: Medical-Surgical

## 2022-06-21 ENCOUNTER — Encounter: Payer: Self-pay | Admitting: Medical-Surgical

## 2022-06-21 DIAGNOSIS — E890 Postprocedural hypothyroidism: Secondary | ICD-10-CM

## 2022-06-21 LAB — TSH: TSH: 4.72 mIU/L — ABNORMAL HIGH (ref 0.40–4.50)

## 2022-06-21 MED ORDER — LEVOTHYROXINE SODIUM 150 MCG PO TABS
150.0000 ug | ORAL_TABLET | Freq: Every day | ORAL | 0 refills | Status: DC
  Filled 2022-06-21: qty 90, 90d supply, fill #0

## 2022-06-22 ENCOUNTER — Other Ambulatory Visit (HOSPITAL_COMMUNITY): Payer: Self-pay

## 2022-06-24 ENCOUNTER — Other Ambulatory Visit (HOSPITAL_COMMUNITY): Payer: Self-pay

## 2022-06-27 ENCOUNTER — Other Ambulatory Visit (HOSPITAL_COMMUNITY): Payer: Self-pay

## 2022-06-28 ENCOUNTER — Other Ambulatory Visit (HOSPITAL_COMMUNITY): Payer: Self-pay

## 2022-06-29 ENCOUNTER — Ambulatory Visit (INDEPENDENT_AMBULATORY_CARE_PROVIDER_SITE_OTHER): Payer: 59 | Admitting: Medical-Surgical

## 2022-06-29 ENCOUNTER — Other Ambulatory Visit (HOSPITAL_COMMUNITY): Payer: Self-pay

## 2022-06-29 VITALS — BP 154/79 | HR 73 | Temp 97.8°F

## 2022-06-29 DIAGNOSIS — Z23 Encounter for immunization: Secondary | ICD-10-CM

## 2022-06-29 NOTE — Progress Notes (Signed)
Agree with documentation as below.  ___________________________________________ Race Latour L. Lexis Potenza, DNP, APRN, FNP-BC [facility]  

## 2022-06-29 NOTE — Progress Notes (Signed)
Patient is here for a flu vaccine. Verified no previous allergy to flu vaccine, eggs, or latex. Flu injection to right deltoid with no apparent complications. Patient advised to call with any problems.   Covid vaccine administered in left deltoid with no complications.

## 2022-07-30 ENCOUNTER — Telehealth: Payer: Self-pay | Admitting: Family Medicine

## 2022-07-30 MED ORDER — PREDNISONE 20 MG PO TABS: ORAL_TABLET | ORAL | 0 refills | Status: DC

## 2022-07-30 MED ORDER — BENZONATATE 200 MG PO CAPS: 200.0000 mg | ORAL_CAPSULE | Freq: Three times a day (TID) | ORAL | 0 refills | Status: AC | PRN

## 2022-07-30 NOTE — Telephone Encounter (Signed)
Prednisone/Tessalon sent to patient's pharmacy.

## 2022-08-04 ENCOUNTER — Ambulatory Visit
Admission: RE | Admit: 2022-08-04 | Discharge: 2022-08-04 | Disposition: A | Discharge status: Home / Self Care | Payer: 59 | Source: Ambulatory Visit | Attending: Urgent Care | Admitting: Urgent Care

## 2022-08-04 ENCOUNTER — Encounter: Payer: Self-pay | Admitting: Medical-Surgical

## 2022-08-04 ENCOUNTER — Ambulatory Visit (INDEPENDENT_AMBULATORY_CARE_PROVIDER_SITE_OTHER): Payer: 59

## 2022-08-04 VITALS — BP 135/82 | HR 79 | Temp 98.9°F | Resp 20

## 2022-08-04 DIAGNOSIS — R509 Fever, unspecified: Secondary | ICD-10-CM | POA: Diagnosis not present

## 2022-08-04 DIAGNOSIS — R059 Cough, unspecified: Secondary | ICD-10-CM | POA: Diagnosis not present

## 2022-08-04 DIAGNOSIS — J209 Acute bronchitis, unspecified: Secondary | ICD-10-CM

## 2022-08-04 MED ORDER — ALBUTEROL SULFATE (2.5 MG/3ML) 0.083% IN NEBU
2.5000 mg | INHALATION_SOLUTION | Freq: Once | RESPIRATORY_TRACT | Status: AC
  Administered 2022-08-04: 2.5 mg via RESPIRATORY_TRACT

## 2022-08-04 MED ORDER — PREDNISONE 50 MG PO TABS: 50.0000 mg | ORAL_TABLET | Freq: Every day | ORAL | 0 refills | Status: DC

## 2022-08-04 NOTE — ED Triage Notes (Signed)
Pt reports coughing spells, runny nose, fatigue for 8 days nw . Did have a fever first 2-3 days.she has some rattling in her chest . Took mucinex dm, coricdin hbp, tessalon which provided slight relief.

## 2022-08-04 NOTE — ED Provider Notes (Signed)
[facility]  Note:  This document was prepared using Systems analyst and may include unintentional dictation errors.  MRN: [id number] DOB: 17-Oct-1955  Subjective:   Destiny Graham is a 66 y.o. female presenting for 8-day history of acute onset persistent coughing, chest rattling and abnormal lung sounds.  Patient is becoming more fatigued.  Has had fevers initially but none in the past few days.  He is using multiple measures to try and help her coughing fits.  Did COVID testing that was negative.  No history of asthma.  Patient is not a smoker.  She has had prednisone in the past and typically did better with a higher dose than 40 mg.   No current facility-administered medications for this encounter.  Current Outpatient Medications:    acetaminophen (TYLENOL) 500 MG tablet, Take 1,000 mg by mouth every 6 (six) hours as needed for mild pain or headache. , Disp: , Rfl:    atenolol (TENORMIN) 50 MG tablet, Take 1 tablet (50 mg total) by mouth daily., Disp: 90 tablet, Rfl: 2   benzonatate (TESSALON) 200 MG capsule, Take 1 capsule (200 mg total) by mouth 3 (three) times daily as needed for up to 7 days., Disp: 40 capsule, Rfl: 0   DULoxetine (CYMBALTA) 60 MG capsule, Take 1 capsule by mouth daily., Disp: , Rfl:    estradiol (ESTRACE VAGINAL) 0.1 MG/GM vaginal cream, Place 1 Applicatorful vaginally at bedtime., Disp: 42.5 g, Rfl: 12   levothyroxine (SYNTHROID) 150 MCG tablet, Take 1 tablet (150 mcg total) by mouth daily., Disp: 90 tablet, Rfl: 0   meloxicam (MOBIC) 15 MG tablet, Take 1 tablet (15 mg total) by mouth daily., Disp: 90 tablet, Rfl: 1   naproxen sodium (ALEVE) 220 MG tablet, Take 440 mg by mouth 2 (two) times daily as needed (arthritis pain)., Disp: , Rfl:    nystatin (MYCOSTATIN/NYSTOP) powder, Apply to affected areas 2 times daily, Disp: 60 g, Rfl: 3   omeprazole (PRILOSEC) 20 MG capsule, Take 1 capsule (20 mg total) by mouth daily., Disp: 90  capsule, Rfl: 3   predniSONE (DELTASONE) 20 MG tablet, Take 2 tabs PO daily x 5 days., Disp: 10 tablet, Rfl: 0   Propylene Glycol (SYSTANE COMPLETE OP), Place 1 drop into both eyes daily as needed (irritation)., Disp: , Rfl:    rosuvastatin (CRESTOR) 20 MG tablet, TAKE 1 TABLET BY MOUTH ONCE A DAY (Patient taking differently: Take 20 mg by mouth daily.), Disp: 90 tablet, Rfl: 3   Tretinoin (RETIN-A EX), Apply 1 application. topically 2 (two) times a week., Disp: , Rfl:    Allergies  Allergen Reactions   Sulfamethoxazole-Trimethoprim Other (See Comments)    [name] Rash   Shingrix [Zoster Vac Recomb Adjuvanted] Other (See Comments)    Localized reaction with fever, hypotension, vertigo   Vioxx [Rofecoxib] Other (See Comments)    Photodermatitis     Past Medical History:  Diagnosis Date   Anxiety    Phreesia 07/27/2020   Arthritis    Phreesia 07/27/2020   Breast nodule    Cataract    Phreesia 07/27/2020   DDD (degenerative disc disease), cervical    Dysthymia    Generalized OA    GERD (gastroesophageal reflux disease)    Hallux rigidus    Heart murmur    beneign   Hyperlipidemia    Hypertension    OSA on CPAP    Does not use a cpap   Pseudophakia    Sleep  apnea    Phreesia 07/27/2020   Thyroid disease    Uterine fibroid      Past Surgical History:  Procedure Laterality Date   BUNIONECTOMY     CATARACT EXTRACTION  2016   EYE SURGERY     FRACTURE SURGERY N/A    Phreesia 07/27/2020   fusion   2015   first MTP B   HARDWARE REMOVAL Left 06/15/2017   Procedure: Left Great Toe Hardware Removal;  Surgeon: Newt Minion, MD;  Location: [facility];  Service: Orthopedics;  Laterality: Left;   LAPAROSCOPIC APPENDECTOMY N/A 02/01/2022   Procedure: APPENDECTOMY LAPAROSCOPIC;  Surgeon: Clovis Riley, MD;  Location: [facility];  Service: General;  Laterality: N/A;   MYOMECTOMY  1992   ORIF FINGER FRACTURE  2006   PAROTIDECTOMY Right 05/14/2020   Procedure: RIGHT SUPERFICIAL  PAROTIDECTOMY;  Surgeon: Jerrell Belfast, MD;  Location: [facility];  Service: ENT;  Laterality: Right;  REQUESTING RNFA   THYROIDECTOMY  2005   TOENAIL EXCISION Right 06/15/2017   Procedure: Right Great Toe Excision Lateral Border Nail;  Surgeon: Newt Minion, MD;  Location: [facility];  Service: Orthopedics;  Laterality: Right;   TONSILLECTOMY  1960    Family History  Problem Relation Age of Onset   Arthritis Mother    Depression Mother    COPD Father    Hearing loss Father    Heart disease Father    Hyperlipidemia Father    Hypertension Father    Kidney disease Father    Alcohol abuse Father    Depression Sister    Alcohol abuse Sister    Heart disease Maternal Grandmother        CHF   COPD Maternal Grandfather    Alcohol abuse Paternal Grandmother    Depression Sister    Heart disease Sister    Hyperlipidemia Sister    Depression Sister    Heart disease Sister    Hyperlipidemia Sister     Social History   Tobacco Use   Smoking status: Never   Smokeless tobacco: Never  Vaping Use   Vaping Use: Never used  Substance Use Topics   Alcohol use: Yes    Alcohol/week: 6.0 - 7.0 standard drinks of alcohol    Types: 6 - 7 Standard drinks or equivalent per week   Drug use: Never    ROS   Objective:   Vitals: BP 135/82 (BP Location: Right Arm)   Pulse 79   Temp 98.9 F (37.2 C) (Oral)   Resp 20   SpO2 98%   Physical Exam Constitutional:      General: She is not in acute distress.    Appearance: Normal appearance. She is well-developed. She is not ill-appearing, toxic-appearing or diaphoretic.  HENT:     Head: Normocephalic and atraumatic.     Nose: Nose normal.     Mouth/Throat:     Mouth: Mucous membranes are moist.  Eyes:     General: No scleral icterus.       Right eye: No discharge.        Left eye: No discharge.     Extraocular Movements: Extraocular movements intact.  Cardiovascular:     Rate and Rhythm: Normal rate and regular rhythm.     Heart  sounds: Normal heart sounds. No murmur heard.    No friction rub. No gallop.  Pulmonary:     Effort: Pulmonary effort is normal. No respiratory distress.     Breath sounds: No stridor. Rhonchi (  diffuse throughout) present. No wheezing or rales.  Chest:     Chest wall: No tenderness.  Skin:    General: Skin is warm and dry.  Neurological:     General: No focal deficit present.     Mental Status: She is alert and oriented to person, place, and time.  Psychiatric:        Mood and Affect: Mood normal.        Behavior: Behavior normal.    DG Chest 2 View  Result Date: 08/04/2022 CLINICAL DATA:  Cough, fever. EXAM: CHEST - 2 VIEW COMPARISON:  December 13, 2016. FINDINGS: The heart size and mediastinal contours are within normal limits. Both lungs are clear. The visualized skeletal structures are unremarkable. IMPRESSION: No active cardiopulmonary disease. Electronically Signed   By: Marijo Conception M.D.   On: 08/04/2022 15:06    We will use a trial of a nebulized albuterol treatment but unfortunately did not provide much symptomatic relief.  Assessment and Plan :   PDMP not reviewed this encounter.  1. Acute bronchitis, unspecified organism     We will defer albuterol use.  Recommended an oral prednisone course of 50 mg for 5 days.  Use supportive care otherwise.  We will avoid a repeat COVID test that she already tested negative.  Otherwise, chest x-ray is negative for pneumonia.    Jaynee Eagles, [place] 08/04/22 1845

## 2022-08-06 ENCOUNTER — Other Ambulatory Visit (HOSPITAL_COMMUNITY): Payer: Self-pay

## 2022-08-06 ENCOUNTER — Telehealth: Payer: Self-pay | Admitting: Urgent Care

## 2022-08-06 MED ORDER — HYDROCODONE BIT-HOMATROP MBR 5-1.5 MG/5ML PO SOLN: 5.0000 mL | Freq: Every evening | ORAL | 0 refills | Status: DC | PRN

## 2022-08-06 MED ORDER — HYDROCODONE BIT-HOMATROP MBR 5-1.5 MG/5ML PO SOLN
5.0000 mL | Freq: Every evening | ORAL | 0 refills | Status: DC | PRN
  Filled 2022-08-06: qty 50, 10d supply, fill #0

## 2022-08-06 NOTE — Telephone Encounter (Signed)
Patient is having a difficult time resting from her cough at night.  Recommended Hycodan as she is already using prednisone at 50 mg and promethazine dextromethorphan cough syrup.  Chest x-ray was negative.  She does report some improvement in her wheezing and breathing.  Therefore we will simply attempt to help with the Hycodan cough syrup at bedtime.

## 2022-08-07 ENCOUNTER — Other Ambulatory Visit (HOSPITAL_COMMUNITY): Payer: Self-pay

## 2022-08-08 NOTE — Telephone Encounter (Signed)
FYI

## 2022-08-22 ENCOUNTER — Other Ambulatory Visit (HOSPITAL_COMMUNITY): Payer: Self-pay

## 2022-08-22 ENCOUNTER — Other Ambulatory Visit: Payer: Self-pay | Admitting: Medical-Surgical

## 2022-08-22 MED ORDER — OMEPRAZOLE 20 MG PO CPDR
20.0000 mg | DELAYED_RELEASE_CAPSULE | Freq: Every day | ORAL | 3 refills | Status: DC
  Filled 2022-08-22 – 2022-08-23 (×2): qty 90, 90d supply, fill #0
  Filled 2022-11-22: qty 90, 90d supply, fill #1
  Filled 2023-02-20: qty 90, 90d supply, fill #2
  Filled 2023-05-20: qty 90, 90d supply, fill #3

## 2022-08-23 ENCOUNTER — Other Ambulatory Visit (HOSPITAL_COMMUNITY): Payer: Self-pay

## 2022-08-23 ENCOUNTER — Other Ambulatory Visit: Payer: Self-pay

## 2022-09-08 ENCOUNTER — Other Ambulatory Visit: Payer: Self-pay

## 2022-09-08 ENCOUNTER — Other Ambulatory Visit: Payer: Self-pay | Admitting: Medical-Surgical

## 2022-09-08 MED ORDER — ROSUVASTATIN CALCIUM 20 MG PO TABS
20.0000 mg | ORAL_TABLET | Freq: Every day | ORAL | 3 refills | Status: DC
  Filled 2022-09-08: qty 90, 90d supply, fill #0
  Filled 2022-12-07 – 2022-12-17 (×2): qty 90, 90d supply, fill #1
  Filled 2023-02-20 – 2023-03-14 (×2): qty 90, 90d supply, fill #2
  Filled 2023-06-11: qty 90, 90d supply, fill #3

## 2022-09-10 IMAGING — CT CT CARDIAC CORONARY ARTERY CALCIUM SCORE
2 series · 14 of 20 positions shown, 16 images · non-contrast
Comparison: None.
COMPARISON: None.

Addendum:
EXAM:
OVER-READ INTERPRETATION  CT CHEST

The following report is an over-read performed by radiologist Dr.
over-read does not include interpretation of cardiac or coronary
anatomy or pathology. The coronary calcium score interpretation by
the cardiologist is attached.
CLINICAL DATA: 66F with hypertension and hyperlipidemia for
Cardiovascular Disease Risk stratification
Coronary Calcium Score
TECHNIQUE: A gated, non-contrast computed tomography scan of the heart was
performed using 3mm slice thickness. Axial images were analyzed on a
dedicated workstation. Calcium scoring of the coronary arteries was
performed using the Agatston method.

[Series 2: casc 3.0 i36f 2 bestdiast 70 % · axial · 0.39mm/px · z∈[-56,+70]mm · 8 of 55 slices shown, 10 images]
[im 7/55  vessel]
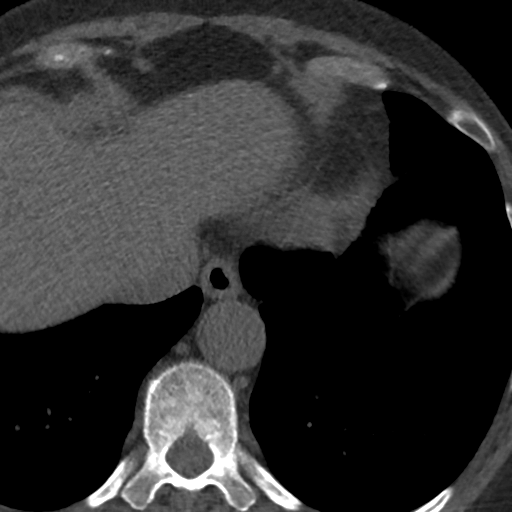
[im 7/55  lung]
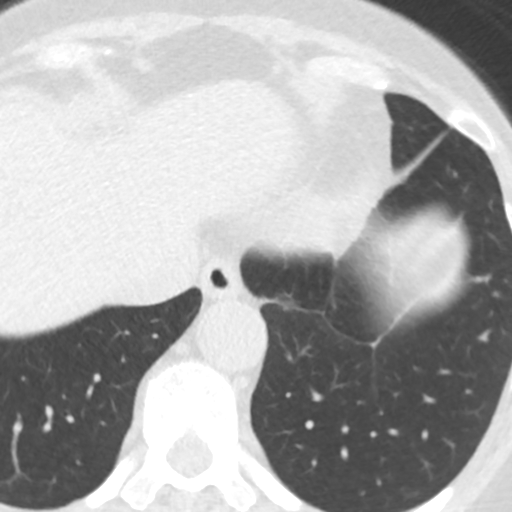
[im 13/55  vessel]
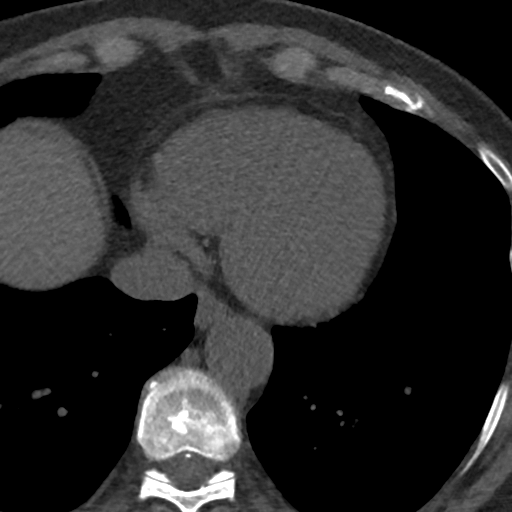
[im 19/55  vessel]
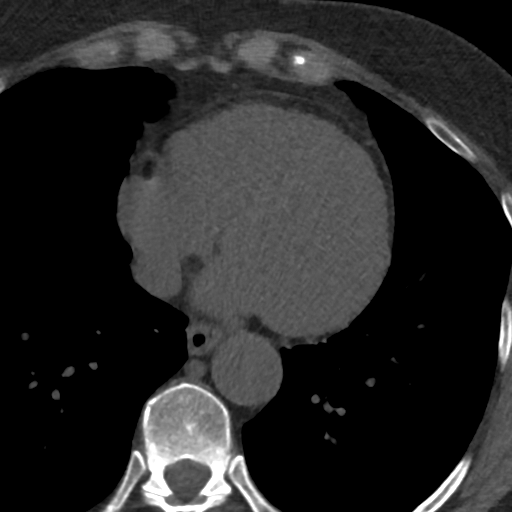
[im 25/55  vessel]
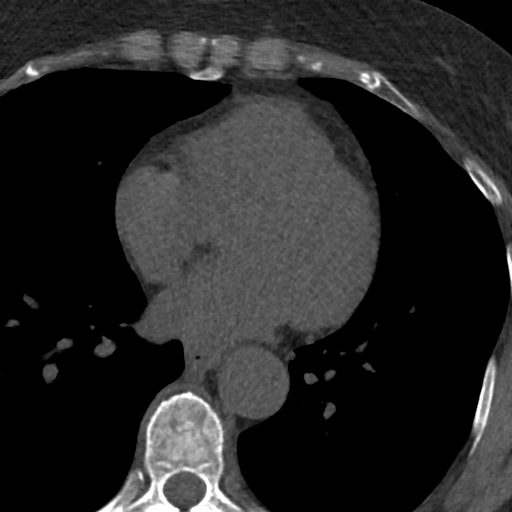
[im 31/55  vessel]
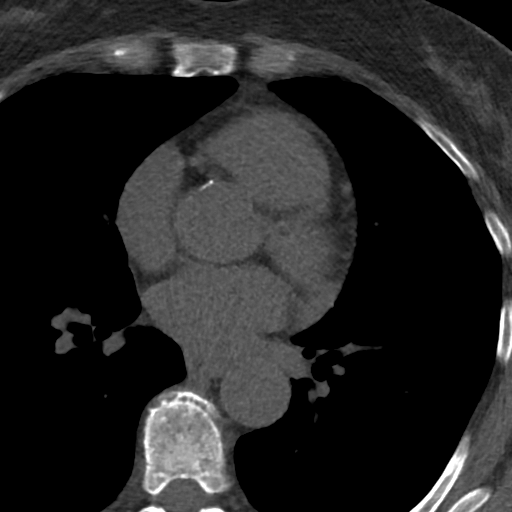
[im 31/55  lung]
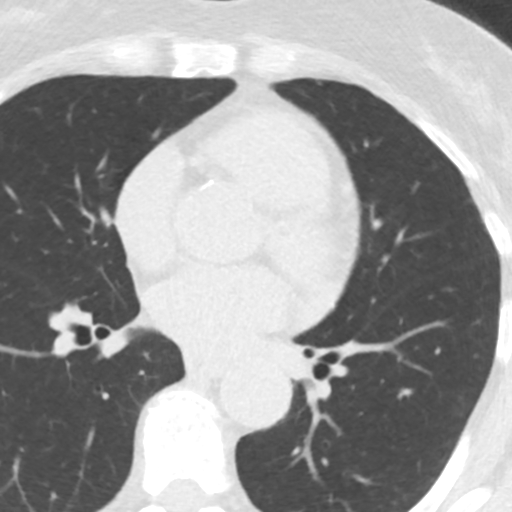
[im 37/55  vessel]
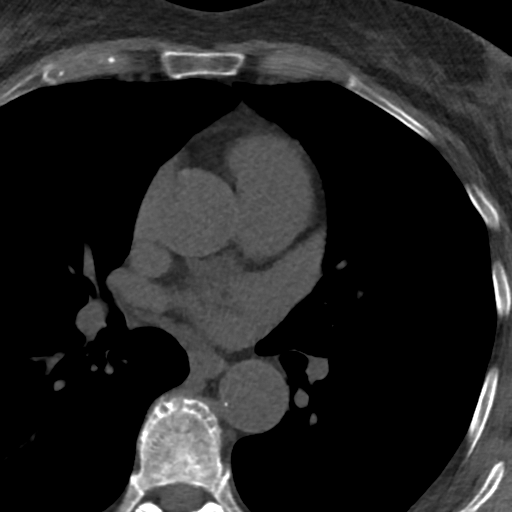
[im 43/55  vessel]
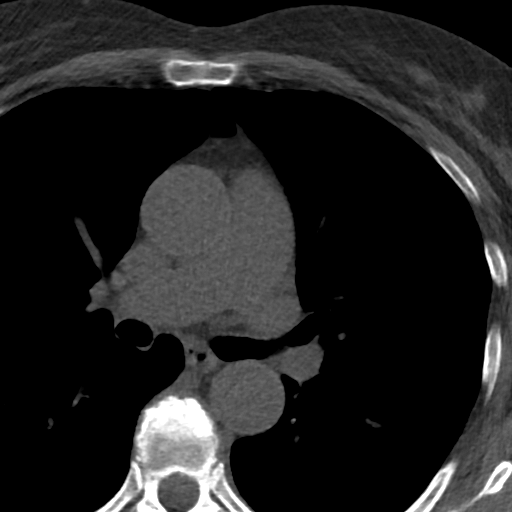
[im 49/55  vessel]
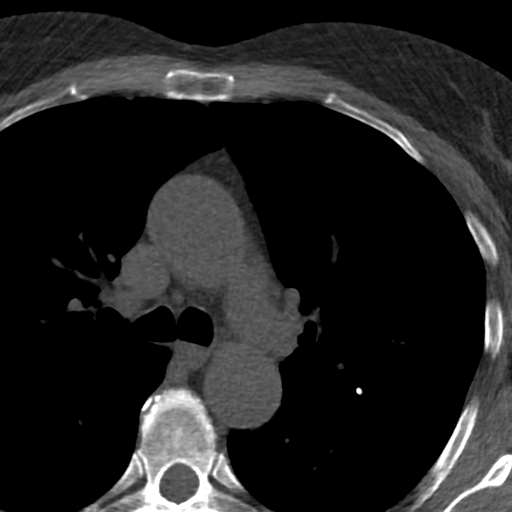

[Series 4: lung st 70 % · axial · 0.78mm/px · z∈[-56,+34]mm · 6 of 55 slices shown]
[im 7/55  lung]
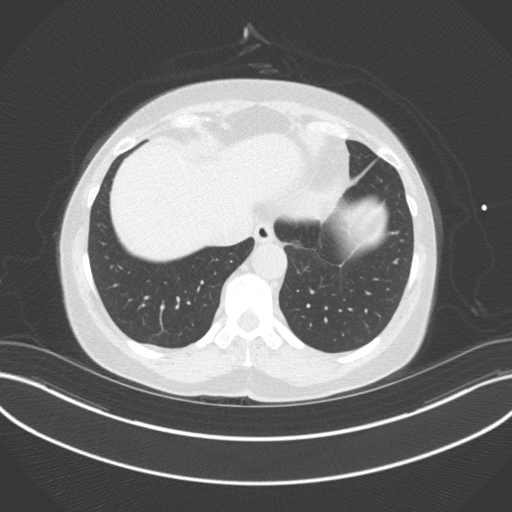
[im 13/55  lung]
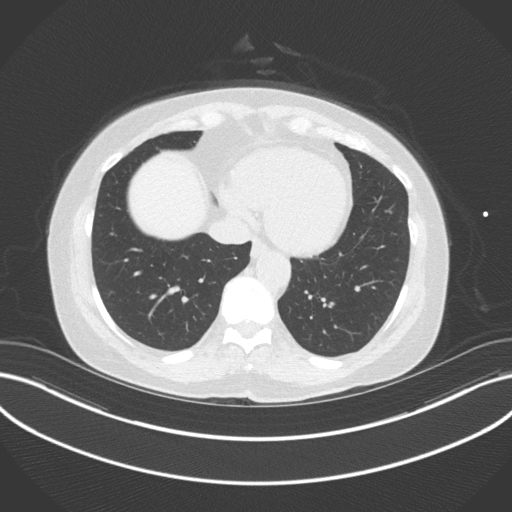
[im 19/55  lung]
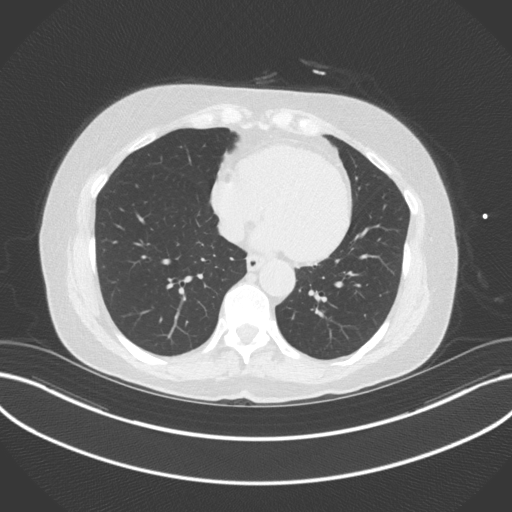
[im 25/55  lung]
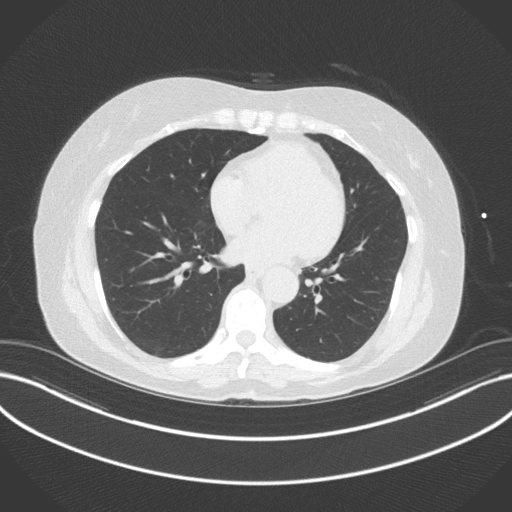
[im 31/55  lung]
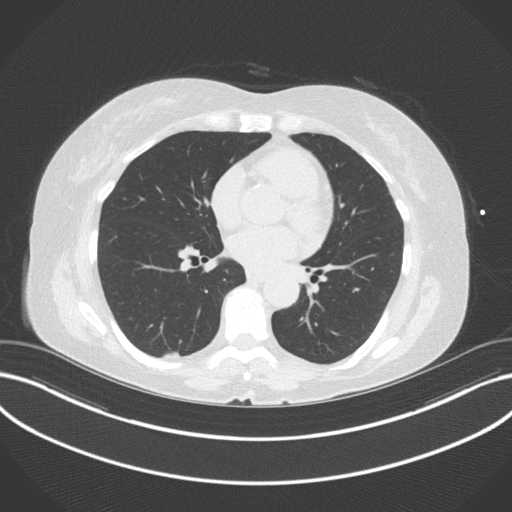
[im 37/55  lung]
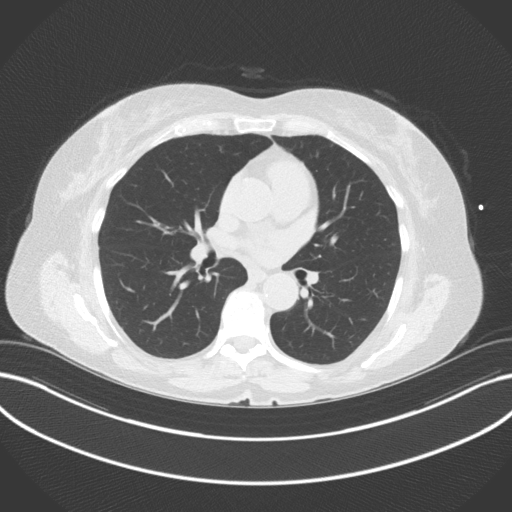

[14 of 20 positions shown; findings below may reference images not displayed]

FINDINGS: Vascular: No significant noncardiac vascular findings. There is a
small amount of calcified plaque in the visualized thoracic aorta.

Mediastinum/Nodes: Visualized mediastinum and hilar regions
demonstrate no lymphadenopathy or masses.

Lungs/Pleura: 3 x 4 mm ill-defined nodule is present in the
subpleural lateral right lower lobe on image number 39. Visualized
lungs show no evidence of pulmonary edema, consolidation,
pneumothorax or pleural fluid.

Upper Abdomen: No acute abnormality.

Musculoskeletal: No chest wall mass or suspicious bone lesions
identified.
IMPRESSION: 1. 3 x 4 mm ill-defined nodule in the subpleural lateral right lower
lobe. No follow-up needed if patient is low-risk. Non-contrast chest
CT can be considered in 12 months if patient is high-risk. This
recommendation follows the consensus statement: Guidelines for
Management of Incidental Pulmonary Nodules Detected on CT Images:
2. Small amount of calcified plaque in the visualized thoracic
aorta.

Aortic Atherosclerosis (BADE5-7ID.D).
FINDINGS: Coronary arteries: Normal origins.

Coronary Calcium Score:

Left main: 0

Left anterior descending artery: 0

Left circumflex artery: 0

Right coronary artery: 0

Total: 0

Percentile: 0

Pericardium: Normal.

Ascending Aorta: Normal caliber. Minimal calcification of the aortic
root.

Non-cardiac: See separate report from [REDACTED].
IMPRESSION: Coronary calcium score of 0. This was 0 percentile for age-, race-,
and sex-matched controls.



If CAC=0, it is reasonable to withhold statin therapy and reassess
in 5 to 10 years, as long as higher risk conditions are absent
(diabetes mellitus, family history of premature CHD in first degree
relatives (males <55 years; females <65 years), cigarette smoking,
or LDL >=190 mg/dL).

If CAC is 1 to 99, it is reasonable to initiate statin therapy for
patients >=55 years of age.

If CAC is >=100 or >=75th percentile, it is reasonable to initiate
statin therapy at any age.

Cardiology referral should be considered for patients with CAC
scores >=400 or >=75th percentile.

*0605 AHA/ACC/AACVPR/AAPA/ABC/FAEIZ/ISA LINDA/MOLITOR/Laaouina/BURGONYA/ABDIKAAFI AHMAD/MERCIEL
Guideline on the Management of Blood Cholesterol: A Report of the
American College of Cardiology/American Heart Association Task Force
on Clinical Practice Guidelines. J Am Coll Cardiol.
4836;73(24):4106-4734.

*** End of Addendum ***
EXAM:
OVER-READ INTERPRETATION  CT CHEST

The following report is an over-read performed by radiologist Dr.
over-read does not include interpretation of cardiac or coronary
anatomy or pathology. The coronary calcium score interpretation by
the cardiologist is attached.
FINDINGS: Vascular: No significant noncardiac vascular findings. There is a
small amount of calcified plaque in the visualized thoracic aorta.

Mediastinum/Nodes: Visualized mediastinum and hilar regions
demonstrate no lymphadenopathy or masses.

Lungs/Pleura: 3 x 4 mm ill-defined nodule is present in the
subpleural lateral right lower lobe on image number 39. Visualized
lungs show no evidence of pulmonary edema, consolidation,
pneumothorax or pleural fluid.

Upper Abdomen: No acute abnormality.

Musculoskeletal: No chest wall mass or suspicious bone lesions
identified.
IMPRESSION: 1. 3 x 4 mm ill-defined nodule in the subpleural lateral right lower
lobe. No follow-up needed if patient is low-risk. Non-contrast chest
CT can be considered in 12 months if patient is high-risk. This
recommendation follows the consensus statement: Guidelines for
Management of Incidental Pulmonary Nodules Detected on CT Images:
2. Small amount of calcified plaque in the visualized thoracic
aorta.

Aortic Atherosclerosis (BADE5-7ID.D).

## 2022-09-18 ENCOUNTER — Other Ambulatory Visit: Payer: Self-pay | Admitting: Medical-Surgical

## 2022-09-18 ENCOUNTER — Other Ambulatory Visit: Payer: Self-pay

## 2022-09-18 MED ORDER — LEVOTHYROXINE SODIUM 150 MCG PO TABS
150.0000 ug | ORAL_TABLET | Freq: Every day | ORAL | 0 refills | Status: DC
  Filled 2022-09-18: qty 90, 90d supply, fill #0

## 2022-09-19 ENCOUNTER — Other Ambulatory Visit (HOSPITAL_COMMUNITY): Payer: Self-pay

## 2022-10-18 ENCOUNTER — Telehealth (HOSPITAL_COMMUNITY): Payer: Self-pay | Admitting: Family Medicine

## 2022-10-18 MED ORDER — OSELTAMIVIR PHOSPHATE 75 MG PO CAPS: 75.0000 mg | ORAL_CAPSULE | Freq: Two times a day (BID) | ORAL | 0 refills | Status: AC

## 2022-10-18 NOTE — Telephone Encounter (Signed)
Positive flu test. Requests Tamiflu. Sent.  Meds ordered this encounter  Medications   oseltamivir (TAMIFLU) 75 MG capsule    Sig: Take 1 capsule (75 mg total) by mouth 2 (two) times daily for 5 days.    Dispense:  10 capsule    Refill:  0

## 2022-11-06 IMAGING — DX DG FOOT COMPLETE 3+V*L*
3 series · 3 of 3 positions shown · non-contrast
Comparison: None.

CLINICAL DATA: 65-year-old female with bruising of the second and
third toe.

EXAM:
LEFT FOOT - COMPLETE 3+ VIEW

[foot ap]
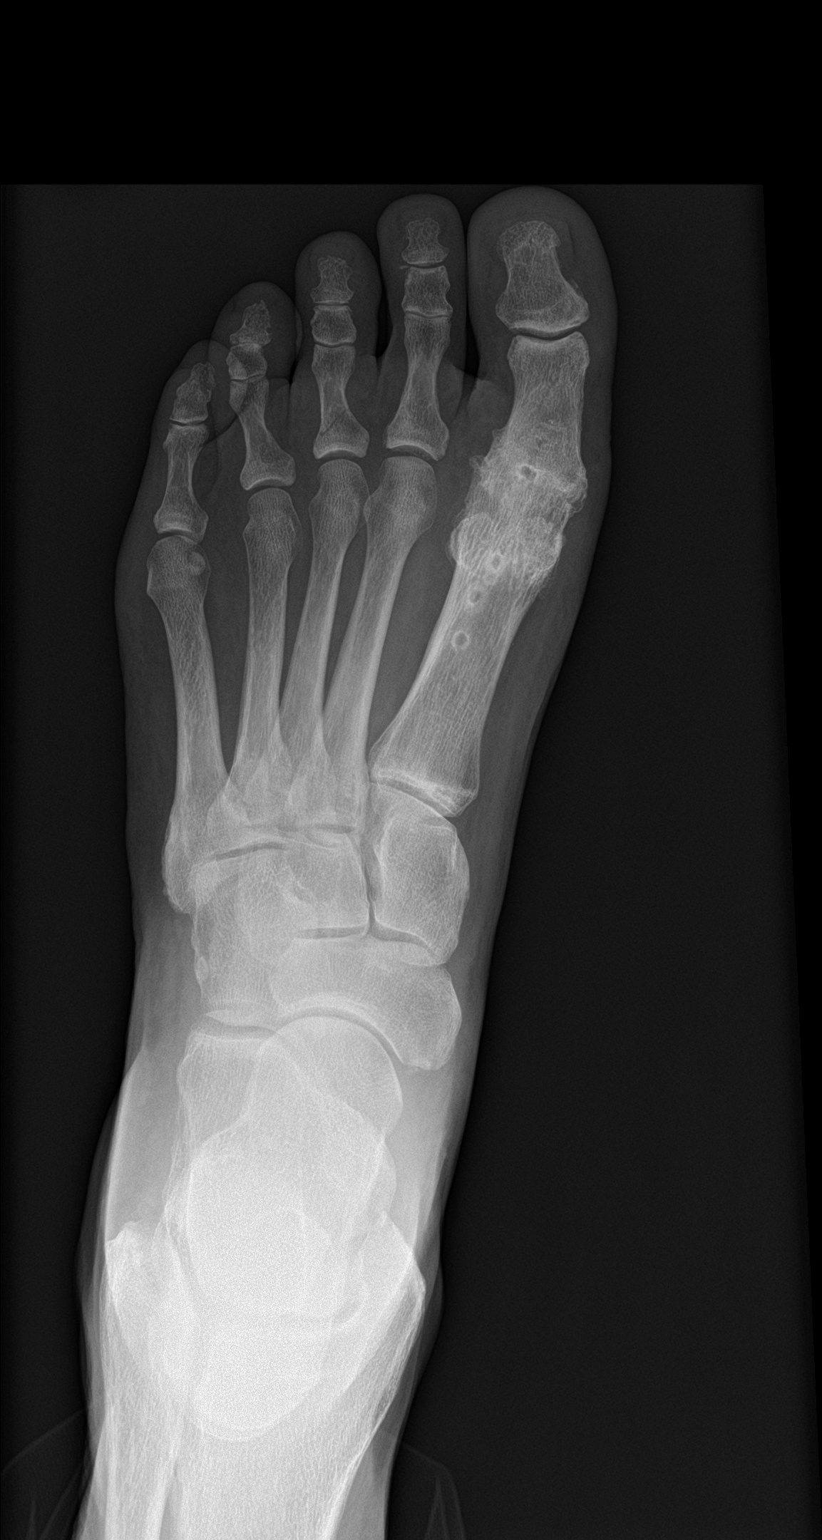

[foot obl]
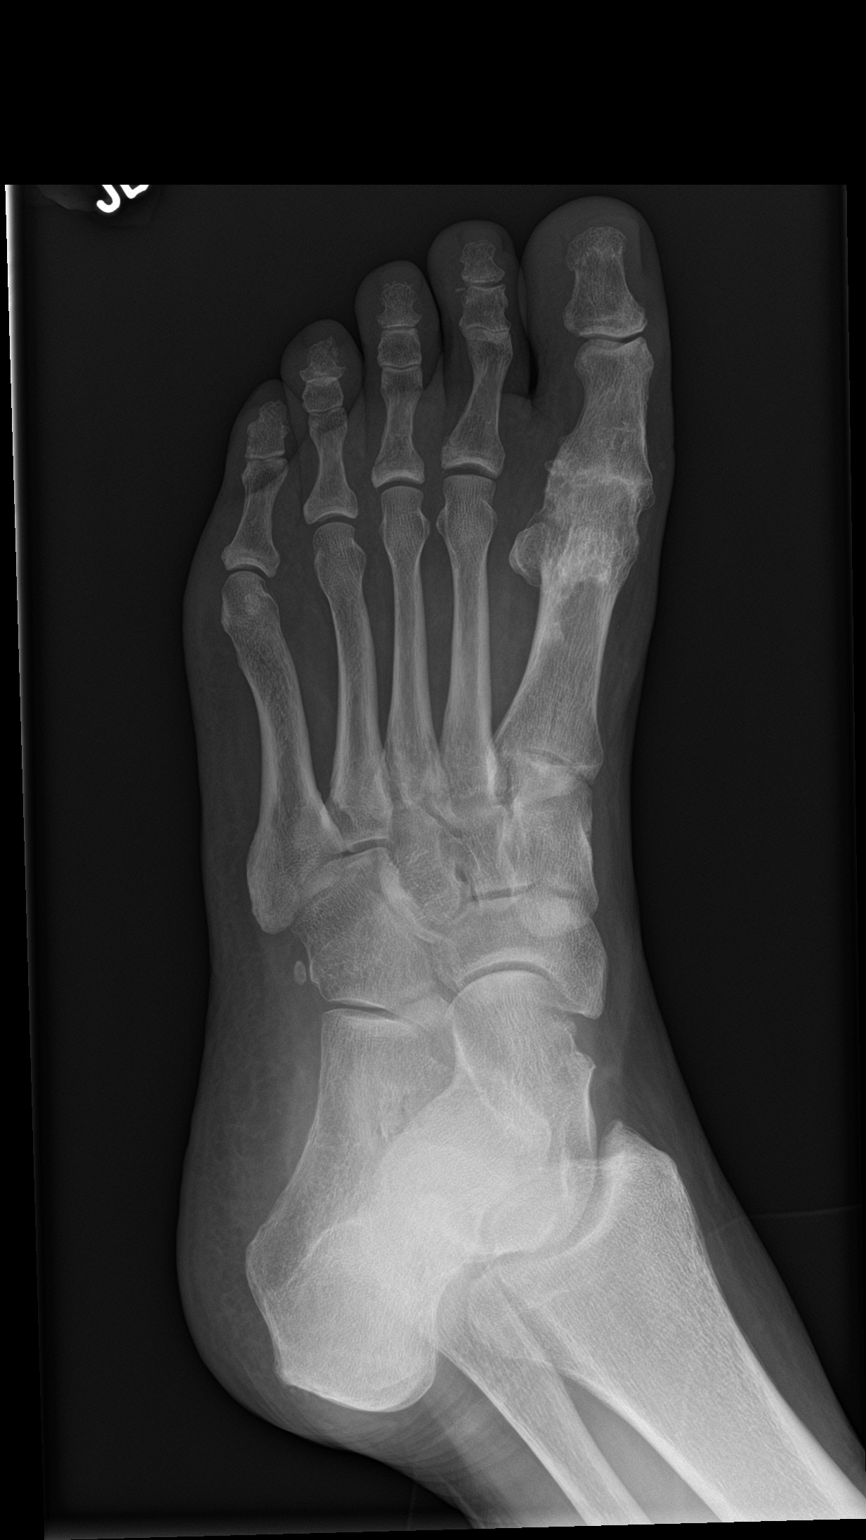

[foot lat]
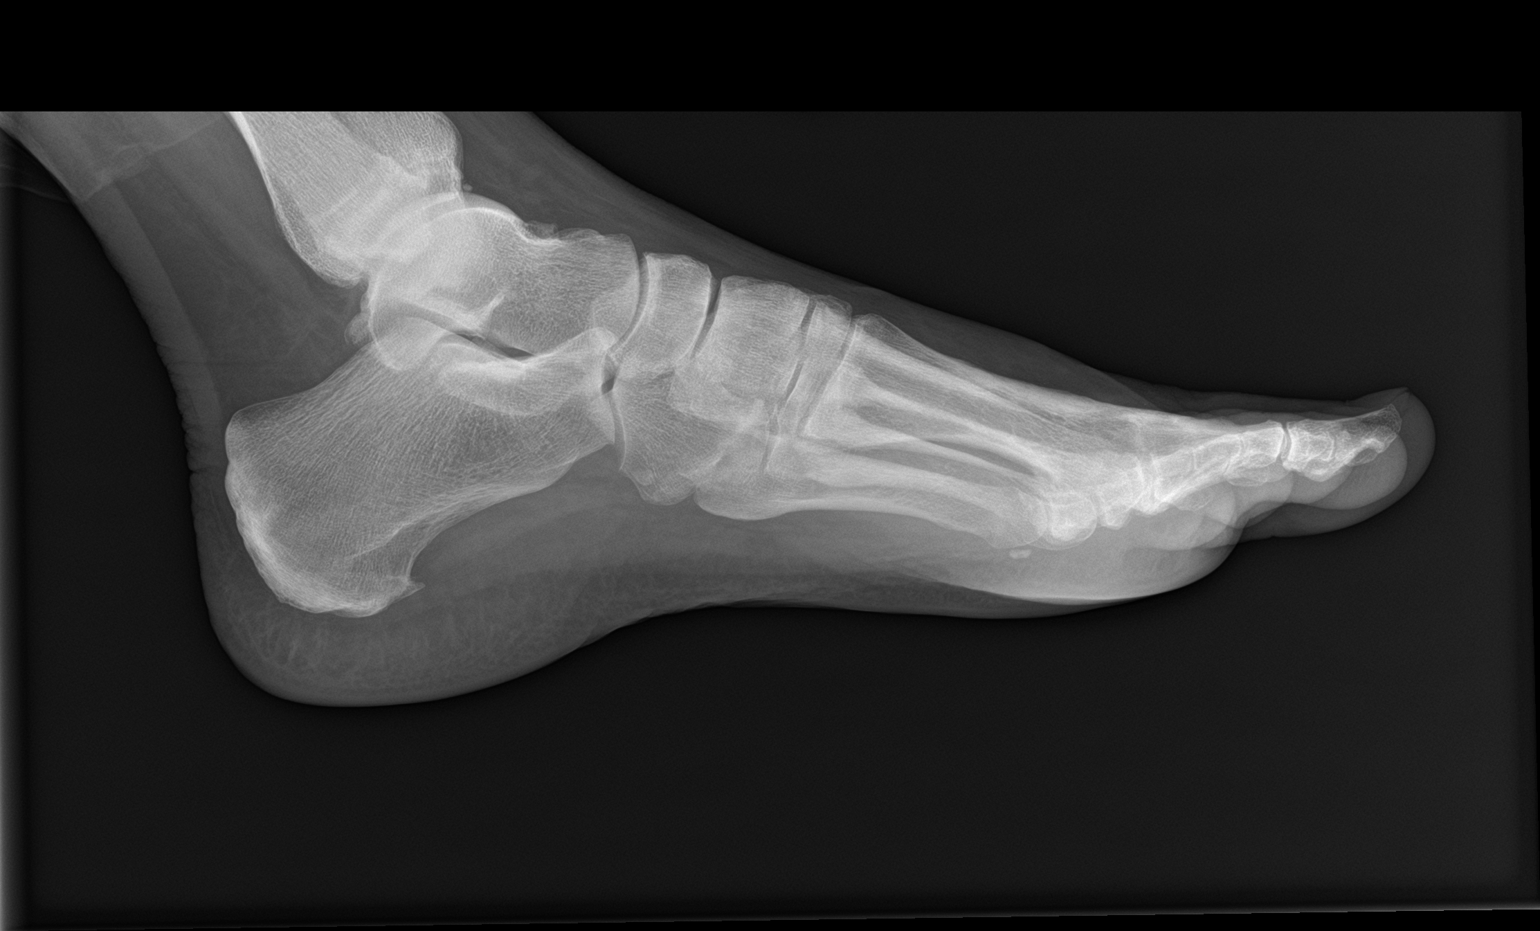

[3 of 3 positions shown; findings below may reference images not displayed]

FINDINGS: Focal osseous density about the lateral aspect of the second distal
interphalangeal joint. Nondisplaced, horizontally oriented fracture
of the proximal aspect of the third proximal phalanx. Ankylosis of
the first metatarsophalangeal joint with ghost tracks from prior
operative hardware. Soft tissues are unremarkable.
IMPRESSION: 1. Acute, nondisplaced fracture of the third proximal phalanx.
2. Focal ossific density about the lateral aspect of the second
distal interphalangeal joint suggestive of avulsion injury.

## 2022-11-22 ENCOUNTER — Other Ambulatory Visit (HOSPITAL_COMMUNITY): Payer: Self-pay

## 2022-12-07 ENCOUNTER — Other Ambulatory Visit: Payer: Self-pay

## 2022-12-17 ENCOUNTER — Other Ambulatory Visit: Payer: Self-pay | Admitting: Medical-Surgical

## 2022-12-18 ENCOUNTER — Other Ambulatory Visit: Payer: Self-pay

## 2022-12-18 ENCOUNTER — Other Ambulatory Visit (HOSPITAL_COMMUNITY): Payer: Self-pay

## 2022-12-18 MED ORDER — LEVOTHYROXINE SODIUM 150 MCG PO TABS
150.0000 ug | ORAL_TABLET | Freq: Every day | ORAL | 0 refills | Status: DC
  Filled 2022-12-18: qty 90, 90d supply, fill #0

## 2022-12-25 ENCOUNTER — Encounter: Payer: Self-pay | Admitting: *Deleted

## 2023-01-13 ENCOUNTER — Emergency Department (HOSPITAL_COMMUNITY)
Admission: EM | Admit: 2023-01-13 | Discharge: 2023-01-14 | Disposition: A | Discharge status: Home / Self Care | Payer: Commercial Managed Care - PPO | Attending: Emergency Medicine | Admitting: Emergency Medicine

## 2023-01-13 ENCOUNTER — Other Ambulatory Visit: Payer: Self-pay

## 2023-01-13 ENCOUNTER — Emergency Department (HOSPITAL_COMMUNITY): Payer: Commercial Managed Care - PPO

## 2023-01-13 DIAGNOSIS — S99919A Unspecified injury of unspecified ankle, initial encounter: Secondary | ICD-10-CM | POA: Diagnosis not present

## 2023-01-13 DIAGNOSIS — Y92007 Garden or yard of unspecified non-institutional (private) residence as the place of occurrence of the external cause: Secondary | ICD-10-CM | POA: Insufficient documentation

## 2023-01-13 DIAGNOSIS — I10 Essential (primary) hypertension: Secondary | ICD-10-CM | POA: Diagnosis not present

## 2023-01-13 DIAGNOSIS — W19XXXA Unspecified fall, initial encounter: Secondary | ICD-10-CM | POA: Diagnosis not present

## 2023-01-13 DIAGNOSIS — S82842A Displaced bimalleolar fracture of left lower leg, initial encounter for closed fracture: Secondary | ICD-10-CM | POA: Diagnosis not present

## 2023-01-13 DIAGNOSIS — W010XXA Fall on same level from slipping, tripping and stumbling without subsequent striking against object, initial encounter: Secondary | ICD-10-CM | POA: Insufficient documentation

## 2023-01-13 DIAGNOSIS — S82832A Other fracture of upper and lower end of left fibula, initial encounter for closed fracture: Secondary | ICD-10-CM | POA: Diagnosis not present

## 2023-01-13 DIAGNOSIS — R600 Localized edema: Secondary | ICD-10-CM | POA: Diagnosis not present

## 2023-01-13 DIAGNOSIS — M25572 Pain in left ankle and joints of left foot: Secondary | ICD-10-CM | POA: Diagnosis not present

## 2023-01-13 MED ORDER — KETAMINE HCL 50 MG/5ML IJ SOSY
1.0000 mg/kg | PREFILLED_SYRINGE | Freq: Once | INTRAMUSCULAR | Status: AC
  Administered 2023-01-13: 79 mg via INTRAVENOUS
  Filled 2023-01-13: qty 10

## 2023-01-13 MED ORDER — HYDROMORPHONE HCL 1 MG/ML IJ SOLN
0.5000 mg | Freq: Once | INTRAMUSCULAR | Status: AC
  Administered 2023-01-13: 0.5 mg via INTRAVENOUS
  Filled 2023-01-13: qty 1

## 2023-01-13 MED ORDER — ONDANSETRON HCL 4 MG/2ML IJ SOLN
4.0000 mg | Freq: Once | INTRAMUSCULAR | Status: AC
  Administered 2023-01-13: 4 mg via INTRAVENOUS
  Filled 2023-01-13: qty 2

## 2023-01-13 NOTE — ED Provider Notes (Signed)
[place] EMERGENCY DEPARTMENT AT [facility] Provider Note   CSN: [id number] Arrival date & time: 01/13/23  2159     History  Chief Complaint  Patient presents with   Ankle Pain    Destiny Graham is a 67 y.o. female.  67 year old female presents to the emergency department from home for L ankle pain.  She was outside in the yard near her chicken coop when she slipped on a pile of leaves and fell to the ground.  She had to scoot on her bottom back to the house in order to call for assistance.  She noted deformity to her left ankle with constant pain.  Pain is aggravated with any degree of movement.  Rated fairly consistently at 6/10.  Did not receive any medications and route by EMS.  She denies any numbness or paresthesias of the extremity.  She has had prior bunion surgery on that foot by Dr. Lajoyce Corners.  The history is provided by the patient. No language interpreter was used.  Ankle Pain      Home Medications Prior to Admission medications   Medication Sig Start Date End Date Taking? Authorizing Provider  oxyCODONE-acetaminophen (PERCOCET/ROXICET) 5-325 MG tablet Take 1-2 tablets by mouth every 6 (six) hours as needed for severe pain. 01/14/23  Yes Horton, Mayer Masker, MD  acetaminophen (TYLENOL) 500 MG tablet Take 1,000 mg by mouth every 6 (six) hours as needed for mild pain or headache.     [provider]  atenolol (TENORMIN) 50 MG tablet Take 1 tablet (50 mg total) by mouth daily. 06/12/22   Christen Butter, NP  DULoxetine (CYMBALTA) 60 MG capsule Take 1 capsule by mouth daily. 11/29/21   [provider]  estradiol (ESTRACE VAGINAL) 0.1 MG/GM vaginal cream Place 1 Applicatorful vaginally at bedtime. 06/20/22   Christen Butter, NP  levothyroxine (SYNTHROID) 150 MCG tablet Take 1 tablet (150 mcg total) by mouth daily. 12/18/22   Christen Butter, NP  meloxicam (MOBIC) 15 MG tablet Take 1 tablet (15 mg total) by mouth daily. 06/20/22   Christen Butter, NP  naproxen sodium  (ALEVE) 220 MG tablet Take 440 mg by mouth 2 (two) times daily as needed (arthritis pain).    [provider]  nystatin (MYCOSTATIN/NYSTOP) powder Apply to affected areas 2 times daily 03/15/22     omeprazole (PRILOSEC) 20 MG capsule Take 1 capsule (20 mg total) by mouth daily. 08/22/22   Christen Butter, NP  predniSONE (DELTASONE) 50 MG tablet Take 1 tablet (50 mg total) by mouth daily with breakfast. 08/04/22   Wallis Bamberg, PA-C  Propylene Glycol (SYSTANE COMPLETE OP) Place 1 drop into both eyes daily as needed (irritation).    [provider]  rosuvastatin (CRESTOR) 20 MG tablet Take 1 tablet (20 mg total) by mouth daily. 09/08/22 09/08/23  Christen Butter, NP  Tretinoin (RETIN-A EX) Apply 1 application. topically 2 (two) times a week.    [provider]      Allergies    Sulfamethoxazole-trimethoprim, Shingrix [zoster vac recomb adjuvanted], and Vioxx [rofecoxib]    Review of Systems   Review of Systems Ten systems reviewed and are negative for acute change, except as noted in the HPI.    Physical Exam Updated Vital Signs BP 134/79 (BP Location: Right Arm)   Pulse 82   Temp 98.6 F (37 C) (Oral)   Resp 16   Ht 5\' 7"  (1.702 m)   Wt 79.4 kg   SpO2 97%   BMI 27.41 kg/m  Physical Exam Vitals and nursing note reviewed.  Constitutional:      General: She is not in acute distress.    Appearance: She is well-developed. She is not diaphoretic.     Comments: Appears uncomfortable intermittently. Nontoxic.  HENT:     Head: Normocephalic and atraumatic.  Eyes:     General: No scleral icterus.    Conjunctiva/sclera: Conjunctivae normal.  Cardiovascular:     Rate and Rhythm: Normal rate and regular rhythm.     Pulses: Normal pulses.     Comments: DP pulse 2+ in the LLE. Capillary refill brisk in the toes of the L foot. Pulmonary:     Effort: Pulmonary effort is normal. No respiratory distress.     Comments: Respirations even and unlabored Musculoskeletal:      Cervical back: Normal range of motion.     Comments: Ankle kept in EMS splint for stability. Obvious deformity noted to L ankle without crepitus or evidence of open fx. TTP at site of deformity.  Skin:    General: Skin is warm and dry.     Coloration: Skin is not pale.     Findings: No erythema or rash.  Neurological:     Mental Status: She is alert and oriented to person, place, and time.     Comments: Sensation to light touch intact in the distal LLE and foot. Patient able to wiggle all toes.  Psychiatric:        Behavior: Behavior normal.     ED Results / Procedures / Treatments   Labs (all labs ordered are listed, but only abnormal results are displayed) Labs Reviewed - No data to display  EKG None  Radiology CT Ankle Left Wo Contrast  Result Date: 01/14/2023 CLINICAL DATA:  Left ankle fracture dislocation EXAM: CT OF THE LEFT ANKLE WITHOUT CONTRAST TECHNIQUE: Multidetector CT imaging of the left ankle was performed according to the standard protocol. Multiplanar CT image reconstructions were also generated. RADIATION DOSE REDUCTION: This exam was performed according to the departmental dose-optimization program which includes automated exposure control, adjustment of the mA and/or kV according to patient size and/or use of iterative reconstruction technique. COMPARISON:  None Available. FINDINGS: Bones/Joint/Cartilage There is an acute, oblique fracture of the distal metaphyseal region of the fibula with 2 cortical with lateral displacement and near anatomic alignment of the distal fracture fragment. The fracture plane extends to the medial cortex 9 mm above the tibial plafond. There is additional tiny minimally displaced fractures of the posterior malleolus and posterior aspect of the medial malleolus. There is 2-3 mm lateral subluxation of the talar dome in relation to the tibial plafond with resultant widening of the medial joint space. No other fracture identified. No dislocation.  Tiny plantar calcaneal spur. Arthrodesis of the first metatarsophalangeal joint. Ligaments Suboptimally assessed by CT. Muscles and Tendons There is a soft tissue defect involving the visualized anterior left lower extremity with exposure of the a tibialis anterior and possibly the anterior cortex of the tibia at the superior margin of the examination. This is incompletely included on this exam. Soft tissues There is extensive subcutaneous edema laterally at the level of the fibular fracture. No loculated subcutaneous fluid collection identified. IMPRESSION: 1. Acute, oblique fracture of the distal fibula with 2 cortical with lateral displacement and near anatomic alignment of the distal fracture fragment. 2. Additional tiny minimally displaced fractures of the posterior malleolus and posterior aspect of the medial malleolus. 3. Mild lateral subluxation of the talar dome in relation  to the tibial plafond with resultant widening of the medial joint space. 4. Soft tissue defect involving the visualized anterior left lower extremity with exposure of the a tibialis anterior and possibly the anterior cortex of the tibia at the superior margin of the examination. This is incompletely included on this exam. Electronically Signed   By: Helyn Numbers M.D.   On: 01/14/2023 00:43   DG Ankle Left Port  Result Date: 01/14/2023 CLINICAL DATA:  Ankle fracture, postreduction EXAM: PORTABLE LEFT ANKLE - 2 VIEW COMPARISON:  None Available. FINDINGS: In splint views again demonstrate the oblique distal fibular fracture. Mild displacement but improved alignment since prior study. Reduction of the previously seen tibiotalar dislocation. IMPRESSION: Interval reduction of the tibiotalar dislocation and improved alignment across the distal fibular fracture. Electronically Signed   By: Charlett Nose M.D.   On: 01/14/2023 00:09   DG Ankle Complete Left  Result Date: 01/13/2023 CLINICAL DATA:  Substance fell, left ankle pain and  deformity EXAM: LEFT ANKLE COMPLETE - 3+ VIEW COMPARISON:  03/08/2021 FINDINGS: Frontal, oblique, lateral views of the left ankle are obtained. There is an oblique lateral malleolar fracture, with dorsal and lateral displacement of the distal fracture fragment. Small fracture is seen off the anterior aspect of the tibial plafond. There is dorsal dislocation of the talus relative to the tibial plafond and. No other acute bony abnormalities. There is diffuse soft tissue swelling. IMPRESSION: 1. Fracture dislocation of the left ankle. Dorsal dislocation of the talus relative to the tibial plafond. Displaced fractures of the anterior lip of the tibial plafond and the lateral malleolus as above. Electronically Signed   By: Sharlet Salina M.D.   On: 01/13/2023 23:09    Procedures Reduction of dislocation  Date/Time: 01/13/2023 11:48 PM  Performed by: Antony Madura, PA-C Authorized by: Antony Madura, PA-C  Consent: The procedure was performed in an emergent situation. Verbal consent obtained. Risks and benefits: risks, benefits and alternatives were discussed Consent given by: patient Patient understanding: patient states understanding of the procedure being performed Patient consent: the patient's understanding of the procedure matches consent given Procedure consent: procedure consent matches procedure scheduled Relevant documents: relevant documents present and verified Test results: test results available and properly labeled Imaging studies: imaging studies available Required items: required blood products, implants, devices, and special equipment available Patient identity confirmed: verbally with patient and arm band Time out: Immediately prior to procedure a "time out" was called to verify the correct patient, procedure, equipment, support staff and site/side marked as required.  Sedation: Patient sedated: yes Sedation type: anxiolysis and moderate (conscious) sedation Sedatives:  ketamine Analgesia: hydromorphone Sedation start date/time: 01/13/2023 11:37 PM Sedation end date/time: 01/13/2023 11:49 PM Vitals: Vital signs were monitored during sedation.  Patient tolerance: patient tolerated the procedure well with no immediate complications Comments: See sedation note by MD for further detail.       Medications Ordered in ED Medications  ketamine 50 mg in normal saline 5 mL (10 mg/mL) syringe (79 mg Intravenous Given 01/13/23 2348)  ondansetron (ZOFRAN) injection 4 mg (4 mg Intravenous Given 01/13/23 2325)  HYDROmorphone (DILAUDID) injection 0.5 mg (0.5 mg Intravenous Given 01/13/23 2327)  oxyCODONE-acetaminophen (PERCOCET/ROXICET) 5-325 MG per tablet 2 tablet (2 tablets Oral Given 01/14/23 0028)    ED Course/ Medical Decision Making/ A&P Clinical Course as of 01/14/23 6962  Sat Jan 13, 2023  2300 Imaging viewed and interpreted; appears to have a tibiotalar dislocation as well as a displaced distal fibular fracture. Plan for  reduction and splinting. MD Horton aware. [KH]  2327 Spoke with Dr. August Saucer of Orthopedics who is in agreement with plan for reduction and splinting. Would like CT completed prior to discharge, following completion of reduction. Requests office f/u on Monday; patient can call Monday morning for appointment time. [KH]  2348 Successful joint reduction with Ketamine. Posterior stirrup splint applied. [KH]  Sun Jan 14, 2023  0008 Improved alignment noted on repeat Xray [KH]  0025 Patient back to baseline. Awake and conversant. All questions answered. [KH]    Clinical Course User Index [KH] Antony Madura, PA-C                             Medical Decision Making Amount and/or Complexity of Data Reviewed Radiology: ordered.  Risk Prescription drug management.   This patient presents to the ED for concern of L ankle pain, this involves an extensive number of treatment options, and is a complaint that carries with it a high risk of complications and  morbidity.  The differential diagnosis includes sprain/strain vs fx vs joint dislocation   Co morbidities that complicate the patient evaluation  HLD Thyroid disease GERD    Additional history obtained:  Additional history obtained from daughter, at bedside; EMS during transport   Imaging Studies ordered:  I ordered imaging studies including Xray L ankle  I independently visualized and interpreted imaging which showed bimalleolar fx with talotibial dislocation I agree with the radiologist interpretation   Cardiac Monitoring:  The patient was maintained on a cardiac monitor.  I personally viewed and interpreted the cardiac monitored which showed an underlying rhythm of: NSR   Medicines ordered and prescription drug management:  I ordered medication including Dilaudid for pain  Reevaluation of the patient after these medicines showed that the patient improved I have reviewed the patients home medicines and have made adjustments as needed   Critical Interventions:  Reduction of joint dislocation   Consultations Obtained:  I requested consultation with Dr. August Saucer of Orthopedics and discussed lab and imaging findings as well as pertinent plan - they recommend: close f/u in the office and CT in the ED prior to discharge   Problem List / ED Course:  As above   Reevaluation:  After the interventions noted above, I reevaluated the patient and found that they have :improved   Social Determinants of Health:  Necessitates use of assist device when ambulating   Dispostion:  After consideration of the diagnostic results and the patients response to treatment, I feel that the patent would benefit from outpatient Orthopedic evaluation. Advised RICE, instructed patient to maintain splint at all times. Return precautions discussed and provided. Patient discharged in stable condition with no unaddressed concerns.         Final Clinical Impression(s) / ED  Diagnoses Final diagnoses:  Closed bimalleolar fracture of left ankle, initial encounter    Rx / DC Orders ED Discharge Orders          Ordered    oxyCODONE-acetaminophen (PERCOCET/ROXICET) 5-325 MG tablet  Every 6 hours PRN        01/14/23 0022              Antony Madura, PA-C 01/14/23 2956    Shon Baton, MD 01/22/23 [phone]

## 2023-01-13 NOTE — Progress Notes (Signed)
Orthopedic Tech Progress Note Patient Details:  Destiny Graham [birth year] [id number]  Ortho Devices Type of Ortho Device: Cotton web roll, Ace wrap, Stirrup splint, Short leg splint Ortho Device/Splint Location: LLE Ortho Device/Splint Interventions: Ordered, Application, Adjustment   Post Interventions Patient Tolerated: Well Instructions Provided: Care of device  [name] 01/13/2023, 11:59 PM

## 2023-01-13 NOTE — ED Triage Notes (Signed)
Pt BIB [facility] from home. Pt c/o ankle injury. Pt was outside in yard feeding animals. Pt slipped on pile of leaves and fell to the ground. Pt denies hitting head. No LOC and no thinners. Obvious deformity to left ankle. C/o 6/10 pain when she moves. Refused pain management. Post motor sensory intact. Palpable pulse. Hx hypertension, hypothyroid, hyper cholesterol . 136/71BP 87HR 97o2 18RR 120cbg

## 2023-01-13 NOTE — Discharge Instructions (Signed)
Wear your splint at all times; do not remove it. Use an assist device when mobile to prevent from putting weight on your left foot/ankle. Follow up with Orthopedics on Monday. Take medications prescribed for pain. Do not drive or drink alcohol after taking this medication as it may make you drowsy and impair your judgement. Return for new or concerning symptoms.

## 2023-01-14 DIAGNOSIS — R600 Localized edema: Secondary | ICD-10-CM | POA: Diagnosis not present

## 2023-01-14 DIAGNOSIS — S82832A Other fracture of upper and lower end of left fibula, initial encounter for closed fracture: Secondary | ICD-10-CM | POA: Diagnosis not present

## 2023-01-14 MED ORDER — OXYCODONE-ACETAMINOPHEN 5-325 MG PO TABS
2.0000 | ORAL_TABLET | Freq: Once | ORAL | Status: AC
  Administered 2023-01-14: 2 via ORAL
  Filled 2023-01-14: qty 2

## 2023-01-14 MED ORDER — OXYCODONE-ACETAMINOPHEN 5-325 MG PO TABS: 1.0000 | ORAL_TABLET | Freq: Four times a day (QID) | ORAL | 0 refills | Status: DC | PRN

## 2023-01-15 ENCOUNTER — Other Ambulatory Visit: Payer: Self-pay

## 2023-01-15 ENCOUNTER — Encounter (HOSPITAL_COMMUNITY): Payer: Self-pay | Admitting: Orthopaedic Surgery

## 2023-01-15 ENCOUNTER — Other Ambulatory Visit: Payer: Self-pay | Admitting: Orthopaedic Surgery

## 2023-01-15 DIAGNOSIS — S82842A Displaced bimalleolar fracture of left lower leg, initial encounter for closed fracture: Secondary | ICD-10-CM | POA: Diagnosis not present

## 2023-01-15 NOTE — Progress Notes (Signed)
Dr. Rica Mast denies chest pain or shortness of breath. Patient denies having any s/s of Covid in her household, also denies any known exposure to Covid.  Destiny Graham any s/s of upper or lower respiratory in the past 8 weeks.  Destiny Seaberry's PCP is Christen Butter, NP

## 2023-01-16 ENCOUNTER — Ambulatory Visit (HOSPITAL_COMMUNITY): Payer: Commercial Managed Care - PPO

## 2023-01-16 ENCOUNTER — Encounter (HOSPITAL_COMMUNITY): Payer: Self-pay | Admitting: Orthopaedic Surgery

## 2023-01-16 ENCOUNTER — Ambulatory Visit (HOSPITAL_COMMUNITY)
Admission: RE | Admit: 2023-01-16 | Discharge: 2023-01-16 | Disposition: A | Discharge status: Home / Self Care | Payer: Commercial Managed Care - PPO | Attending: Orthopaedic Surgery | Admitting: Orthopaedic Surgery

## 2023-01-16 ENCOUNTER — Ambulatory Visit (HOSPITAL_BASED_OUTPATIENT_CLINIC_OR_DEPARTMENT_OTHER): Payer: Commercial Managed Care - PPO | Admitting: Certified Registered Nurse Anesthetist

## 2023-01-16 ENCOUNTER — Other Ambulatory Visit: Payer: Self-pay

## 2023-01-16 ENCOUNTER — Ambulatory Visit (HOSPITAL_COMMUNITY): Payer: Commercial Managed Care - PPO | Admitting: Certified Registered Nurse Anesthetist

## 2023-01-16 ENCOUNTER — Encounter (HOSPITAL_COMMUNITY)
Admission: RE | Disposition: A | Discharge status: Home / Self Care | Payer: Self-pay | Source: Home / Self Care | Attending: Orthopaedic Surgery

## 2023-01-16 DIAGNOSIS — E039 Hypothyroidism, unspecified: Secondary | ICD-10-CM

## 2023-01-16 DIAGNOSIS — S93432A Sprain of tibiofibular ligament of left ankle, initial encounter: Secondary | ICD-10-CM | POA: Diagnosis not present

## 2023-01-16 DIAGNOSIS — Z79899 Other long term (current) drug therapy: Secondary | ICD-10-CM | POA: Diagnosis not present

## 2023-01-16 DIAGNOSIS — K219 Gastro-esophageal reflux disease without esophagitis: Secondary | ICD-10-CM | POA: Diagnosis not present

## 2023-01-16 DIAGNOSIS — Y9301 Activity, walking, marching and hiking: Secondary | ICD-10-CM | POA: Diagnosis not present

## 2023-01-16 DIAGNOSIS — X58XXXA Exposure to other specified factors, initial encounter: Secondary | ICD-10-CM | POA: Insufficient documentation

## 2023-01-16 DIAGNOSIS — F418 Other specified anxiety disorders: Secondary | ICD-10-CM | POA: Diagnosis not present

## 2023-01-16 DIAGNOSIS — I10 Essential (primary) hypertension: Secondary | ICD-10-CM | POA: Insufficient documentation

## 2023-01-16 DIAGNOSIS — E785 Hyperlipidemia, unspecified: Secondary | ICD-10-CM | POA: Diagnosis not present

## 2023-01-16 DIAGNOSIS — S8292XA Unspecified fracture of left lower leg, initial encounter for closed fracture: Secondary | ICD-10-CM | POA: Diagnosis not present

## 2023-01-16 DIAGNOSIS — G4733 Obstructive sleep apnea (adult) (pediatric): Secondary | ICD-10-CM | POA: Diagnosis not present

## 2023-01-16 DIAGNOSIS — M1990 Unspecified osteoarthritis, unspecified site: Secondary | ICD-10-CM | POA: Diagnosis not present

## 2023-01-16 DIAGNOSIS — F32A Depression, unspecified: Secondary | ICD-10-CM | POA: Insufficient documentation

## 2023-01-16 DIAGNOSIS — S82842A Displaced bimalleolar fracture of left lower leg, initial encounter for closed fracture: Secondary | ICD-10-CM | POA: Insufficient documentation

## 2023-01-16 DIAGNOSIS — F419 Anxiety disorder, unspecified: Secondary | ICD-10-CM | POA: Diagnosis not present

## 2023-01-16 DIAGNOSIS — G8918 Other acute postprocedural pain: Secondary | ICD-10-CM | POA: Diagnosis not present

## 2023-01-16 HISTORY — PX: ORIF ANKLE FRACTURE: SHX5408

## 2023-01-16 LAB — CBC
HCT: 35.6 % — ABNORMAL LOW (ref 36.0–46.0)
Hemoglobin: 11.2 g/dL — ABNORMAL LOW (ref 12.0–15.0)
MCH: 30.7 pg (ref 26.0–34.0)
MCHC: 31.5 g/dL (ref 30.0–36.0)
MCV: 97.5 fL (ref 80.0–100.0)
Platelets: 227 10*3/uL (ref 150–400)
RBC: 3.65 MIL/uL — ABNORMAL LOW (ref 3.87–5.11)
RDW: 13.9 % (ref 11.5–15.5)
WBC: 4.3 10*3/uL (ref 4.0–10.5)
nRBC: 0 % (ref 0.0–0.2)

## 2023-01-16 LAB — SURGICAL PCR SCREEN
MRSA, PCR: NEGATIVE
Staphylococcus aureus: NEGATIVE

## 2023-01-16 LAB — BASIC METABOLIC PANEL
Anion gap: 10 (ref 5–15)
BUN: 9 mg/dL (ref 8–23)
CO2: 24 mmol/L (ref 22–32)
Calcium: 8 mg/dL — ABNORMAL LOW (ref 8.9–10.3)
Chloride: 102 mmol/L (ref 98–111)
Creatinine, Ser: 0.72 mg/dL (ref 0.44–1.00)
GFR, Estimated: >60 mL/min (ref >60)
Glucose, Bld: 105 mg/dL — ABNORMAL HIGH (ref 70–99)
Potassium: 3.7 mmol/L (ref 3.5–5.1)
Sodium: 136 mmol/L (ref 135–145)

## 2023-01-16 SURGERY — OPEN REDUCTION INTERNAL FIXATION (ORIF) ANKLE FRACTURE
Anesthesia: General | Site: Ankle | Laterality: Left

## 2023-01-16 MED ORDER — AMISULPRIDE (ANTIEMETIC) 5 MG/2ML IV SOLN: 10.0000 mg | Freq: Once | INTRAVENOUS | Status: DC | PRN

## 2023-01-16 MED ORDER — ONDANSETRON HCL 4 MG/2ML IJ SOLN
INTRAMUSCULAR | Status: DC | PRN
  Administered 2023-01-16: 4 mg via INTRAVENOUS

## 2023-01-16 MED ORDER — PHENYLEPHRINE HCL-NACL 20-0.9 MG/250ML-% IV SOLN
INTRAVENOUS | Status: DC | PRN
  Administered 2023-01-16: 40 ug/min via INTRAVENOUS

## 2023-01-16 MED ORDER — VANCOMYCIN HCL 1000 MG IV SOLR
INTRAVENOUS | Status: AC
  Filled 2023-01-16: qty 20

## 2023-01-16 MED ORDER — PROMETHAZINE HCL 25 MG/ML IJ SOLN: 6.2500 mg | INTRAMUSCULAR | Status: DC | PRN

## 2023-01-16 MED ORDER — OXYCODONE HCL 5 MG/5ML PO SOLN: 5.0000 mg | Freq: Once | ORAL | Status: DC | PRN

## 2023-01-16 MED ORDER — PHENYLEPHRINE 80 MCG/ML (10ML) SYRINGE FOR IV PUSH (FOR BLOOD PRESSURE SUPPORT)
PREFILLED_SYRINGE | INTRAVENOUS | Status: DC | PRN
  Administered 2023-01-16 (×2): 160 ug via INTRAVENOUS

## 2023-01-16 MED ORDER — EPHEDRINE SULFATE-NACL 50-0.9 MG/10ML-% IV SOSY
PREFILLED_SYRINGE | INTRAVENOUS | Status: DC | PRN
  Administered 2023-01-16 (×2): 10 mg via INTRAVENOUS

## 2023-01-16 MED ORDER — CEFAZOLIN SODIUM-DEXTROSE 2-4 GM/100ML-% IV SOLN
INTRAVENOUS | Status: AC
  Filled 2023-01-16: qty 100

## 2023-01-16 MED ORDER — MEPERIDINE HCL 25 MG/ML IJ SOLN: 6.2500 mg | INTRAMUSCULAR | Status: DC | PRN

## 2023-01-16 MED ORDER — CEFAZOLIN SODIUM-DEXTROSE 2-4 GM/100ML-% IV SOLN
2.0000 g | INTRAVENOUS | Status: AC
  Administered 2023-01-16: 2 g via INTRAVENOUS

## 2023-01-16 MED ORDER — LIDOCAINE 2% (20 MG/ML) 5 ML SYRINGE
INTRAMUSCULAR | Status: AC
  Filled 2023-01-16: qty 5

## 2023-01-16 MED ORDER — FENTANYL CITRATE (PF) 250 MCG/5ML IJ SOLN
INTRAMUSCULAR | Status: DC | PRN
  Administered 2023-01-16: 50 ug via INTRAVENOUS

## 2023-01-16 MED ORDER — PROPOFOL 10 MG/ML IV BOLUS
INTRAVENOUS | Status: DC | PRN
  Administered 2023-01-16: 200 mg via INTRAVENOUS

## 2023-01-16 MED ORDER — LACTATED RINGERS IV SOLN: INTRAVENOUS | Status: DC

## 2023-01-16 MED ORDER — FENTANYL CITRATE (PF) 100 MCG/2ML IJ SOLN
INTRAMUSCULAR | Status: AC
  Administered 2023-01-16: 100 ug via INTRAVENOUS
  Filled 2023-01-16: qty 2

## 2023-01-16 MED ORDER — ROPIVACAINE HCL 5 MG/ML IJ SOLN
INTRAMUSCULAR | Status: DC | PRN
  Administered 2023-01-16: 50 mL via PERINEURAL

## 2023-01-16 MED ORDER — OXYCODONE HCL 5 MG PO TABS: 5.0000 mg | ORAL_TABLET | Freq: Once | ORAL | Status: DC | PRN

## 2023-01-16 MED ORDER — HYDROMORPHONE HCL 1 MG/ML IJ SOLN: 0.2500 mg | INTRAMUSCULAR | Status: DC | PRN

## 2023-01-16 MED ORDER — DEXAMETHASONE SODIUM PHOSPHATE 10 MG/ML IJ SOLN
INTRAMUSCULAR | Status: DC | PRN
  Administered 2023-01-16: 10 mg via INTRAVENOUS

## 2023-01-16 MED ORDER — LIDOCAINE 2% (20 MG/ML) 5 ML SYRINGE
INTRAMUSCULAR | Status: DC | PRN
  Administered 2023-01-16: 20 mg via INTRAVENOUS

## 2023-01-16 MED ORDER — FENTANYL CITRATE (PF) 250 MCG/5ML IJ SOLN
INTRAMUSCULAR | Status: AC
  Filled 2023-01-16: qty 5

## 2023-01-16 MED ORDER — CHLORHEXIDINE GLUCONATE 0.12 % MT SOLN: 15.0000 mL | Freq: Once | OROMUCOSAL | Status: AC

## 2023-01-16 MED ORDER — OXYCODONE-ACETAMINOPHEN 5-325 MG PO TABS: 1.0000 | ORAL_TABLET | Freq: Four times a day (QID) | ORAL | 0 refills | Status: DC | PRN

## 2023-01-16 MED ORDER — CHLORHEXIDINE GLUCONATE 0.12 % MT SOLN
OROMUCOSAL | Status: AC
  Administered 2023-01-16: 15 mL via OROMUCOSAL
  Filled 2023-01-16: qty 15

## 2023-01-16 MED ORDER — POVIDONE-IODINE 10 % EX SWAB
2.0000 | Freq: Once | CUTANEOUS | Status: AC
  Administered 2023-01-16: 2 via TOPICAL

## 2023-01-16 MED ORDER — MIDAZOLAM HCL 2 MG/2ML IJ SOLN
INTRAMUSCULAR | Status: AC
  Filled 2023-01-16: qty 2

## 2023-01-16 MED ORDER — 0.9 % SODIUM CHLORIDE (POUR BTL) OPTIME
TOPICAL | Status: DC | PRN
  Administered 2023-01-16: 1000 mL

## 2023-01-16 MED ORDER — MIDAZOLAM HCL 2 MG/2ML IJ SOLN: 2.0000 mg | Freq: Once | INTRAMUSCULAR | Status: AC

## 2023-01-16 MED ORDER — ASPIRIN 325 MG PO TABS: ORAL_TABLET | ORAL | 0 refills | Status: DC

## 2023-01-16 MED ORDER — ORAL CARE MOUTH RINSE: 15.0000 mL | Freq: Once | OROMUCOSAL | Status: AC

## 2023-01-16 MED ORDER — PROPOFOL 10 MG/ML IV BOLUS
INTRAVENOUS | Status: AC
  Filled 2023-01-16: qty 20

## 2023-01-16 MED ORDER — FENTANYL CITRATE (PF) 100 MCG/2ML IJ SOLN: 100.0000 ug | Freq: Once | INTRAMUSCULAR | Status: AC

## 2023-01-16 MED ORDER — MIDAZOLAM HCL 2 MG/2ML IJ SOLN
INTRAMUSCULAR | Status: AC
  Administered 2023-01-16: 2 mg via INTRAVENOUS
  Filled 2023-01-16: qty 2

## 2023-01-16 SURGICAL SUPPLY — 63 items
ALCOHOL 70% 16 OZ (MISCELLANEOUS) ×1 IMPLANT
BAG COUNTER SPONGE SURGICOUNT (BAG) ×1 IMPLANT
BANDAGE ESMARK 6X9 LF (GAUZE/BANDAGES/DRESSINGS) IMPLANT
BIT DRILL 2 CANN GRADUATED (BIT) IMPLANT
BIT DRILL 2.5 CANN LNG (BIT) IMPLANT
BIT DRILL 2.5 CANN STRL (BIT) IMPLANT
BIT DRILL 3 CANN ENDOSCOPIC (BIT) IMPLANT
BLADE SURG 15 STRL LF DISP TIS (BLADE) ×1 IMPLANT
BLADE SURG 15 STRL SS (BLADE) ×1
BNDG COHESIVE 4X5 TAN STRL (GAUZE/BANDAGES/DRESSINGS) IMPLANT
BNDG COHESIVE 6X5 TAN ST LF (GAUZE/BANDAGES/DRESSINGS) IMPLANT
BNDG ELASTIC 6X10 VLCR STRL LF (GAUZE/BANDAGES/DRESSINGS) ×1 IMPLANT
BNDG ELASTIC 6X15 VLCR STRL LF (GAUZE/BANDAGES/DRESSINGS) IMPLANT
BNDG ESMARK 6X9 LF (GAUZE/BANDAGES/DRESSINGS)
CANISTER SUCT 3000ML PPV (MISCELLANEOUS) ×1 IMPLANT
CHLORAPREP W/TINT 26 (MISCELLANEOUS) ×2 IMPLANT
COVER SURGICAL LIGHT HANDLE (MISCELLANEOUS) ×1 IMPLANT
CUFF TOURN SGL QUICK 34 (TOURNIQUET CUFF) ×1
CUFF TOURN SGL QUICK 42 (TOURNIQUET CUFF) IMPLANT
CUFF TRNQT CYL 34X4.125X (TOURNIQUET CUFF) ×1 IMPLANT
DRAPE OEC MINIVIEW 54X84 (DRAPES) ×1 IMPLANT
DRAPE U-SHAPE 47X51 STRL (DRAPES) ×1 IMPLANT
DRSG MEPITEL 4X7.2 (GAUZE/BANDAGES/DRESSINGS) ×1 IMPLANT
DRSG XEROFORM 1X8 (GAUZE/BANDAGES/DRESSINGS) ×1 IMPLANT
ELECT REM PT RETURN 9FT ADLT (ELECTROSURGICAL) ×1
ELECTRODE REM PT RTRN 9FT ADLT (ELECTROSURGICAL) ×1 IMPLANT
GAUZE PAD ABD 8X10 STRL (GAUZE/BANDAGES/DRESSINGS) ×2 IMPLANT
GAUZE SPONGE 4X4 12PLY STRL (GAUZE/BANDAGES/DRESSINGS) IMPLANT
GAUZE SPONGE 4X4 12PLY STRL LF (GAUZE/BANDAGES/DRESSINGS) ×1 IMPLANT
GLOVE BIOGEL M STRL SZ7.5 (GLOVE) ×1 IMPLANT
GLOVE BIOGEL PI IND STRL 8 (GLOVE) ×1 IMPLANT
GLOVE SRG 8 PF TXTR STRL LF DI (GLOVE) ×1 IMPLANT
GLOVE SURG ENC TEXT LTX SZ7.5 (GLOVE) ×1 IMPLANT
GLOVE SURG UNDER POLY LF SZ8 (GLOVE) ×1
GOWN STRL REUS W/ TWL LRG LVL3 (GOWN DISPOSABLE) ×1 IMPLANT
GOWN STRL REUS W/ TWL XL LVL3 (GOWN DISPOSABLE) ×2 IMPLANT
GOWN STRL REUS W/TWL LRG LVL3 (GOWN DISPOSABLE) ×1
GOWN STRL REUS W/TWL XL LVL3 (GOWN DISPOSABLE) ×2
KIT BASIN OR (CUSTOM PROCEDURE TRAY) ×1 IMPLANT
KIT TURNOVER KIT B (KITS) ×1 IMPLANT
NS IRRIG 1000ML POUR BTL (IV SOLUTION) ×1 IMPLANT
PACK ORTHO EXTREMITY (CUSTOM PROCEDURE TRAY) ×1 IMPLANT
PAD ARMBOARD 7.5X6 YLW CONV (MISCELLANEOUS) ×2 IMPLANT
PAD CAST 4YDX4 CTTN HI CHSV (CAST SUPPLIES) ×1 IMPLANT
PADDING CAST COTTON 4X4 STRL (CAST SUPPLIES) ×1
PADDING CAST SYNTHETIC 4X4 STR (CAST SUPPLIES) IMPLANT
PADDING CAST SYNTHETIC 6X4 NS (CAST SUPPLIES) IMPLANT
PLATE LOCK DIST FIB 5H TTNIUM (Plate) IMPLANT
SCREW CANC TI FT ANKLE 4X14 (Screw) IMPLANT
SCREW LOCK COMP 3X16 (Screw) IMPLANT
SCREW LP CORT 3.0X20 (Screw) IMPLANT
SCREW LP TI 3.5X14MM (Screw) IMPLANT
SCREW VAL KREULOCK 3.0X14 TI (Screw) IMPLANT
SPONGE T-LAP 18X18 ~~LOC~~+RFID (SPONGE) ×1 IMPLANT
SUCTION FRAZIER HANDLE 10FR (MISCELLANEOUS) ×1
SUCTION TUBE FRAZIER 10FR DISP (MISCELLANEOUS) ×1 IMPLANT
SUT ETHILON 3 0 PS 1 (SUTURE) ×1 IMPLANT
SUT MNCRL AB 3-0 PS2 27 (SUTURE) ×1 IMPLANT
SUT VIC AB 2-0 CT1 27 (SUTURE) ×2
SUT VIC AB 2-0 CT1 TAPERPNT 27 (SUTURE) ×2 IMPLANT
TOWEL GREEN STERILE (TOWEL DISPOSABLE) ×1 IMPLANT
TOWEL GREEN STERILE FF (TOWEL DISPOSABLE) ×1 IMPLANT
TUBE CONNECTING 12X1/4 (SUCTIONS) ×1 IMPLANT

## 2023-01-16 NOTE — H&P (Signed)
PREOPERATIVE H&P  Chief Complaint: left ankle pain  HPI: Destiny Graham is a 67 y.o. female who presents for preoperative history and physical with a diagnosis of left bimalleolar ankle fracture with syndesmosis disruption.  She had a fracture dislocation while walking in the yard.  She had closed reduction in the emergency department and splinting.  She is here today for surgery.  She has been maintaining nonweightbearing. Symptoms are rated as moderate to severe, and have been worsening.  This is significantly impairing activities of daily living.  She has elected for surgical management.   Past Medical History:  Diagnosis Date   Anxiety    Phreesia 07/27/2020   Arthritis    Phreesia 07/27/2020   Breast nodule    Cataract    Phreesia 07/27/2020   DDD (degenerative disc disease), cervical    Dysthymia    Generalized OA    GERD (gastroesophageal reflux disease)    Hallux rigidus    Heart murmur    beneign   Hyperlipidemia    Hypertension    OSA on CPAP    Does not use a cpap   Pseudophakia    Sleep apnea    Phreesia 07/27/2020   Thyroid disease    Uterine fibroid    Past Surgical History:  Procedure Laterality Date   BUNIONECTOMY     CATARACT EXTRACTION  2016   EYE SURGERY     FRACTURE SURGERY N/A    Phreesia 07/27/2020   fusion   2015   first MTP B   HARDWARE REMOVAL Left 06/15/2017   Procedure: Left Great Toe Hardware Removal;  Surgeon: Nadara Mustard, MD;  Location: [facility] OR;  Service: Orthopedics;  Laterality: Left;   LAPAROSCOPIC APPENDECTOMY N/A 02/01/2022   Procedure: APPENDECTOMY LAPAROSCOPIC;  Surgeon: Berna Bue, MD;  Location: [facility] OR;  Service: General;  Laterality: N/A;   MYOMECTOMY  1992   ORIF FINGER FRACTURE  2006   PAROTIDECTOMY Right 05/14/2020   Procedure: RIGHT SUPERFICIAL PAROTIDECTOMY;  Surgeon: Osborn Coho, MD;  Location: [facility] OR;  Service: ENT;  Laterality: Right;  REQUESTING RNFA   THYROIDECTOMY  2005   TOENAIL EXCISION Right 06/15/2017    Procedure: Right Great Toe Excision Lateral Border Nail;  Surgeon: Nadara Mustard, MD;  Location: [facility] OR;  Service: Orthopedics;  Laterality: Right;   TONSILLECTOMY  1960   Social History   Socioeconomic History   Marital status: Divorced    Spouse name: Not on file   Number of children: Not on file   Years of education: Not on file   Highest education level: Not on file  Occupational History   Not on file  Tobacco Use   Smoking status: Never   Smokeless tobacco: Never  Vaping Use   Vaping Use: Never used  Substance and Sexual Activity   Alcohol use: Yes    Alcohol/week: 6.0 standard drinks of alcohol    Types: 6 Glasses of wine per week   Drug use: Never   Sexual activity: Not Currently  Other Topics Concern   Not on file  Social History Narrative   Not on file   Social Determinants of Health   Financial Resource Strain: Not on file  Food Insecurity: Not on file  Transportation Needs: Not on file  Physical Activity: Not on file  Stress: Not on file  Social Connections: Not on file   Family History  Problem Relation Age of Onset   Arthritis Mother    Depression Mother  COPD Father    Hearing loss Father    Heart disease Father    Hyperlipidemia Father    Hypertension Father    Kidney disease Father    Alcohol abuse Father    Depression Sister    Alcohol abuse Sister    Heart disease Maternal Grandmother        CHF   COPD Maternal Grandfather    Alcohol abuse Paternal Grandmother    Depression Sister    Heart disease Sister    Hyperlipidemia Sister    Depression Sister    Heart disease Sister    Hyperlipidemia Sister    Allergies  Allergen Reactions   Sulfamethoxazole-Trimethoprim Other (See Comments)    [name] Rash   Shingrix [Zoster Vac Recomb Adjuvanted] Other (See Comments)    Localized reaction with fever, hypotension, vertigo   Vioxx [Rofecoxib] Other (See Comments)    Photodermatitis    Prior to Admission medications    Medication Sig Start Date End Date Taking? Authorizing Provider  acetaminophen (TYLENOL) 500 MG tablet Take 1,000 mg by mouth every 6 (six) hours as needed for mild pain or headache.    Yes [provider]  atenolol (TENORMIN) 50 MG tablet Take 1 tablet (50 mg total) by mouth daily. 06/12/22  Yes Christen Butter, NP  DULoxetine (CYMBALTA) 60 MG capsule Take 1 capsule by mouth daily. 11/29/21  Yes [provider]  estradiol (ESTRACE VAGINAL) 0.1 MG/GM vaginal cream Place 1 Applicatorful vaginally at bedtime. Patient taking differently: Place 1 Applicatorful vaginally at bedtime. 3 times a week 06/20/22  Yes Jessup, Joy, NP  levothyroxine (SYNTHROID) 150 MCG tablet Take 1 tablet (150 mcg total) by mouth daily. 12/18/22  Yes Christen Butter, NP  naproxen sodium (ALEVE) 220 MG tablet Take 440 mg by mouth 2 (two) times daily as needed (arthritis pain).   Yes [provider]  omeprazole (PRILOSEC) 20 MG capsule Take 1 capsule (20 mg total) by mouth daily. 08/22/22  Yes Christen Butter, NP  oxyCODONE-acetaminophen (PERCOCET/ROXICET) 5-325 MG tablet Take 1-2 tablets by mouth every 6 (six) hours as needed for severe pain. 01/14/23  Yes Horton, Mayer Masker, MD  Propylene Glycol (SYSTANE COMPLETE OP) Place 1 drop into both eyes daily as needed (irritation).   Yes [provider]  rosuvastatin (CRESTOR) 20 MG tablet Take 1 tablet (20 mg total) by mouth daily. 09/08/22 09/08/23 Yes Christen Butter, NP  Tretinoin (RETIN-A EX) Apply 1 application. topically 2 (two) times a week.   Yes [provider]  meloxicam (MOBIC) 15 MG tablet Take 1 tablet (15 mg total) by mouth daily. 06/20/22   Christen Butter, NP  nystatin (MYCOSTATIN/NYSTOP) powder Apply to affected areas 2 times daily 03/15/22     predniSONE (DELTASONE) 50 MG tablet Take 1 tablet (50 mg total) by mouth daily with breakfast. 08/04/22   Wallis Bamberg, PA-C     Positive ROS: All other systems have been reviewed and were otherwise negative  with the exception of those mentioned in the HPI and as above.  Physical Exam:  Vitals:   01/16/23 0910  BP: (!) 158/74  Pulse: 67  Resp: 17  Temp: 98.4 F (36.9 C)  SpO2: 94%   General: Alert, no acute distress Cardiovascular: No pedal edema Respiratory: No cyanosis, no use of accessory musculature GI: No organomegaly, abdomen is soft and non-tender Skin: No lesions in the area of chief complaint Neurologic: Sensation intact distally Psychiatric: Patient is competent for consent with normal mood and affect Lymphatic:  No axillary or cervical lymphadenopathy  MUSCULOSKELETAL: Left ankle in a short leg splint.  Exposed forefoot is warm and well-perfused.  No tenderness about the forefoot.  Alignment in the splint is acceptable.  No tenderness proximal to the splint.  Silt about the toes.  Assessment: Left bimalleolar ankle fracture with syndesmosis disruption   Plan: Plan for open treatment of her ankle fracture and syndesmosis.  We discussed tight rope fixation of the syndesmosis.  She may require deltoid ligament reconstruction but I am hopeful that we can avoid that.  She will be discharged from the PACU..  We discussed the risks, benefits and alternatives of surgery which include but are not limited to wound healing complications, infection, nonunion, malunion, need for further surgery, damage to surrounding structures and continued pain.  They understand there is no guarantees to an acceptable outcome.  After weighing these risks they opted to proceed with surgery.     Terance Hart, MD    01/16/2023 10:02 AM

## 2023-01-16 NOTE — Anesthesia Procedure Notes (Signed)
Procedure Name: LMA Insertion Date/Time: 01/16/2023 11:20 AM  Performed by: Nadara Mustard, CRNAPre-anesthesia Checklist: Patient identified, Emergency Drugs available, Suction available and Patient being monitored Patient Re-evaluated:Patient Re-evaluated prior to induction Oxygen Delivery Method: Circle system utilized Preoxygenation: Pre-oxygenation with 100% oxygen Induction Type: IV induction Ventilation: Mask ventilation without difficulty LMA: LMA inserted LMA Size: 4.0 Tube type: Oral Number of attempts: 1 Airway Equipment and Method: Stylet and Oral airway Placement Confirmation: ETT inserted through vocal cords under direct vision, positive ETCO2 and breath sounds checked- equal and bilateral Tube secured with: Tape Dental Injury: Teeth and Oropharynx as per pre-operative assessment

## 2023-01-16 NOTE — Anesthesia Procedure Notes (Signed)
Anesthesia Regional Block: Popliteal block   Pre-Anesthetic Checklist: , timeout performed,  Correct Patient, Correct Site, Correct Laterality,  Correct Procedure, Correct Position, site marked,  Risks and benefits discussed,  Surgical consent,  Pre-op evaluation,  At surgeon's request and post-op pain management  Laterality: Left  Prep: chloraprep       Needles:  Injection technique: Single-shot  Needle Type: Stimiplex     Needle Length: 9cm  Needle Gauge: 21     Additional Needles:   Procedures:,,,, ultrasound used (permanent image in chart),,    Narrative:  Start time: 01/16/2023 10:41 AM End time: 01/16/2023 10:46 AM Injection made incrementally with aspirations every 5 mL.  Performed by: Personally  Anesthesiologist: Lowella Curb, MD

## 2023-01-16 NOTE — Anesthesia Postprocedure Evaluation (Signed)
Anesthesia Post Note  Patient: Destiny Graham  Procedure(s) Performed: OPEN REDUCTION INTERNAL FIXATION (ORIF) LEFT ANKLE FRACTURE AND SYNDESMOSIS (Left: Ankle)     Patient location during evaluation: PACU Anesthesia Type: General Level of consciousness: awake and alert Pain management: pain level controlled Vital Signs Assessment: post-procedure vital signs reviewed and stable Respiratory status: spontaneous breathing, nonlabored ventilation and respiratory function stable Cardiovascular status: blood pressure returned to baseline and stable Postop Assessment: no apparent nausea or vomiting Anesthetic complications: no   No notable events documented.  Last Vitals:  Vitals:   01/16/23 1300 01/16/23 1315  BP: 115/60 (!) 109/51  Pulse: 79 79  Resp: 12 16  Temp:  36.6 C  SpO2: 99% 94%    Last Pain:  Vitals:   01/16/23 1315  TempSrc:   PainSc: 0-No pain                 Lowella Curb

## 2023-01-16 NOTE — Transfer of Care (Signed)
Immediate Anesthesia Transfer of Care Note  Patient: Destiny Graham  Procedure(s) Performed: OPEN REDUCTION INTERNAL FIXATION (ORIF) LEFT ANKLE FRACTURE AND SYNDESMOSIS (Left: Ankle)  Patient Location: PACU  Anesthesia Type:General and GA combined with regional for post-op pain  Level of Consciousness: awake and alert   Airway & Oxygen Therapy: Patient Spontanous Breathing and Patient connected to nasal cannula oxygen  Post-op Assessment: Report given to RN and Post -op Vital signs reviewed and stable  Post vital signs: Reviewed and stable  Last Vitals:  Vitals Value Taken Time  BP    Temp    Pulse    Resp    SpO2      Last Pain:  Vitals:   01/16/23 0926  TempSrc:   PainSc: 2       Patients Stated Pain Goal: 2 (01/16/23 0926)  Complications: No notable events documented.

## 2023-01-16 NOTE — Discharge Instructions (Signed)

## 2023-01-16 NOTE — Anesthesia Procedure Notes (Signed)
Anesthesia Regional Block: Adductor canal block   Pre-Anesthetic Checklist: , timeout performed,  Correct Patient, Correct Site, Correct Laterality,  Correct Procedure, Correct Position, site marked,  Risks and benefits discussed,  Surgical consent,  Pre-op evaluation,  At surgeon's request and post-op pain management  Laterality: Left  Prep: chloraprep       Needles:  Injection technique: Single-shot  Needle Type: Stimiplex     Needle Length: 9cm  Needle Gauge: 21     Additional Needles:   Procedures:,,,, ultrasound used (permanent image in chart),,    Narrative:  Start time: 01/16/2023 10:40 AM End time: 01/16/2023 10:45 AM Injection made incrementally with aspirations every 5 mL.  Performed by: Personally  Anesthesiologist: Lowella Curb, MD

## 2023-01-16 NOTE — Anesthesia Preprocedure Evaluation (Signed)
Anesthesia Evaluation  Patient identified by MRN, date of birth, ID band Patient awake    Reviewed: Allergy & Precautions, NPO status , Patient's Chart, lab work & pertinent test results  Airway Mallampati: II  TM Distance: >3 FB Neck ROM: Full    Dental no notable dental hx.    Pulmonary sleep apnea and Continuous Positive Airway Pressure Ventilation    Pulmonary exam normal        Cardiovascular hypertension, Pt. on home beta blockers Normal cardiovascular exam     Neuro/Psych  PSYCHIATRIC DISORDERS Anxiety Depression    negative neurological ROS     GI/Hepatic Neg liver ROS,GERD  Medicated and Controlled,,  Endo/Other  Hypothyroidism    Renal/GU negative Renal ROS     Musculoskeletal  (+) Arthritis , Osteoarthritis,    Abdominal   Peds  Hematology negative hematology ROS (+)   Anesthesia Other Findings appendicitis  Reproductive/Obstetrics                             Anesthesia Physical Anesthesia Plan  ASA: 2  Anesthesia Plan: General   Post-op Pain Management: Regional block* and Minimal or no pain anticipated   Induction: Intravenous  PONV Risk Score and Plan: 3 and Ondansetron, Dexamethasone, Midazolam and Treatment may vary due to age or medical condition  Airway Management Planned: Oral ETT and LMA  Additional Equipment:   Intra-op Plan:   Post-operative Plan: Extubation in OR  Informed Consent: I have reviewed the patients History and Physical, chart, labs and discussed the procedure including the risks, benefits and alternatives for the proposed anesthesia with the patient or authorized representative who has indicated his/her understanding and acceptance.     Dental advisory given  Plan Discussed with: CRNA  Anesthesia Plan Comments:         Anesthesia Quick Evaluation

## 2023-01-17 ENCOUNTER — Encounter (HOSPITAL_COMMUNITY): Payer: Self-pay | Admitting: Orthopaedic Surgery

## 2023-01-17 NOTE — Op Note (Signed)
Destiny Graham female 67 y.o. 01/17/2023  PreOperative Diagnosis: Left bimalleolar ankle fracture Syndesmosis disruption  PostOperative Diagnosis: same  PROCEDURE: Open treatment left bimalleolar ankle fracture Open treatment of syndesmosis Ankle stress view fluoroscopy  SURGEON: Dub Mikes, MD  ASSISTANT: Jesse [place], PA-C was necessary for patient positioning, prep, drape, systems with fracture reduction, placement of hardware and closure  ANESTHESIA: General LMA with peripheral nerve blockade  FINDINGS: See below  IMPLANTS: Arthrex distal fibular locking plate, tight rope  INDICATIONS:67 y.o. femalesustained the above injury after slipping at home outside.  She had an ankle fracture dislocation underwent closed reduction in the emergency department.  She was splinted and sent here for evaluation.  Patient had a displaced lateral malleolus fracture and comminuted posterior malleolus fracture.  She had medial clear space widening and concern for syndesmosis disruption.  Given these findings she was indicated for surgery.   Patient understood the risks, benefits and alternatives to surgery which include but are not limited to wound healing complications, infection, nonunion, malunion, need for further surgery as well as damage to surrounding structures. They also understood the potential for continued pain in that there were no guarantees of acceptable outcome After weighing these risks the patient opted to proceed with surgery.  PROCEDURE:  Patient was identified the preoperative holding area.  The left leg was marked myself.  Consent was signed myself and the patient.  Peripheral nerve block was performed by anesthesia.  Patient was taken to the operative suite and placed supine on the operative table.  General anesthesia was induced out difficulty.  Preoperative antibiotics were given.  Thigh tourniquet was placed on the operative thigh and a bump was placed under the hip.  Bone foam was used.  All bony prominences well-padded.  Surgical timeout was performed.  The operative extremity was then elevated and the tourniquet inflated to 250 mmHg.    ORIF Lateral malleolus: We began by making a longitudinal incision overlying the distal fibula.  This was taken sharply down through skin and subcutaneous tissue.  Blunt dissection was used to identify any branch of the superficial peroneal nerve which was identified and protected through the entirety of the case.  The incision was then taken sharply down to bone and the fracture site was identified.  The fracture site was mobilized.   The fracture site were cleaned with a rondure and curette of any fracture hematoma and callus formation. We inspected the lateral aspect of the ankle joint.  No obvious cartilage wear on the talus.  The ankle joint was irrigated with normal saline to remove hematoma and any free-floating bony fragments. Then the fracture of the fibula was reduced under direct visualization and held provisionally with a lobster claw.  Then fluoroscopy confirmed adequate reduction of the fibula at that time.  Then a combination of locking and nonlocking screws were used after placement of a lag screw across the fracture by technique.  This provided good stability of the distal fibula fracture.    The posterior malleolus fragment was inspected fluoroscopically and found to be adequately reduced and not amenable to internal fixation.  Ankle stress view flouroscopy: Then under fluoroscopy the ankle was stressed using an anterior drawer, dorsiflex and external rotation moment and found to be unstable with regard to medial clear space and syndesmotic widening.   Then we proceeded to further dissect anterior to the fibula at the level of the syndesmosis.  There was obvious disruption of the syndesmotic ligament and on stability of the syndesmosis.  The syndesmosis was reduced under direct visualization and held provisionally  with a Weber clamp.  In a tight rope was placed across the syndesmosis in standard fashion.  It was tensioned.  The clamp was released and the ankle was re-stressed.  It was found to be stable.  Final fluoroscopic images were obtained.   The wounds were irrigated and the deep tissue was closed with a 3-0 nmonocryl  The subcuticular tissue was closed with 3-0 Monocryl and the skin with nylon.  Xeroform placed on the wounds as well as 4 x 4's and sterile she cotton.  She tolerated this well.  There were no complications.  She was awakened from anesthesia and taken recovery in stable condition.  POST OPERATIVE INSTRUCTIONS: Nonweightbearing on operative extremity Keep splint dry and limb elevated Call the office with concerns Follow-up in 2 weeks for splint removal, x-rays of the operative ankle, nonweightbearing and suture removal if appropriate.     TOURNIQUET TIME:less than one hour  BLOOD LOSS:  Minimal         DRAINS: none         SPECIMEN: none       COMPLICATIONS:  * No complications entered in OR log *         Disposition: PACU - hemodynamically stable.         Condition: stable

## 2023-01-22 NOTE — ED Provider Notes (Signed)
Physical Exam  BP 134/79 (BP Location: Right Arm)   Pulse 82   Temp 98.6 F (37 C) (Oral)   Resp 16   Ht 1.702 m (5\' 7" )   Wt 79.4 kg   SpO2 97%   BMI 27.41 kg/m    Procedures  .Sedation  Date/Time: 01/22/2023 6:38 AM  Performed by: Shon Baton, MD Authorized by: Shon Baton, MD   Consent:    Consent obtained:  Written   Consent given by:  Patient   Risks discussed:  Prolonged hypoxia resulting in organ damage, prolonged sedation necessitating reversal, vomiting, respiratory compromise necessitating ventilatory assistance and intubation, nausea and inadequate sedation   Alternatives discussed:  Analgesia without sedation and anxiolysis Universal protocol:    Immediately prior to procedure, a time out was called: yes   Indications:    Procedure performed:  Dislocation reduction   Procedure necessitating sedation performed by:  Different physician Pre-sedation assessment:    Time since last food or drink:  Unknown   NPO status caution: unable to specify NPO status     ASA classification: class 1 - normal, healthy patient     Mouth opening:  3 or more finger widths   Thyromental distance:  4 finger widths   Mallampati score:  I - soft palate, uvula, fauces, pillars visible   Neck mobility: normal     Pre-sedation assessments completed and reviewed: airway patency, cardiovascular function, hydration status, mental status, pain level, respiratory function and temperature   Immediate pre-procedure details:    Reassessment: Patient reassessed immediately prior to procedure     Reviewed: vital signs     Verified: bag valve mask available, emergency equipment available, intubation equipment available and IV patency confirmed   Procedure details (see MAR for exact dosages):    Preoxygenation:  Nasal cannula   Sedation:  Ketamine   Intended level of sedation: deep   Analgesia:  Hydromorphone   Intra-procedure monitoring:  Blood pressure monitoring, continuous  capnometry, cardiac monitor, continuous pulse oximetry, frequent LOC assessments and frequent vital sign checks   Intra-procedure events: none     Total Provider sedation time (minutes):  15 Post-procedure details:    Attendance: Constant attendance by certified staff until patient recovered     Recovery: Patient returned to pre-procedure baseline     Post-sedation assessments completed and reviewed: airway patency, cardiovascular function, hydration status, mental status, nausea/vomiting and pain level     Patient is stable for discharge or admission: yes     ED Course / MDM   Clinical Course as of 01/22/23 0637  Sat Jan 13, 2023  2300 Imaging viewed and interpreted; appears to have a tibiotalar dislocation as well as a displaced distal fibular fracture. Plan for reduction and splinting. MD Darl Kuss aware. [KH]  2327 Spoke with Dr. August Saucer of Orthopedics who is in agreement with plan for reduction and splinting. Would like CT completed prior to discharge, following completion of reduction. Requests office f/u on Monday; patient can call Monday morning for appointment time. [KH]  2348 Successful joint reduction with Ketamine. Posterior stirrup splint applied. [KH]  Sun Jan 14, 2023  0008 Improved alignment noted on repeat Xray [KH]  0025 Patient back to baseline. Awake and conversant. All questions answered. [KH]    Clinical Course User Index [KH] Antony Madura, PA-C   Medical Decision Making Amount and/or Complexity of Data Reviewed Radiology: ordered.  Risk Prescription drug management.          Deysha Cartier, Mayer Masker,  MD 01/22/23 [id number]

## 2023-01-29 DIAGNOSIS — S82842D Displaced bimalleolar fracture of left lower leg, subsequent encounter for closed fracture with routine healing: Secondary | ICD-10-CM | POA: Diagnosis not present

## 2023-02-13 DIAGNOSIS — Z9889 Other specified postprocedural states: Secondary | ICD-10-CM | POA: Diagnosis not present

## 2023-02-20 ENCOUNTER — Other Ambulatory Visit: Payer: Self-pay | Admitting: Medical-Surgical

## 2023-02-21 ENCOUNTER — Other Ambulatory Visit: Payer: Self-pay

## 2023-02-21 ENCOUNTER — Other Ambulatory Visit (HOSPITAL_COMMUNITY): Payer: Self-pay

## 2023-02-26 ENCOUNTER — Other Ambulatory Visit (HOSPITAL_COMMUNITY): Payer: Self-pay

## 2023-03-09 ENCOUNTER — Encounter: Payer: Self-pay | Admitting: Rehabilitative and Restorative Service Providers"

## 2023-03-09 ENCOUNTER — Other Ambulatory Visit: Payer: Self-pay

## 2023-03-09 ENCOUNTER — Ambulatory Visit
Payer: Commercial Managed Care - PPO | Attending: Orthopaedic Surgery | Admitting: Rehabilitative and Restorative Service Providers"

## 2023-03-09 DIAGNOSIS — R2689 Other abnormalities of gait and mobility: Secondary | ICD-10-CM

## 2023-03-09 DIAGNOSIS — M6281 Muscle weakness (generalized): Secondary | ICD-10-CM | POA: Diagnosis not present

## 2023-03-09 DIAGNOSIS — M25572 Pain in left ankle and joints of left foot: Secondary | ICD-10-CM | POA: Diagnosis not present

## 2023-03-09 NOTE — Therapy (Signed)
OUTPATIENT PHYSICAL THERAPY LOWER EXTREMITY EVALUATION   Patient Name: Destiny Graham MRN: [id number] DOB:Jul 26, 1956, 66 y.o., female Today's Date: 03/09/2023  END OF SESSION:  PT End of Session - 03/09/23 1035     Visit Number 1    Number of Visits 16    Date for PT Re-Evaluation 05/08/23    Authorization Type [facility] [employer]    PT Start Time 1020    PT Stop Time 1100    PT Time Calculation (min) 40 min    Activity Tolerance Patient tolerated treatment well    Behavior During Therapy [facility] for tasks assessed/performed            Past Medical History:  Diagnosis Date   Anxiety    Phreesia 07/27/2020   Arthritis    Phreesia 07/27/2020   Breast nodule    Cataract    Phreesia 07/27/2020   DDD (degenerative disc disease), cervical    Dysthymia    Generalized OA    GERD (gastroesophageal reflux disease)    Hallux rigidus    Heart murmur    beneign   Hyperlipidemia    Hypertension    OSA on CPAP    Does not use a cpap   Pseudophakia    Sleep apnea    Phreesia 07/27/2020   Thyroid disease    Uterine fibroid    Past Surgical History:  Procedure Laterality Date   BUNIONECTOMY     CATARACT EXTRACTION  2016   EYE SURGERY     FRACTURE SURGERY N/A    Phreesia 07/27/2020   fusion   2015   first MTP B   HARDWARE REMOVAL Left 06/15/2017   Procedure: Left Great Toe Hardware Removal;  Surgeon: Nadara Mustard, MD;  Location: [facility] OR;  Service: Orthopedics;  Laterality: Left;   LAPAROSCOPIC APPENDECTOMY N/A 02/01/2022   Procedure: APPENDECTOMY LAPAROSCOPIC;  Surgeon: Berna Bue, MD;  Location: [facility] OR;  Service: General;  Laterality: N/A;   MYOMECTOMY  1992   ORIF ANKLE FRACTURE Left 01/16/2023   Procedure: OPEN REDUCTION INTERNAL FIXATION (ORIF) LEFT ANKLE FRACTURE AND SYNDESMOSIS;  Surgeon: Terance Hart, MD;  Location: [facility] OR;  Service: Orthopedics;  Laterality: Left;   ORIF FINGER FRACTURE  2006   PAROTIDECTOMY Right 05/14/2020   Procedure: RIGHT SUPERFICIAL  PAROTIDECTOMY;  Surgeon: Osborn Coho, MD;  Location: [facility] OR;  Service: ENT;  Laterality: Right;  REQUESTING RNFA   THYROIDECTOMY  2005   TOENAIL EXCISION Right 06/15/2017   Procedure: Right Great Toe Excision Lateral Border Nail;  Surgeon: Nadara Mustard, MD;  Location: [facility] OR;  Service: Orthopedics;  Laterality: Right;   TONSILLECTOMY  1960   Patient Active Problem List   Diagnosis Date Noted   Perforated appendicitis 02/01/2022   Closed nondisplaced fracture of proximal phalanx of lesser toe of left foot 04/27/2021   Grief reaction 03/21/2021   Anxiety 03/21/2021   Psychophysiological insomnia 03/21/2021   Lung nodule seen on imaging study 07/28/2020   Parotid mass 05/14/2020   Elevated fasting glucose 05/21/2019   Stress fracture of right calcaneus 06/04/2017   Ingrown nail of great toe of right foot 06/04/2017   Pain from implanted hardware 06/04/2017   OSA on CPAP 10/10/2016   GERD (gastroesophageal reflux disease) 07/26/2016   DDD (degenerative disc disease), cervical 07/26/2016   Generalized OA 07/26/2016   Hypothyroidism 07/25/2016   Essential hypertension 07/25/2016   Hyperlipidemia 07/25/2016   MDD (major depressive disorder), recurrent episode, moderate (HCC) 07/25/2016    PCP:  Christen Butter, NP REFERRING PROVIDER: Dub Mikes, MD REFERRING DIAG: L ankle fracture  THERAPY DIAG:  Pain in left ankle and joints of left foot  Muscle weakness (generalized)  Other abnormalities of gait and mobility  Rationale for Evaluation and Treatment: Rehabilitation  ONSET DATE: 01/13/23 (date of surgery is 01/16/23)  SUBJECTIVE:   SUBJECTIVE STATEMENT: The patient reports that she had a fall on 01/13/23. She fell and fx her L fibula, tibia, and syndesmosis injury. She had surgery on 01/16/23. She was cleared for WBAT with boot on 02/27/23. She returned to work when partial WB with knee scooter and has returned to 12 hours with WBAT with boot. She reports pain has not been a big  issue.  PERTINENT HISTORY: HTN PAIN:  Are you having pain? Yes: NPRS scale: none/10 Pain location: L ankle Pain description: none today Aggravating factors: n/a Relieving factors: n/a  PRECAUTIONS: Other: Patient reports ankle DF/PF allowed-- she reports no inversion/eversion until after next MD visit. She was cleared on recumbent bike with ASO  WEIGHT BEARING RESTRICTIONS:  WBAT  FALLS:  Has patient fallen in last 6 months? Yes. 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  PLOF: Independent  PATIENT GOALS: home exercises and guidance for improving , not carrying heavy trash down steps or carrying laundry basket  NEXT MD VISIT: next week  OBJECTIVE:   PATIENT SURVEYS:  57% (goal to 75%)  SENSATION: WFL  EDEMA:  Figure 8: R 51cm L 62cm Circumferential calf 36 cm L and 38 cm R  PALPATION: No tenderness to palpation of L foot/ankle  LOWER EXTREMITY ROM: tight both sides, has h/o bilateral first digit surgeries Active ROM Right eval Left eval  Hip flexion    Hip extension    Hip abduction    Hip adduction    Hip internal rotation    Hip external rotation    Knee flexion    Knee extension    Ankle dorsiflexion 0 -2  Ankle plantarflexion 58 52  Ankle inversion    Ankle eversion     (Blank rows = not tested)  LOWER EXTREMITY MMT: TBA MMT Right eval Left eval  Hip flexion    Hip extension    Hip abduction    Hip adduction    Hip internal rotation    Hip external rotation    Knee flexion    Knee extension    Ankle dorsiflexion    Ankle plantarflexion    Ankle inversion    Ankle eversion     (Blank rows = not tested)  FUNCTIONAL TESTS:  5 times sit to stand: to be assessed 2 minute walk test: to be  assessed  GAIT: Distance walked: 100 ft Assistive device utilized: None *uses L ankle boot Level of assistance: Complete Independence  [facility] Adult PT Treatment:                                                DATE: 03/09/23 Therapeutic Exercise: See HEP below  PATIENT EDUCATION:  Education details: HEP Person educated: Patient Education method: Explanation, Demonstration, and Handouts Education comprehension: verbalized understanding and returned demonstration  HOME  EXERCISE PROGRAM: Access Code: [email or web address] URL: [email or web address] Date: 03/09/2023 Prepared by: Margretta Ditty  Exercises - Supine Bridge  - 2 x daily - 7 x weekly - 3 sets - 12 reps - Seated Ankle Dorsiflexion with Resistance  - 2 x daily - 7 x weekly - 3 sets - 10 reps - Ankle and Toe Plantarflexion with Resistance  - 2 x daily - 7 x weekly - 3 sets - 10 reps - Isometric Ankle Dorsiflexion and Plantarflexion  - 2 x daily - 7 x weekly - 1 sets - 10 reps - Long Sitting Isometric Ankle Plantarflexion with Ball at [employer]  - 2 x daily - 7 x weekly - 1 sets - 10 reps - Seated Toe Towel Scrunches  - 2 x daily - 7 x weekly - 1 sets - 10 reps  ASSESSMENT:  CLINICAL IMPRESSION: Patient is a 67 y.o. female who was seen today for physical therapy evaluation and treatment for L ankle fracture s/p ORIF. She presents today with impairments in LE strength (did not MMT due to acuity-- will at upcoming visits), ROM, edema, atrophy in L gastrocnemius. She reports cleared to [facility] and move in sagittal plane with ankle DF/PF. She has f/u with MD next week and hopes to be able to add inversion/eversion. PT initiated HEP and scheduled f/u for after next MD visit. Patient is motivated and will benefit from guidance with progression of HEP to add strengthening, proprioception, scar massage, and ROM.    OBJECTIVE IMPAIRMENTS: Abnormal gait, decreased activity tolerance, decreased balance, decreased ROM, decreased strength,  hypomobility, impaired flexibility, and pain.   ACTIVITY LIMITATIONS: squatting, stairs, locomotion level, and caring for others  PARTICIPATION LIMITATIONS: community activity, occupation, and yard work  PERSONAL FACTORS: 1 comorbidity: HTN  are also affecting patient's functional outcome.   REHAB POTENTIAL: Good  CLINICAL DECISION MAKING: Stable/uncomplicated  EVALUATION COMPLEXITY: Low   GOALS: Goals reviewed with patient? Yes  SHORT TERM GOALS: Target date: 04/07/23 The patient will be indep with HEP Baseline:Initiated at eval Goal status: INITIAL  2.  The patient will improve L ankle DF AROM to 5 degrees (note tight bilat-- has h/o prior bilat foot surgery)  Baseline: -2 degrees Goal status: INITIAL  3.  The patient will be measured for gait speed, 5 time sit to stand, and single leg stance (once boot removed). Baseline:  TBA Goal status: INITIAL   LONG TERM GOALS: Target date: 05/08/23  The patient will be indep with HEP progression. Baseline: Initiated at eval Goal status: INITIAL  2.  The patient will improve FOTO to 75%. Baseline:  57% Goal status: INITIAL  3.  The patient will have gait speed > or equal to 2.63 ft/sec to demo full community ambulator classification of gait. Baseline:  TBA Goal status: INITIAL  4.  The patient will demonstrate a squat to reach floor and return to stand to demo functional ankle motion for household tasks. Baseline:  TBA Goal status: INITIAL  5.  The patient will demonstrate independence ambulating outdoors on grass, curbs, and hills without boot donned to return to community mobility. Baseline:  Has boot for gait Goal status: INITIAL  6.  The patient will perform stairs with reciprocal pattern x 8 steps with one handrail mod indep. Baseline:  Notes step to pattern Goal status: INITIAL   PLAN:  PT FREQUENCY: 1-2x/week  PT DURATION: 8 weeks  PLANNED INTERVENTIONS: Therapeutic exercises, Therapeutic activity,  Neuromuscular re-education, Balance training, Gait training, Patient/Family education, Self Care, Joint mobilization,  Dry Needling, Electrical stimulation, Cryotherapy, Moist heat, scar mobilization, Taping, Vasopneumatic device, Ionotophoresis 4mg /ml Dexamethasone, Manual therapy, and Re-evaluation  PLAN FOR NEXT SESSION: check HeP, progress depending on clearance for further motion at ankle. Assess gait speed and 5 time sit<>stand, assess single leg standing.   Italia Wolfert, PT 03/09/2023, 2:11 PM

## 2023-03-14 ENCOUNTER — Other Ambulatory Visit: Payer: Self-pay | Admitting: Medical-Surgical

## 2023-03-14 ENCOUNTER — Other Ambulatory Visit (HOSPITAL_COMMUNITY): Payer: Self-pay

## 2023-03-14 ENCOUNTER — Other Ambulatory Visit: Payer: Self-pay

## 2023-03-14 DIAGNOSIS — Z09 Encounter for follow-up examination after completed treatment for conditions other than malignant neoplasm: Secondary | ICD-10-CM | POA: Diagnosis not present

## 2023-03-14 DIAGNOSIS — S82842A Displaced bimalleolar fracture of left lower leg, initial encounter for closed fracture: Secondary | ICD-10-CM | POA: Diagnosis not present

## 2023-03-16 ENCOUNTER — Other Ambulatory Visit: Payer: Self-pay

## 2023-03-16 MED ORDER — ATENOLOL 50 MG PO TABS
50.0000 mg | ORAL_TABLET | Freq: Every day | ORAL | 0 refills | Status: DC
  Filled 2023-03-16: qty 30, 30d supply, fill #0

## 2023-03-16 MED ORDER — LEVOTHYROXINE SODIUM 150 MCG PO TABS
150.0000 ug | ORAL_TABLET | Freq: Every day | ORAL | 0 refills | Status: DC
  Filled 2023-03-16: qty 30, 30d supply, fill #0

## 2023-03-23 ENCOUNTER — Ambulatory Visit
Payer: Commercial Managed Care - PPO | Attending: Orthopaedic Surgery | Admitting: Rehabilitative and Restorative Service Providers"

## 2023-03-23 DIAGNOSIS — M6281 Muscle weakness (generalized): Secondary | ICD-10-CM | POA: Insufficient documentation

## 2023-03-23 DIAGNOSIS — R2689 Other abnormalities of gait and mobility: Secondary | ICD-10-CM | POA: Insufficient documentation

## 2023-03-23 DIAGNOSIS — M25572 Pain in left ankle and joints of left foot: Secondary | ICD-10-CM | POA: Insufficient documentation

## 2023-03-23 NOTE — Therapy (Deleted)
OUTPATIENT PHYSICAL THERAPY LOWER EXTREMITY TREATMENT   Patient Name: Destiny Graham MRN: [id number] DOB:[birth year], 67 y.o., female Today's Date: 03/23/2023  END OF SESSION:   Past Medical History:  Diagnosis Date   Anxiety    Phreesia 07/27/2020   Arthritis    Phreesia 07/27/2020   Breast nodule    Cataract    Phreesia 07/27/2020   DDD (degenerative disc disease), cervical    Dysthymia    Generalized OA    GERD (gastroesophageal reflux disease)    Hallux rigidus    Heart murmur    beneign   Hyperlipidemia    Hypertension    OSA on CPAP    Does not use a cpap   Pseudophakia    Sleep apnea    Phreesia 07/27/2020   Thyroid disease    Uterine fibroid    Past Surgical History:  Procedure Laterality Date   BUNIONECTOMY     CATARACT EXTRACTION  2016   EYE SURGERY     FRACTURE SURGERY N/A    Phreesia 07/27/2020   fusion   2015   first MTP B   HARDWARE REMOVAL Left 06/15/2017   Procedure: Left Great Toe Hardware Removal;  Surgeon: Nadara Mustard, MD;  Location: [facility] OR;  Service: Orthopedics;  Laterality: Left;   LAPAROSCOPIC APPENDECTOMY N/A 02/01/2022   Procedure: APPENDECTOMY LAPAROSCOPIC;  Surgeon: Berna Bue, MD;  Location: [facility] OR;  Service: General;  Laterality: N/A;   MYOMECTOMY  1992   ORIF ANKLE FRACTURE Left 01/16/2023   Procedure: OPEN REDUCTION INTERNAL FIXATION (ORIF) LEFT ANKLE FRACTURE AND SYNDESMOSIS;  Surgeon: Terance Hart, MD;  Location: [facility] OR;  Service: Orthopedics;  Laterality: Left;   ORIF FINGER FRACTURE  2006   PAROTIDECTOMY Right 05/14/2020   Procedure: RIGHT SUPERFICIAL PAROTIDECTOMY;  Surgeon: Osborn Coho, MD;  Location: [facility] OR;  Service: ENT;  Laterality: Right;  REQUESTING RNFA   THYROIDECTOMY  2005   TOENAIL EXCISION Right 06/15/2017   Procedure: Right Great Toe Excision Lateral Border Nail;  Surgeon: Nadara Mustard, MD;  Location: [facility] OR;  Service: Orthopedics;  Laterality: Right;   TONSILLECTOMY  1960   Patient Active Problem  List   Diagnosis Date Noted   Perforated appendicitis 02/01/2022   Closed nondisplaced fracture of proximal phalanx of lesser toe of left foot 04/27/2021   Grief reaction 03/21/2021   Anxiety 03/21/2021   Psychophysiological insomnia 03/21/2021   Lung nodule seen on imaging study 07/28/2020   Parotid mass 05/14/2020   Elevated fasting glucose 05/21/2019   Stress fracture of right calcaneus 06/04/2017   Ingrown nail of great toe of right foot 06/04/2017   Pain from implanted hardware 06/04/2017   OSA on CPAP 10/10/2016   GERD (gastroesophageal reflux disease) 07/26/2016   DDD (degenerative disc disease), cervical 07/26/2016   Generalized OA 07/26/2016   Hypothyroidism 07/25/2016   Essential hypertension 07/25/2016   Hyperlipidemia 07/25/2016   MDD (major depressive disorder), recurrent episode, moderate (HCC) 07/25/2016    PCP: Christen Butter, NP REFERRING PROVIDER: Dub Mikes, MD REFERRING DIAG: L ankle fracture THERAPY DIAG:  No diagnosis found.  Rationale for Evaluation and Treatment: Rehabilitation  ONSET DATE: 01/13/23 (date of surgery is 01/16/23)  SUBJECTIVE:  SUBJECTIVE STATEMENT: ***  Eval:The patient reports that she had a fall on 01/13/23. She fell and fx her L fibula, tibia, and syndesmosis injury. She had surgery on 01/16/23. She was cleared for WBAT with boot on 02/27/23. She returned to work when partial WB with knee scooter and  has returned to 12 hours with WBAT with boot. She reports pain has not been a big issue. PERTINENT HISTORY: HTN PAIN:  Are you having pain? Yes: NPRS scale:  /10 Pain location: L ankle Pain description: none today Aggravating factors: n/a Relieving factors: n/a PRECAUTIONS: Other: Patient reports ankle DF/PF allowed-- she reports no inversion/eversion until after next MD visit. She was cleared on recumbent bike with ASO WEIGHT BEARING RESTRICTIONS:  WBAT FALLS:  Has patient fallen in last 6 months? Yes. 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 PATIENT GOALS: home exercises and guidance for improving , not carrying heavy trash down steps or carrying laundry basket NEXT MD VISIT: next week  OBJECTIVE:  (Measures in this section from initial evaluation unless otherwise noted) PATIENT SURVEYS:  57% (goal to 75%) EDEMA:  Figure 8: R 51cm L 62cm Circumferential calf 36 cm L and 38 cm R LOWER EXTREMITY ROM: tight both sides, has h/o bilateral first digit surgeries Active ROM Right eval Left eval  Hip flexion    Hip extension    Hip abduction    Hip adduction    Hip internal rotation    Hip external rotation    Knee flexion    Knee extension    Ankle dorsiflexion 0 -2  Ankle plantarflexion 58 52  Ankle inversion    Ankle eversion     (Blank rows = not tested) LOWER EXTREMITY MMT: TBA MMT Right eval Left eval  Hip flexion    Hip extension    Hip abduction    Hip adduction    Hip internal rotation    Hip external rotation    Knee flexion    Knee extension    Ankle dorsiflexion    Ankle plantarflexion    Ankle inversion    Ankle eversion     (Blank rows = not tested) FUNCTIONAL TESTS:  5 times sit to stand: to be assessed 2 minute walk test: to be assessed GAIT: Distance walked: 100 ft Assistive device utilized: None *uses L ankle boot Level of assistance: Complete Independence  [facility] Adult PT Treatment:                                                DATE: 03/23/23 Therapeutic Exercise: *** Manual Therapy: *** Neuromuscular re-ed: *** Therapeutic Activity: *** Gait: *** Modalities: *** Self Care: [name] Adult PT Treatment:                                                DATE: 03/09/23 Therapeutic Exercise: See HEP below  PATIENT EDUCATION:  Education details: HEP Person educated: Patient Education method: Explanation, Demonstration, and Handouts Education comprehension: verbalized understanding and returned demonstration  HOME EXERCISE PROGRAM: Access  Code: [email or web address] URL: [email or web address] Date: 03/09/2023 Prepared by: Margretta Ditty  Exercises - Supine Bridge  - 2 x daily - 7 x weekly - 3 sets - 12 reps - Seated Ankle Dorsiflexion with Resistance  - 2 x daily - 7 x weekly - 3 sets - 10 reps - Ankle and Toe Plantarflexion with Resistance  - 2 x daily - 7 x weekly - 3 sets - 10 reps - Isometric Ankle Dorsiflexion and Plantarflexion  -  2 x daily - 7 x weekly - 1 sets - 10 reps - Long Sitting Isometric Ankle Plantarflexion with Ball at [employer]  - 2 x daily - 7 x weekly - 1 sets - 10 reps - Seated Toe Towel Scrunches  - 2 x daily - 7 x weekly - 1 sets - 10 reps  ASSESSMENT:  CLINICAL IMPRESSION: ***Patient is a 67 y.o. female who was seen today for physical therapy evaluation and treatment for L ankle fracture s/p ORIF. She presents today with impairments in LE strength (did not MMT due to acuity-- will at upcoming visits), ROM, edema, atrophy in L gastrocnemius. She reports cleared to [facility] and move in sagittal plane with ankle DF/PF. She has f/u with MD next week and hopes to be able to add inversion/eversion. PT initiated HEP and scheduled f/u for after next MD visit. Patient is motivated and will benefit from guidance with progression of HEP to add strengthening, proprioception, scar massage, and ROM.    OBJECTIVE IMPAIRMENTS: Abnormal gait, decreased activity tolerance, decreased balance, decreased ROM, decreased strength, hypomobility, impaired flexibility, and pain.   GOALS: Goals reviewed with patient? Yes  SHORT TERM GOALS: Target date: 04/07/23 The patient will be indep with HEP Baseline:Initiated at eval Goal status: INITIAL  2.  The patient will improve L ankle DF AROM to 5 degrees (note tight bilat-- has h/o prior bilat foot surgery)  Baseline: -2 degrees Goal status: INITIAL  3.  The patient will be measured for gait speed, 5 time sit to stand, and single leg stance (once boot removed). Baseline:  TBA Goal  status: INITIAL   LONG TERM GOALS: Target date: 05/08/23  The patient will be indep with HEP progression. Baseline: Initiated at eval Goal status: INITIAL  2.  The patient will improve FOTO to 75%. Baseline:  57% Goal status: INITIAL  3.  The patient will have gait speed > or equal to 2.63 ft/sec to demo full community ambulator classification of gait. Baseline:  TBA Goal status: INITIAL  4.  The patient will demonstrate a squat to reach floor and return to stand to demo functional ankle motion for household tasks. Baseline:  TBA Goal status: INITIAL  5.  The patient will demonstrate independence ambulating outdoors on grass, curbs, and hills without boot donned to return to community mobility. Baseline:  Has boot for gait Goal status: INITIAL  6.  The patient will perform stairs with reciprocal pattern x 8 steps with one handrail mod indep. Baseline:  Notes step to pattern Goal status: INITIAL   PLAN:  PT FREQUENCY: 1-2x/week  PT DURATION: 8 weeks  PLANNED INTERVENTIONS: Therapeutic exercises, Therapeutic activity, Neuromuscular re-education, Balance training, Gait training, Patient/Family education, Self Care, Joint mobilization, Dry Needling, Electrical stimulation, Cryotherapy, Moist heat, scar mobilization, Taping, Vasopneumatic device, Ionotophoresis 4mg /ml Dexamethasone, Manual therapy, and Re-evaluation  PLAN FOR NEXT SESSION: check HeP, progress depending on clearance for further motion at ankle. Assess gait speed and 5 time sit<>stand, assess single leg standing.***   ,, PT 03/23/2023, 10:56 AM

## 2023-04-17 ENCOUNTER — Encounter: Payer: Self-pay | Admitting: Medical-Surgical

## 2023-04-17 DIAGNOSIS — I10 Essential (primary) hypertension: Secondary | ICD-10-CM

## 2023-04-17 DIAGNOSIS — E890 Postprocedural hypothyroidism: Secondary | ICD-10-CM

## 2023-04-17 DIAGNOSIS — R7301 Impaired fasting glucose: Secondary | ICD-10-CM

## 2023-04-17 DIAGNOSIS — E7800 Pure hypercholesterolemia, unspecified: Secondary | ICD-10-CM

## 2023-04-19 DIAGNOSIS — E7800 Pure hypercholesterolemia, unspecified: Secondary | ICD-10-CM | POA: Diagnosis not present

## 2023-04-19 DIAGNOSIS — I10 Essential (primary) hypertension: Secondary | ICD-10-CM | POA: Diagnosis not present

## 2023-04-19 DIAGNOSIS — E890 Postprocedural hypothyroidism: Secondary | ICD-10-CM | POA: Diagnosis not present

## 2023-04-19 DIAGNOSIS — R7301 Impaired fasting glucose: Secondary | ICD-10-CM | POA: Diagnosis not present

## 2023-04-20 ENCOUNTER — Encounter: Payer: Self-pay | Admitting: Medical-Surgical

## 2023-04-20 ENCOUNTER — Ambulatory Visit: Payer: Commercial Managed Care - PPO | Admitting: Medical-Surgical

## 2023-04-20 ENCOUNTER — Other Ambulatory Visit (HOSPITAL_COMMUNITY): Payer: Self-pay

## 2023-04-20 VITALS — BP 147/84 | HR 75 | Ht 67.0 in | Wt 182.1 lb

## 2023-04-20 DIAGNOSIS — F419 Anxiety disorder, unspecified: Secondary | ICD-10-CM | POA: Diagnosis not present

## 2023-04-20 DIAGNOSIS — I10 Essential (primary) hypertension: Secondary | ICD-10-CM

## 2023-04-20 DIAGNOSIS — K219 Gastro-esophageal reflux disease without esophagitis: Secondary | ICD-10-CM

## 2023-04-20 DIAGNOSIS — E890 Postprocedural hypothyroidism: Secondary | ICD-10-CM | POA: Diagnosis not present

## 2023-04-20 DIAGNOSIS — F4321 Adjustment disorder with depressed mood: Secondary | ICD-10-CM | POA: Diagnosis not present

## 2023-04-20 DIAGNOSIS — F331 Major depressive disorder, recurrent, moderate: Secondary | ICD-10-CM | POA: Diagnosis not present

## 2023-04-20 DIAGNOSIS — Z0000 Encounter for general adult medical examination without abnormal findings: Secondary | ICD-10-CM

## 2023-04-20 DIAGNOSIS — Z7689 Persons encountering health services in other specified circumstances: Secondary | ICD-10-CM

## 2023-04-20 DIAGNOSIS — M25572 Pain in left ankle and joints of left foot: Secondary | ICD-10-CM | POA: Diagnosis not present

## 2023-04-20 DIAGNOSIS — F341 Dysthymic disorder: Secondary | ICD-10-CM | POA: Diagnosis not present

## 2023-04-20 DIAGNOSIS — Z1231 Encounter for screening mammogram for malignant neoplasm of breast: Secondary | ICD-10-CM | POA: Diagnosis not present

## 2023-04-20 DIAGNOSIS — G4733 Obstructive sleep apnea (adult) (pediatric): Secondary | ICD-10-CM | POA: Diagnosis not present

## 2023-04-20 MED ORDER — LEVOTHYROXINE SODIUM 150 MCG PO TABS
ORAL_TABLET | ORAL | 0 refills | Status: DC
  Filled 2023-04-20: qty 90, 90d supply, fill #0

## 2023-04-20 MED ORDER — ATENOLOL 50 MG PO TABS
50.0000 mg | ORAL_TABLET | Freq: Every day | ORAL | 3 refills | Status: DC
  Filled 2023-04-20: qty 90, 90d supply, fill #0
  Filled 2023-07-21: qty 90, 90d supply, fill #1
  Filled 2023-10-19: qty 90, 90d supply, fill #2
  Filled 2024-01-16: qty 90, 90d supply, fill #3

## 2023-04-20 MED ORDER — NALTREXONE-BUPROPION HCL ER 8-90 MG PO TB12
ORAL_TABLET | ORAL | 0 refills | Status: DC
  Filled 2023-04-20: qty 120, 30d supply, fill #0

## 2023-04-20 MED ORDER — HYDROCHLOROTHIAZIDE 12.5 MG PO TABS
12.5000 mg | ORAL_TABLET | Freq: Every day | ORAL | 3 refills | Status: DC
  Filled 2023-04-20: qty 90, 90d supply, fill #0

## 2023-04-20 NOTE — Patient Instructions (Addendum)
Add hydrochlorothiazide 12.5mg  daily to Atenolol 50mg  daily  Take Levothyroxine daily 6 days per week. On day 7, take 1/2 tab ( ). Recheck TSH in 6-8 weeks.

## 2023-04-20 NOTE — Progress Notes (Signed)
Complete physical exam  Patient: Destiny Graham   DOB: May 18, 1956   67 y.o. Female  MRN: [id number]  Subjective:    Chief Complaint  Patient presents with   Blood Pressure Check    Destiny Graham is a 67 y.o. female who presents today for a complete physical exam. She reports consuming a general diet. Home exercise routine includes stationary biking 45 minutes daily. She generally feels fairly well. She reports sleeping poorly. She does not have additional problems to discuss today.    Most recent fall risk assessment:    08/30/2021    1:41 PM  Fall Risk   Falls in the past year? 0  Number falls in past yr: 0  Injury with Fall? 0  Risk for fall due to : No Fall Risks  Follow up Falls evaluation completed     Most recent depression screenings:    08/30/2021    1:41 PM 03/21/2021   11:10 AM  PHQ 2/9 Scores  PHQ - 2 Score 6 6  PHQ- 9 Score 17 13   Vision:Not within last year , Dental: No current dental problems and Receives regular dental care, and STD: The patient denies history of sexually transmitted disease.    Patient Care Team: Christen Butter, NP as PCP - General (Nurse Practitioner)   Outpatient Medications Prior to Visit  Medication Sig   acetaminophen (TYLENOL) 500 MG tablet Take 1,000 mg by mouth every 6 (six) hours as needed for mild pain or headache.    aspirin (BAYER ASPIRIN) 325 MG tablet Take 1 tablet by mouth for 30 DAYS for blood clot prevention   DULoxetine (CYMBALTA) 60 MG capsule Take 1 capsule by mouth daily.   estradiol (ESTRACE VAGINAL) 0.1 MG/GM vaginal cream Place 1 Applicatorful vaginally at bedtime. (Patient taking differently: Place 1 Applicatorful vaginally at bedtime. 3 times a week)   gabapentin (NEURONTIN) 300 MG capsule Take by mouth.   HYDROcodone-acetaminophen (NORCO/VICODIN) 5-325 MG tablet Take by mouth.   naproxen sodium (ALEVE) 220 MG tablet Take 440 mg by mouth 2 (two) times daily as needed (arthritis pain).   omeprazole (PRILOSEC)  20 MG capsule Take 1 capsule (20 mg total) by mouth daily.   ondansetron (ZOFRAN-ODT) 4 MG disintegrating tablet Take 4 mg by mouth every 8 (eight) hours as needed.   Propylene Glycol (SYSTANE COMPLETE OP) Place 1 drop into both eyes daily as needed (irritation).   rosuvastatin (CRESTOR) 20 MG tablet Take 1 tablet (20 mg total) by mouth daily.   Tretinoin (RETIN-A EX) Apply 1 application. topically 2 (two) times a week.   Tretinoin, Facial Wrinkles, (RENOVA) 0.02 % CREA Apply topically.   [DISCONTINUED] atenolol (TENORMIN) 50 MG tablet Take 1 tablet (50 mg total) by mouth daily. OVERDUE FOR A VISIT. NEEDS AN APPT   [DISCONTINUED] atorvastatin (LIPITOR) 80 MG tablet Take by mouth.   [DISCONTINUED] levothyroxine (SYNTHROID) 150 MCG tablet Take 1 tablet (150 mcg total) by mouth daily. OVERDUE FOR A VISIT & LABS. NEEDS AN APPT   No facility-administered medications prior to visit.    Review of Systems  Constitutional:  Negative for chills, fever, malaise/fatigue and weight loss.  HENT:  Negative for congestion, ear pain, hearing loss, sinus pain and sore throat.   Eyes:  Negative for blurred vision, photophobia and pain.  Respiratory:  Negative for cough, shortness of breath and wheezing.   Cardiovascular:  Negative for chest pain, palpitations and leg swelling.  Gastrointestinal:  Negative for abdominal pain, constipation, diarrhea, heartburn, nausea  and vomiting.  Genitourinary:  Negative for dysuria, frequency and urgency.  Musculoskeletal:  Negative for falls and neck pain.  Skin:  Negative for itching and rash.  Neurological:  Negative for dizziness, weakness and headaches.  Endo/Heme/Allergies:  Negative for polydipsia. Does not bruise/bleed easily.  Psychiatric/Behavioral:  Negative for depression, substance abuse and suicidal ideas. The patient is not nervous/anxious.      Objective:    BP (!) 147/84   Pulse 75   Ht 5\' 7"  (1.702 m)   Wt 182 lb 1.3 oz (82.6 kg)   SpO2 97%   BMI  28.52 kg/m    Physical Exam Vitals reviewed.  Constitutional:      General: She is not in acute distress.    Appearance: Normal appearance. She is not ill-appearing.  HENT:     Head: Normocephalic and atraumatic.     Right Ear: Tympanic membrane, ear canal and external ear normal. There is no impacted cerumen.     Left Ear: Tympanic membrane, ear canal and external ear normal. There is no impacted cerumen.     Nose: Nose normal. No congestion or rhinorrhea.     Mouth/Throat:     Mouth: Mucous membranes are moist.     Pharynx: No oropharyngeal exudate or posterior oropharyngeal erythema.  Eyes:     General: No scleral icterus.       Right eye: No discharge.        Left eye: No discharge.     Extraocular Movements: Extraocular movements intact.     Conjunctiva/sclera: Conjunctivae normal.     Pupils: Pupils are equal, round, and reactive to light.  Neck:     Thyroid: No thyromegaly.     Vascular: No carotid bruit or JVD.     Trachea: Trachea normal.  Cardiovascular:     Rate and Rhythm: Normal rate and regular rhythm.     Pulses: Normal pulses.     Heart sounds: Normal heart sounds. No murmur heard.    No friction rub. No gallop.  Pulmonary:     Effort: Pulmonary effort is normal. No respiratory distress.     Breath sounds: Normal breath sounds. No wheezing.  Abdominal:     General: Bowel sounds are normal. There is no distension.     Palpations: Abdomen is soft.     Tenderness: There is no abdominal tenderness. There is no guarding.  Musculoskeletal:        General: Normal range of motion.     Cervical back: Normal range of motion and neck supple.  Lymphadenopathy:     Cervical: No cervical adenopathy.  Skin:    General: Skin is warm and dry.  Neurological:     Mental Status: She is alert and oriented to person, place, and time.     Cranial Nerves: No cranial nerve deficit.  Psychiatric:        Mood and Affect: Mood normal.        Behavior: Behavior normal.         Thought Content: Thought content normal.        Judgment: Judgment normal.    No results found for any visits on 04/20/23.     Assessment & Plan:    Routine Health Maintenance and Physical Exam  Immunization History  Administered Date(s) Administered   COVID-19, mRNA, vaccine(Comirnaty)12 years and older 06/29/2022   Fluad Quad(high Dose 65+) 06/18/2020, 05/31/2021, 06/29/2022   Influenza-Unspecified 06/12/2019, 07/11/2019   PFIZER(Purple Top)SARS-COV-2 Vaccination 09/09/2019, 09/30/2019, 06/11/2020  [employer] officer, [employer] 3yrs & up 05/30/2021   Pneumococcal Conjugate-13 03/24/2020   Pneumococcal Polysaccharide-23 06/20/2022   Tdap 04/08/2019   Zoster Recombinant(Shingrix) 10/17/2017    Health Maintenance  Topic Date Due   MAMMOGRAM  04/14/2022   COVID-19 Vaccine (6 - 2023-24 season) 10/30/2022   INFLUENZA VACCINE  04/12/2023   Zoster Vaccines- Shingrix (2 of 2) 07/21/2023 (Originally 12/12/2017)   Hepatitis C Screening  09/11/2048 (Originally 09/30/1972)   Colonoscopy  02/15/2025   DTaP/Tdap/Td (2 - Td or Tdap) 04/07/2029   Pneumonia Vaccine 7+ Years old  Completed   DEXA SCAN  Completed   HPV VACCINES  Aged Out    Discussed health benefits of physical activity, and encouraged her to engage in regular exercise appropriate for her age and condition.  1. Annual physical exam Labs drawn prior to the appointment and reviewed today.  Recommend updating vision exam.  Wellness information provided with AVS.  2. Postoperative hypothyroidism TSH back at 0.305 indicating overtreatment.  Reducing levothyroxine to 150 mg daily x 6 days/week then on day 7, take 75 mcg.  Recheck TSH in 6-8 weeks on new regimen.  3. Essential hypertension Has been taking atenolol 50 mg daily however this does not seem to be managing her blood pressure well.  She has had some days where her blood pressure is in the 160/90 range and others that she sees numbers upwards of 190/100 if  she misses her dose.  Discussed options.  Continue atenolol 50 mg daily.  Adding hydrochlorothiazide 12.5 mg daily.  She will monitor her blood pressure with a goal of 130/80 or less.  If consistently elevated, we will need to make further changes.  4. OSA on CPAP History of sleep apnea with previous CPAP use however has not been using this recently as she was having difficulty tolerating it.  Was supposed to get an updated sleep study 2 years ago but that fell through.  She would like to have a referral for sleep studies to repeat this and discuss options for management of CPAP that are available now. - Ambulatory referral to Sleep Studies  5. Gastroesophageal reflux disease without esophagitis History of significant GERD with long-term treatment with omeprazole.  Notes that she should probably get back into see GI to see if an endoscopy is recommended for follow-up.  Referring to GI per patient request. - Ambulatory referral to Gastroenterology  6. Anxiety 7. Grief reaction 8. Dysthymia Taking Cymbalta 60 mg daily, tolerating well without side effects.  Feels that it is helping with her mood however she does report lifelong issues with dysthymia.  Denies SI/HI.  Plan to continue Cymbalta 60 mg daily.  9. Encounter for screening mammogram for malignant neoplasm of breast Mammogram ordered. - MM 3D SCREENING MAMMOGRAM BILATERAL BREAST; Future  10.  Encounter for weight management Notes that she is struggling with her weight and has been unable to lose it due to her recent inactivity with orthopedic concerns.  She has been cleared and has return to using her stationary bike 45 minutes daily.  Working on improving her dietary intake and portion control.  Discussed various options.  Starting Contrave as this may help with mood management in addition to weight.  Return in about 3 months (around 07/21/2023) for weight check.   Christen Butter, NP

## 2023-04-21 ENCOUNTER — Other Ambulatory Visit (HOSPITAL_COMMUNITY): Payer: Self-pay

## 2023-04-26 ENCOUNTER — Other Ambulatory Visit (HOSPITAL_COMMUNITY): Payer: Self-pay

## 2023-04-26 ENCOUNTER — Other Ambulatory Visit: Payer: Self-pay

## 2023-04-27 ENCOUNTER — Telehealth: Payer: Self-pay

## 2023-04-27 NOTE — Telephone Encounter (Signed)
PA pending

## 2023-04-30 ENCOUNTER — Other Ambulatory Visit (HOSPITAL_COMMUNITY): Payer: Self-pay

## 2023-04-30 ENCOUNTER — Encounter (HOSPITAL_COMMUNITY): Payer: Self-pay

## 2023-05-01 ENCOUNTER — Encounter: Payer: Self-pay | Admitting: Medical-Surgical

## 2023-05-01 ENCOUNTER — Other Ambulatory Visit: Payer: Self-pay | Admitting: Medical-Surgical

## 2023-05-05 ENCOUNTER — Encounter (HOSPITAL_COMMUNITY): Payer: Self-pay

## 2023-05-07 ENCOUNTER — Other Ambulatory Visit (HOSPITAL_COMMUNITY): Payer: Self-pay

## 2023-05-10 ENCOUNTER — Encounter: Payer: Self-pay | Admitting: Medical-Surgical

## 2023-05-11 MED ORDER — VALSARTAN 40 MG PO TABS: 40.0000 mg | ORAL_TABLET | Freq: Every day | ORAL | 3 refills | Status: DC

## 2023-05-20 ENCOUNTER — Other Ambulatory Visit (HOSPITAL_COMMUNITY): Payer: Self-pay

## 2023-05-24 ENCOUNTER — Ambulatory Visit (INDEPENDENT_AMBULATORY_CARE_PROVIDER_SITE_OTHER): Payer: Commercial Managed Care - PPO

## 2023-05-24 DIAGNOSIS — Z1231 Encounter for screening mammogram for malignant neoplasm of breast: Secondary | ICD-10-CM | POA: Diagnosis not present

## 2023-05-28 ENCOUNTER — Other Ambulatory Visit: Payer: Self-pay | Admitting: Medical-Surgical

## 2023-05-28 ENCOUNTER — Encounter: Payer: Self-pay | Admitting: Medical-Surgical

## 2023-05-29 ENCOUNTER — Other Ambulatory Visit (HOSPITAL_COMMUNITY): Payer: Self-pay

## 2023-06-04 ENCOUNTER — Other Ambulatory Visit (HOSPITAL_COMMUNITY): Payer: Self-pay

## 2023-06-04 MED ORDER — VALSARTAN 40 MG PO TABS
40.0000 mg | ORAL_TABLET | Freq: Every day | ORAL | 3 refills | Status: DC
  Filled 2023-06-04: qty 90, 90d supply, fill #0
  Filled 2023-08-28: qty 90, 90d supply, fill #1
  Filled 2023-11-29: qty 90, 90d supply, fill #2
  Filled 2024-02-24: qty 90, 90d supply, fill #3

## 2023-06-05 ENCOUNTER — Other Ambulatory Visit (HOSPITAL_COMMUNITY): Payer: Self-pay

## 2023-06-05 ENCOUNTER — Other Ambulatory Visit: Payer: Self-pay

## 2023-06-05 MED ORDER — CONTRAVE 8-90 MG PO TB12
ORAL_TABLET | ORAL | 0 refills | Status: DC
  Filled 2023-06-05: qty 120, 30d supply, fill #0

## 2023-06-11 ENCOUNTER — Other Ambulatory Visit (HOSPITAL_COMMUNITY): Payer: Self-pay

## 2023-06-14 ENCOUNTER — Encounter: Payer: Self-pay | Admitting: Medical-Surgical

## 2023-06-18 MED ORDER — ATENOLOL 25 MG PO TABS
25.0000 mg | ORAL_TABLET | Freq: Every day | ORAL | 3 refills | Status: DC
  Filled 2023-06-18: qty 90, 90d supply, fill #0
  Filled 2023-09-17: qty 90, 90d supply, fill #1
  Filled 2023-12-13 – 2023-12-17 (×2): qty 90, 90d supply, fill #2
  Filled 2024-03-03: qty 90, 90d supply, fill #3

## 2023-06-19 ENCOUNTER — Other Ambulatory Visit: Payer: Self-pay

## 2023-06-27 ENCOUNTER — Encounter: Payer: Self-pay | Admitting: Gastroenterology

## 2023-07-19 ENCOUNTER — Telehealth: Payer: Self-pay | Admitting: Gastroenterology

## 2023-07-19 ENCOUNTER — Encounter: Payer: Self-pay | Admitting: Medical-Surgical

## 2023-07-19 NOTE — Telephone Encounter (Signed)
Left message for patient to call back  

## 2023-07-19 NOTE — Telephone Encounter (Signed)
Inbound call from Dr. Delton See wishing for appointment to be pushed up. Patient is currently scheduled for January 15, but states food has been getting stuck and is urgently requesting an endoscopy. She would like to see if she can be seen sooner. Please advise.

## 2023-07-20 NOTE — Telephone Encounter (Signed)
Left message for patient to call back  

## 2023-07-20 NOTE — Telephone Encounter (Signed)
I have spoken to patient to who has scheduled an appointment with Dr Barron Alvine on Monday, 07/23/23 at 1020 am for evaluation of GERD and dysphagia; I asked patient to arrive at 1000 am for registration. She verbalizes understanding.

## 2023-07-21 ENCOUNTER — Other Ambulatory Visit: Payer: Self-pay | Admitting: Medical-Surgical

## 2023-07-23 ENCOUNTER — Ambulatory Visit: Payer: Commercial Managed Care - PPO | Admitting: Gastroenterology

## 2023-07-23 ENCOUNTER — Other Ambulatory Visit (HOSPITAL_COMMUNITY): Payer: Self-pay

## 2023-07-23 ENCOUNTER — Encounter: Payer: Self-pay | Admitting: Gastroenterology

## 2023-07-23 ENCOUNTER — Other Ambulatory Visit: Payer: Self-pay

## 2023-07-23 ENCOUNTER — Other Ambulatory Visit (INDEPENDENT_AMBULATORY_CARE_PROVIDER_SITE_OTHER): Payer: Commercial Managed Care - PPO

## 2023-07-23 VITALS — BP 126/80 | HR 77 | Ht 67.0 in | Wt 185.0 lb

## 2023-07-23 DIAGNOSIS — R197 Diarrhea, unspecified: Secondary | ICD-10-CM

## 2023-07-23 DIAGNOSIS — R1310 Dysphagia, unspecified: Secondary | ICD-10-CM | POA: Diagnosis not present

## 2023-07-23 DIAGNOSIS — R159 Full incontinence of feces: Secondary | ICD-10-CM | POA: Diagnosis not present

## 2023-07-23 DIAGNOSIS — R109 Unspecified abdominal pain: Secondary | ICD-10-CM | POA: Diagnosis not present

## 2023-07-23 DIAGNOSIS — R195 Other fecal abnormalities: Secondary | ICD-10-CM

## 2023-07-23 DIAGNOSIS — K219 Gastro-esophageal reflux disease without esophagitis: Secondary | ICD-10-CM | POA: Diagnosis not present

## 2023-07-23 DIAGNOSIS — R152 Fecal urgency: Secondary | ICD-10-CM | POA: Diagnosis not present

## 2023-07-23 LAB — C-REACTIVE PROTEIN: CRP: <1 mg/dL (ref 0.5–20.0)

## 2023-07-23 LAB — SEDIMENTATION RATE: Sed Rate: 4 mm/h (ref 0–30)

## 2023-07-23 MED ORDER — LEVOTHYROXINE SODIUM 150 MCG PO TABS
ORAL_TABLET | ORAL | 0 refills | Status: DC
  Filled 2023-07-23: qty 86, 90d supply, fill #0

## 2023-07-23 MED ORDER — NA SULFATE-K SULFATE-MG SULF 17.5-3.13-1.6 GM/177ML PO SOLN
1.0000 | ORAL | 0 refills | Status: DC
  Filled 2023-07-23: qty 354, 1d supply, fill #0

## 2023-07-23 NOTE — Patient Instructions (Signed)
_______________________________________________________  If your blood pressure at your visit was 140/90 or greater, please contact your primary care physician to follow up on this.  _______________________________________________________  If you are age 68 or older, your body mass index should be between 23-30. Your Body mass index is 28.98 kg/m. If this is out of the aforementioned range listed, please consider follow up with your Primary Care Provider.  If you are age 53 or younger, your body mass index should be between 19-25. Your Body mass index is 28.98 kg/m. If this is out of the aformentioned range listed, please consider follow up with your Primary Care Provider.  ________________________________________________________  The [facility] providers would like to encourage you to use [facility] to communicate with providers for non-urgent requests or questions.  Due to long hold times on the telephone, sending your provider a message by [facility] may be a faster and more efficient way to get a response.  Please allow 48 business hours for a response.  Please remember that this is for non-urgent requests.  _______________________________________________________  Your provider has requested that you go to the basement level for lab work before leaving today. Press "B" on the elevator. The lab is located at the first door on the left as you exit the elevator.  You have been scheduled for an endoscopy and colonoscopy. Please follow the written instructions given to you at your visit today.  Please pick up your prep supplies at the pharmacy within the next 1-3 days.  If you use inhalers (even only as needed), please bring them with you on the day of your procedure.  DO NOT TAKE 7 DAYS PRIOR TO TEST- Trulicity (dulaglutide) Ozempic, Wegovy (semaglutide) Mounjaro (tirzepatide) Bydureon Bcise (exanatide extended release)  DO NOT TAKE 1 DAY PRIOR TO YOUR TEST Rybelsus (semaglutide) Adlyxin  (lixisenatide) Victoza (liraglutide) Byetta (exanatide) ___________________________________________________________________________  Due to recent changes in healthcare laws, you may see the results of your imaging and laboratory studies on MyChart before your provider has had a chance to review them.  We understand that in some cases there may be results that are confusing or concerning to you. Not all laboratory results come back in the same time frame and the provider may be waiting for multiple results in order to interpret others.  Please give [place] 48 hours in order for your provider to thoroughly review all the results before contacting the office for clarification of your results.   It was a pleasure to see you today!  Vito Cirigliano, D.O.

## 2023-07-23 NOTE — Progress Notes (Signed)
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  Has had intermittent but somewhat progressive dysphagia for the last 6 months or so.  Prior to that, very rare episodes of dysphagia.  Now has symptoms every 1-2 weeks.  No odynophagia.  She does have a longstanding history of GERD, with index symptoms of regurgitation.  Has been taking omeprazole 20 mg daily for years, and will occasionally use a second dose or Pepcid on demand for breakthrough.  Did have an esophagram approximate 7-8 years ago when she was undergoing evaluation for Nissen which showed full column regurgitation.  Last EGD was about that same time.  She ultimately did not go through with Nissen fundoplication.  Separately, history of acute appendicitis with perforation in 01/2022.  Underwent emergent laparoscopic appendectomy.  Since then, has had lower GI symptoms to include abdominal cramping, tenesmus, loose stools, and episodic incontinence.  No hematochezia or melena.  No change with trial of probiotics, dietary modification.  Last colonoscopy was at age 65 and normal.  Family history notable for sister with metastatic gastric cancer.  Reviewed most recent labs from 04/2023: Normal CBC, CMP.  TSH 0.305.  A1c 5.9%.  -02/01/2022: CT A/P: Acute appendicitis with 2.9 x 2.0 cm collection of gas/fluid raising concern for perforation and 8 mm appendicolith.  Remainder of the GI tract was normal-appearing. - 02/01/2022: Laparoscopic appendectomy   Past Medical History:  Diagnosis Date   Anxiety    Phreesia 07/27/2020   Arthritis    Phreesia 07/27/2020   Breast nodule    Cataract    Phreesia 07/27/2020   DDD (degenerative disc disease), cervical    Dysthymia    Generalized OA    GERD  (gastroesophageal reflux disease)    Hallux rigidus    Heart murmur    beneign   Hyperlipidemia    Hypertension    OSA on CPAP    Does not use a cpap   Pseudophakia    Sleep apnea    Phreesia 07/27/2020   Thyroid disease    Uterine fibroid      Past Surgical History:  Procedure Laterality Date   BUNIONECTOMY     CATARACT EXTRACTION  2016   EYE SURGERY     FRACTURE SURGERY N/A    Phreesia 07/27/2020   fusion   2015   first MTP B   HARDWARE REMOVAL Left 06/15/2017   Procedure: Left Great Toe Hardware Removal;  Surgeon: Nadara Mustard, MD;  Location: [facility] OR;  Service: Orthopedics;  Laterality: Left;   LAPAROSCOPIC APPENDECTOMY N/A 02/01/2022   Procedure: APPENDECTOMY LAPAROSCOPIC;  Surgeon: Berna Bue, MD;  Location: [facility] OR;  Service: General;  Laterality: N/A;   MYOMECTOMY  1992   ORIF ANKLE FRACTURE Left 01/16/2023   Procedure: OPEN REDUCTION INTERNAL FIXATION (ORIF) LEFT ANKLE FRACTURE AND SYNDESMOSIS;  Surgeon: Terance Hart, MD;  Location[facility] OR;  Service: Orthopedics;  Laterality: Left;   ORIF FINGER FRACTURE  2006   PAROTIDECTOMY Right 05/14/2020   Procedure: RIGHT SUPERFICIAL PAROTIDECTOMY;  Surgeon: Osborn Coho, MD;  Location: [facility] OR;  Service: ENT;  Laterality: Right;  REQUESTING RNFA   THYROIDECTOMY  2005   TOENAIL  EXCISION Right 06/15/2017   Procedure: Right Great Toe Excision Lateral Border Nail;  Surgeon: Nadara Mustard, MD;  Location: [facility] OR;  Service: Orthopedics;  Laterality: Right;   TONSILLECTOMY  1960   Family History  Problem Relation Age of Onset   Arthritis Mother    Depression Mother    COPD Father    Hearing loss Father    Heart disease Father    Hyperlipidemia Father    Hypertension Father    Kidney disease Father    Alcohol abuse Father    Depression Sister    Alcohol abuse Sister    Depression Sister    Heart disease Sister    Hyperlipidemia Sister    Depression Sister    Heart disease Sister    Hyperlipidemia Sister    Heart  disease Maternal Grandmother        CHF   COPD Maternal Grandfather    Alcohol abuse Paternal Grandmother    Colon cancer Neg Hx    Stomach cancer Neg Hx    Esophageal varices Neg Hx    Social History   Tobacco Use   Smoking status: Never   Smokeless tobacco: Never  Vaping Use   Vaping status: Never Used  Substance Use Topics   Alcohol use: Yes    Alcohol/week: 6.0 standard drinks of alcohol    Types: 6 Glasses of wine per week    Comment: 1-2 glasses of wine a day   Drug use: Never   Current Outpatient Medications  Medication Sig Dispense Refill   acetaminophen (TYLENOL) 500 MG tablet Take 1,000 mg by mouth every 6 (six) hours as needed for mild pain or headache.      atenolol (TENORMIN) 25 MG tablet Take 1 tablet (25 mg total) by mouth daily. Take in addition to the 50 mg dose once daily. 90 tablet 3   atenolol (TENORMIN) 50 MG tablet Take 1 tablet (50 mg total) by mouth daily. 90 tablet 3   DULoxetine (CYMBALTA) 60 MG capsule Take 1 capsule by mouth daily.     estradiol (ESTRACE VAGINAL) 0.1 MG/GM vaginal cream Place 1 Applicatorful vaginally at bedtime. (Patient taking differently: Place 1 Applicatorful vaginally at bedtime. 3 times a week) 42.5 g 12   levothyroxine (SYNTHROID) 150 MCG tablet Take 1 tablet ( ) daily for 6 days per week then take 1/2 tablet ( ) on day 7. 90 tablet 0   omeprazole (PRILOSEC) 20 MG capsule Take 1 capsule (20 mg total) by mouth daily. 90 capsule 3   rosuvastatin (CRESTOR) 20 MG tablet Take 1 tablet (20 mg total) by mouth daily. 90 tablet 3   valsartan (DIOVAN) 40 MG tablet Take 1 tablet (40 mg total) by mouth daily. 90 tablet 3   aspirin (BAYER ASPIRIN) 325 MG tablet Take 1 tablet by mouth for 30 DAYS for blood clot prevention 30 tablet 0   Naltrexone-buPROPion HCl ER (CONTRAVE) 8-90 MG TB12 Take 1 tab daily for week 1, then 1 tab twice daily for week 2, then 2 tabs  in the morning and 1 tab in the evening for week 3, then 2 tabs twice daily  thereafter. 120 tablet 0   naproxen sodium (ALEVE) 220 MG tablet Take 440 mg by mouth 2 (two) times daily as needed (arthritis pain). (Patient not taking: Reported on 07/23/2023)     Propylene Glycol (SYSTANE COMPLETE OP) Place 1 drop into both eyes daily as needed (irritation). (Patient not taking: Reported on 07/23/2023)     Tretinoin (  RETIN-A EX) Apply 1 application. topically 2 (two) times a week. (Patient not taking: Reported on 07/23/2023)     Tretinoin, Facial Wrinkles, (RENOVA) 0.02 % CREA Apply topically.     No current facility-administered medications for this visit.   Allergies  Allergen Reactions   Sulfamethoxazole-Trimethoprim Other (See Comments)    [name] Rash   Shingrix [Zoster Vac Recomb Adjuvanted] Other (See Comments)    Localized reaction with fever, hypotension, vertigo   Vioxx [Rofecoxib] Other (See Comments)    Photodermatitis    Hydrochlorothiazide Other (See Comments)    Excessive sweating     Review of Systems: All systems reviewed and negative except where noted in HPI.     Physical Exam:    Wt Readings from Last 3 Encounters:  07/23/23 185 lb (83.9 kg)  04/20/23 182 lb 1.3 oz (82.6 kg)  01/16/23 175 lb (79.4 kg)    BP 126/80   Pulse 77   Ht 5\' 7"  (1.702 m)   Wt 185 lb (83.9 kg)   BMI 28.98 kg/m  Constitutional:  Pleasant, in no acute distress. Psychiatric: Normal mood and affect. Behavior is normal. Neurological: Alert and oriented to person place and time. Skin: Skin is warm and dry. No rashes noted.   ASSESSMENT AND PLAN;   1) Dysphagia - EGD to evaluate for mucosal/luminal pathology, narrowing, stricture with esophageal dilation and/or biopsies as appropriate  2) GERD - Evaluate for erosive esophagitis, LES laxity, hiatal hernia time of EGD - Continue omeprazole 20 mg daily - Continue Pepcid on demand for breakthrough - Continue antireflux lifestyle/dietary modifications  3) Change in bowel habits 4) Abdominal  cramping 5) Fecal incontinence/urgency 6) Tenesmus Symptoms started after acute appendicitis with perforation requiring emergent appendectomy, prolonged ABX.  No response to trial of probiotics and dietary modification.  DDx includes post antibiotic diarrhea, SIBO, IBD, postinfectious IBS, etc. - Check ESR, CRP - Check fecal calprotectin, pancreatic elastase - Colonoscopy with random and directed biopsies - Evaluate for UGI etiology at time of EGD as above, with random and directed biopsies - If evaluation unrevealing, will trial course of rifaximin   The indications, risks, and benefits of EGD and colonoscopy were explained to the patient in detail. Risks include but are not limited to bleeding, perforation, adverse reaction to medications, and cardiopulmonary compromise. Sequelae include but are not limited to the possibility of surgery, hospitalization, and mortality. The patient verbalized understanding and wished to proceed. All questions answered, referred to scheduler and bowel prep ordered. Further recommendations pending results of the exam.     Shellia Cleverly, DO, FACG  07/23/2023, 10:21 AM   Christen Butter, NP

## 2023-08-12 ENCOUNTER — Encounter: Payer: Self-pay | Admitting: Gastroenterology

## 2023-08-12 ENCOUNTER — Other Ambulatory Visit: Payer: Self-pay | Admitting: Medical-Surgical

## 2023-08-15 ENCOUNTER — Encounter: Payer: Self-pay | Admitting: Primary Care

## 2023-08-15 ENCOUNTER — Ambulatory Visit: Payer: Commercial Managed Care - PPO | Admitting: Primary Care

## 2023-08-15 VITALS — BP 132/74 | HR 71 | Temp 96.8°F | Ht 67.0 in | Wt 181.6 lb

## 2023-08-15 DIAGNOSIS — Z8669 Personal history of other diseases of the nervous system and sense organs: Secondary | ICD-10-CM | POA: Diagnosis not present

## 2023-08-15 DIAGNOSIS — R0681 Apnea, not elsewhere classified: Secondary | ICD-10-CM | POA: Diagnosis not present

## 2023-08-15 NOTE — Patient Instructions (Addendum)
Sleep apnea is defined as period of 10 seconds or longer when you stop breathing at night. This can happen multiple times a night. Dx sleep apnea is when this occurs more than 5 times an hour.    Mild OSA 5-15 apneic events an hour Moderate OSA 15-30 apneic events an hour Severe OSA > 30 apneic events an hour   Untreated sleep apnea puts you at higher risk for cardiac arrhythmias, pulmonary HTN, stroke and diabetes  Treatment options include weight loss, side sleeping position, oral appliance, CPAP therapy or referral to ENT for possible surgical options/ Inspire device    Recommendations: Focus on side sleeping position or elevate head with wedge pillow 30 degrees Work on weight loss efforts if able  Avoid excessive alcohol prior to bedtime as this can worsen underlying OSA  Do not drive if experiencing excessive daytime sleepiness of fatigue    Orders: Home sleep study re: loud snoring (ordered)   Follow-up: Please call to schedule follow-up 1-2 weeks after completing home sleep study to review results and treatment if needed (can be virtual)

## 2023-08-15 NOTE — Progress Notes (Signed)
@Patient  ID: Rica Mast, female    DOB: 11-Jan-1956, 67 y.o.   MRN: [id number]  No chief complaint on file.   Referring provider: Christen Butter, NP  HPI: 67 year old female. PMH significant for benign parathyroid adenoma, breast nodule, GERD, OA, HLD, HTN, hypothyroidism, depression. Former patient of Dr. Craige Cotta.   08/15/2023 Discussed the use of AI scribe software for clinical note transcription with the patient, who gave verbal consent to proceed.  History of Present Illness   Patient has a history of severe sleep apnea and restless leg syndrome, presents for re-evaluation of her sleep disorder. She was initially diagnosed with sleep apnea over a decade ago, following a business trip with her sister, who noticed her severe apneic episodes. The patient underwent a sleep study, which confirmed severe sleep apnea with an index of 43 and significant restless leg syndrome. She was prescribed CPAP therapy, which she used for several years but found it disruptive due to her frequent nocturnal movements and did not notice any improvement in her sleep quality. Since discontinuing use she has continued to experience poor sleep quality, frequent awakenings, and dry mouth upon waking. She also reports a long history of difficulty falling and staying asleep, with frequent nocturnal movements.  The patient has tried various medications for sleep and restless leg syndrome, including gabapentin, Lyrica, and multiple sedatives, but none have provided significant relief. She has also attempted to manage her sleep issues with alcohol in the past but recognizes this is not a healthy or effective solution.  The patient has a history of hypertension and high cholesterol but had a cardiac score of zero last year. She has gained weight over the past year due to an appendectomy and an ankle dislocation, which limited her mobility for eight weeks. She acknowledges that the weight gain may be exacerbating her sleep apnea,  but notes that she had sleep apnea even when she was at a lower weight. She is actively trying to lose weight.  The patient is open to re-evaluating her sleep apnea and potential treatment options, but is hesitant about surgical interventions such as the Inspire hypoglossal nerve stimulator.      Allergies  Allergen Reactions   Sulfamethoxazole-Trimethoprim Other (See Comments)    [name] Rash   Shingrix [Zoster Vac Recomb Adjuvanted] Other (See Comments)    Localized reaction with fever, hypotension, vertigo   Vioxx [Rofecoxib] Other (See Comments)    Photodermatitis    Hydrochlorothiazide Other (See Comments)    Excessive sweating    Immunization History  Administered Date(s) Administered   Fluad Quad(high Dose 65+) 06/18/2020, 05/31/2021, 06/29/2022   Influenza-Unspecified 06/12/2019, 07/11/2019   PFIZER(Purple Top)SARS-COV-2 Vaccination 09/09/2019, 09/30/2019, 06/11/2020   Pfizer Covid-19 Vaccine Bivalent Booster 55yrs & up 05/30/2021   Pfizer(Comirnaty)Fall Seasonal Vaccine 12 years and older 06/29/2022   Pneumococcal Conjugate-13 03/24/2020   Pneumococcal Polysaccharide-23 06/20/2022   Tdap 04/08/2019   Zoster Recombinant(Shingrix) 10/17/2017    Past Medical History:  Diagnosis Date   Anxiety    Phreesia 07/27/2020   Arthritis    [employer]reesia 07/27/2020   Breast nodule    Cataract    [employer]reesia 07/27/2020   DDD (degenerative disc disease), cervical    Dysthymia    Generalized OA    GERD (gastroesophageal reflux disease)    Hallux rigidus    Heart murmur    beneign   Hyperlipidemia    Hypertension    OSA on CPAP    Does not use a cpap   Pseudophakia  Sleep apnea    Phreesia 07/27/2020   Thyroid disease    Uterine fibroid     Tobacco History: Social History   Tobacco Use  Smoking Status Never  Smokeless Tobacco Never   Counseling given: Not Answered   Outpatient Medications Prior to Visit  Medication Sig Dispense Refill   acetaminophen  (TYLENOL) 500 MG tablet Take 1,000 mg by mouth every 6 (six) hours as needed for mild pain or headache.      aspirin (BAYER ASPIRIN) 325 MG tablet Take 1 tablet by mouth for 30 DAYS for blood clot prevention 30 tablet 0   atenolol (TENORMIN) 25 MG tablet Take 1 tablet (25 mg total) by mouth daily. Take in addition to the 50 mg dose once daily. 90 tablet 3   atenolol (TENORMIN) 50 MG tablet Take 1 tablet (50 mg total) by mouth daily. 90 tablet 3   DULoxetine (CYMBALTA) 60 MG capsule Take 1 capsule by mouth daily.     estradiol (ESTRACE VAGINAL) 0.1 MG/GM vaginal cream Place 1 Applicatorful vaginally at bedtime. (Patient taking differently: Place 1 Applicatorful vaginally at bedtime. 3 times a week) 42.5 g 12   levothyroxine (SYNTHROID) 150 MCG tablet Take 1 tablet ( ) daily for 6 days per week then take 1/2 tablet ( ) on day 7. 86 tablet 0   Na Sulfate-K Sulfate-Mg Sulf (SUPREP BOWEL PREP KIT) 17.5-3.13-1.6 GM/177ML SOLN Take 1 kit by mouth as directed. 354 mL 0   Naltrexone-buPROPion HCl ER (CONTRAVE) 8-90 MG TB12 Take 1 tab daily for week 1, then 1 tab twice daily for week 2, then 2 tabs  in the morning and 1 tab in the evening for week 3, then 2 tabs twice daily thereafter. 120 tablet 0   naproxen sodium (ALEVE) 220 MG tablet Take 440 mg by mouth 2 (two) times daily as needed (arthritis pain). (Patient not taking: Reported on 07/23/2023)     omeprazole (PRILOSEC) 20 MG capsule Take 1 capsule (20 mg total) by mouth daily. 90 capsule 3   Propylene Glycol (SYSTANE COMPLETE OP) Place 1 drop into both eyes daily as needed (irritation). (Patient not taking: Reported on 07/23/2023)     rosuvastatin (CRESTOR) 20 MG tablet Take 1 tablet (20 mg total) by mouth daily. 90 tablet 3   Tretinoin (RETIN-A EX) Apply 1 application. topically 2 (two) times a week. (Patient not taking: Reported on 07/23/2023)     Tretinoin, Facial Wrinkles, (RENOVA) 0.02 % CREA Apply topically.     valsartan (DIOVAN) 40 MG  tablet Take 1 tablet (40 mg total) by mouth daily. 90 tablet 3   No facility-administered medications prior to visit.   Review of Systems  Review of Systems  Constitutional:  Negative for fatigue.  HENT:  Positive for sneezing.   Respiratory:  Positive for apnea.   Cardiovascular: Negative.   Psychiatric/Behavioral:  Positive for sleep disturbance.    Physical Exam  There were no vitals taken for this visit. Physical Exam Constitutional:      Appearance: Normal appearance.  HENT:     Head: Normocephalic and atraumatic.     Mouth/Throat:     Pharynx: Oropharynx is clear.     Comments: Mallampati class I Cardiovascular:     Rate and Rhythm: Normal rate and regular rhythm.     Heart sounds: No murmur heard. Pulmonary:     Effort: Pulmonary effort is normal.     Breath sounds: Normal breath sounds.  Musculoskeletal:     Cervical back: Normal range  of motion and neck supple.  Skin:    General: Skin is warm and dry.  Neurological:     General: No focal deficit present.     Mental Status: She is alert and oriented to person, place, and time. Mental status is at baseline.  Psychiatric:        Mood and Affect: Mood normal.        Behavior: Behavior normal.        Thought Content: Thought content normal.        Judgment: Judgment normal.      Lab Results:  CBC    Component Value Date/Time   WBC 4.8 04/19/2023 0830   WBC 4.3 01/16/2023 0857   RBC 4.34 04/19/2023 0830   RBC 3.65 (L) 01/16/2023 0857   HGB 12.9 04/19/2023 0830   HCT 40.0 04/19/2023 0830   PLT 291 04/19/2023 0830   MCV 92 04/19/2023 0830   MCH 29.7 04/19/2023 0830   MCH 30.7 01/16/2023 0857   MCHC 32.3 04/19/2023 0830   MCHC 31.5 01/16/2023 0857   RDW 13.5 04/19/2023 0830   LYMPHSABS 2.0 04/19/2023 0830   MONOABS 1.1 (H) 02/01/2022 0426   EOSABS 0.3 04/19/2023 0830   BASOSABS 0.1 04/19/2023 0830    BMET    Component Value Date/Time   NA 142 04/19/2023 0830   K 4.4 04/19/2023 0830   CL 103  04/19/2023 0830   CO2 21 04/19/2023 0830   GLUCOSE 104 (H) 04/19/2023 0830   GLUCOSE 105 (H) 01/16/2023 0857   BUN 18 04/19/2023 0830   CREATININE 0.90 04/19/2023 0830   CREATININE 0.99 03/23/2022 1554   CALCIUM 9.5 04/19/2023 0830   GFRNONAA >60 01/16/2023 0857   GFRAA >60 05/12/2020 0844    BNP No results found for: "BNP"  ProBNP No results found for: "PROBNP"  Imaging: No results found.   Assessment & Plan:   1. Hx of sleep apnea - Home sleep test; Future  2. Witnessed episode of apnea - Home sleep test; Future     Obstructive Sleep Apnea (OSA) OSA diagnosed over 10 years ago. Patient has been off CPAP therapy for over 5 years due to discomfort and lack of perceived benefit. Patient reports poor sleep quality, frequent awakenings, and dry mouth suggestive of ongoing OSA. -Order home sleep study to reassess severity of OSA. -Discuss potential benefits of newer CPAP machines and masks. -Discuss alternative treatment options including oral appliances and Inspire hypoglossal nerve stimulator, though patient currently leans against surgical intervention.  Restless Leg Syndrome (RLS) Patient reports frequent movements during sleep, suggestive of RLS. Previous trials of gabapentin was not beneficial. -Consider trial of Requip for RLS management, she will discuss medication with her primary care provider   Insomnia Patient reports difficulty falling asleep and frequent awakenings. Previous trials of multiple sleep aids including Ambien, Dalmane, Restoril, trazodone, Benadryl, Unisom, and Belsomra were not beneficial. -Consider trial of Atarax or Lunesta for insomnia management.  Hypertension and Hyperlipidemia -Continue current management as directed by primary care provider.  Weight Gain Patient reports recent weight gain due to decreased activity following appendectomy and ankle dislocation. -Encourage return to regular exercise as tolerated.      Glenford Bayley,  NP 08/15/2023

## 2023-08-17 ENCOUNTER — Encounter: Payer: Self-pay | Admitting: Gastroenterology

## 2023-08-20 ENCOUNTER — Other Ambulatory Visit (HOSPITAL_COMMUNITY): Payer: Self-pay

## 2023-08-20 ENCOUNTER — Other Ambulatory Visit: Payer: Self-pay

## 2023-08-20 MED ORDER — OMEPRAZOLE 20 MG PO CPDR
20.0000 mg | DELAYED_RELEASE_CAPSULE | Freq: Every day | ORAL | 0 refills | Status: DC
  Filled 2023-08-20: qty 90, 90d supply, fill #0

## 2023-08-24 ENCOUNTER — Other Ambulatory Visit: Payer: Commercial Managed Care - PPO

## 2023-08-24 DIAGNOSIS — R195 Other fecal abnormalities: Secondary | ICD-10-CM | POA: Diagnosis not present

## 2023-08-24 DIAGNOSIS — K219 Gastro-esophageal reflux disease without esophagitis: Secondary | ICD-10-CM | POA: Diagnosis not present

## 2023-08-24 DIAGNOSIS — R1310 Dysphagia, unspecified: Secondary | ICD-10-CM

## 2023-08-24 DIAGNOSIS — R197 Diarrhea, unspecified: Secondary | ICD-10-CM

## 2023-08-28 ENCOUNTER — Other Ambulatory Visit: Payer: Self-pay | Admitting: Medical-Surgical

## 2023-08-29 ENCOUNTER — Encounter: Payer: Self-pay | Admitting: Gastroenterology

## 2023-08-29 ENCOUNTER — Other Ambulatory Visit: Payer: Self-pay

## 2023-08-29 ENCOUNTER — Other Ambulatory Visit (HOSPITAL_COMMUNITY): Payer: Self-pay

## 2023-08-29 ENCOUNTER — Ambulatory Visit: Payer: Commercial Managed Care - PPO | Admitting: Gastroenterology

## 2023-08-29 VITALS — BP 172/84 | HR 63 | Temp 97.9°F | Resp 11 | Ht 67.0 in | Wt 185.0 lb

## 2023-08-29 DIAGNOSIS — R152 Fecal urgency: Secondary | ICD-10-CM

## 2023-08-29 DIAGNOSIS — K2970 Gastritis, unspecified, without bleeding: Secondary | ICD-10-CM

## 2023-08-29 DIAGNOSIS — D125 Benign neoplasm of sigmoid colon: Secondary | ICD-10-CM | POA: Diagnosis not present

## 2023-08-29 DIAGNOSIS — K219 Gastro-esophageal reflux disease without esophagitis: Secondary | ICD-10-CM

## 2023-08-29 DIAGNOSIS — R1310 Dysphagia, unspecified: Secondary | ICD-10-CM

## 2023-08-29 DIAGNOSIS — R195 Other fecal abnormalities: Secondary | ICD-10-CM

## 2023-08-29 DIAGNOSIS — K5730 Diverticulosis of large intestine without perforation or abscess without bleeding: Secondary | ICD-10-CM | POA: Diagnosis not present

## 2023-08-29 DIAGNOSIS — Z800 Family history of malignant neoplasm of digestive organs: Secondary | ICD-10-CM | POA: Diagnosis not present

## 2023-08-29 DIAGNOSIS — K2950 Unspecified chronic gastritis without bleeding: Secondary | ICD-10-CM | POA: Diagnosis not present

## 2023-08-29 DIAGNOSIS — R197 Diarrhea, unspecified: Secondary | ICD-10-CM

## 2023-08-29 DIAGNOSIS — R109 Unspecified abdominal pain: Secondary | ICD-10-CM

## 2023-08-29 DIAGNOSIS — R159 Full incontinence of feces: Secondary | ICD-10-CM

## 2023-08-29 DIAGNOSIS — D122 Benign neoplasm of ascending colon: Secondary | ICD-10-CM

## 2023-08-29 DIAGNOSIS — G4733 Obstructive sleep apnea (adult) (pediatric): Secondary | ICD-10-CM | POA: Diagnosis not present

## 2023-08-29 MED ORDER — SODIUM CHLORIDE 0.9 % IV SOLN: 500.0000 mL | Freq: Once | INTRAVENOUS | Status: DC

## 2023-08-29 MED ORDER — RENOVA 0.02 % EX CREA
1.0000 | TOPICAL_CREAM | Freq: Every day | CUTANEOUS | 5 refills | Status: DC
  Filled 2023-08-29 – 2023-11-29 (×3): qty 60, 90d supply, fill #0

## 2023-08-29 NOTE — Progress Notes (Signed)
Report to PACU, RN, vss, BBS= Clear.  

## 2023-08-29 NOTE — Op Note (Signed)
[facility] Patient Name: Destiny Graham Procedure Date: 08/29/2023 2:38 PM MRN: [id number] Endoscopist: Doristine Locks , MD, [id number] Age: 67 Referring MD:  Date of Birth: July 10, 1956 Gender: Female Account #: [id number] Procedure:                Colonoscopy Indications:              Change in bowel habits, Diarrhea, Abdominal                            cramping, Fecal incontinence Medicines:                Monitored Anesthesia Care Procedure:                Pre-Anesthesia Assessment:                           - Prior to the procedure, a History and Physical                            was performed, and patient medications and                            allergies were reviewed. The patient's tolerance of                            previous anesthesia was also reviewed. The risks                            and benefits of the procedure and the sedation                            options and risks were discussed with the patient.                            All questions were answered, and informed consent                            was obtained. Prior Anticoagulants: The patient has                            taken no anticoagulant or antiplatelet agents. ASA                            Grade Assessment: II - A patient with mild systemic                            disease. After reviewing the risks and benefits,                            the patient was deemed in satisfactory condition to                            undergo the procedure.  After obtaining informed consent, the colonoscope                            was passed under direct vision. Throughout the                            procedure, the patient's blood pressure, pulse, and                            oxygen saturations were monitored continuously. The                            CF [id number] #[id number] was introduced through the anus                            and advanced to the the  terminal ileum. The                            colonoscopy was technically difficult due to                            significant abdominal motion with respirations. The                            patient tolerated the procedure well. The quality                            of the bowel preparation was good. The terminal                            ileum, ileocecal valve, appendiceal orifice, and                            rectum were photographed. Scope In: 2:57:53 PM Scope Out: 3:32:45 PM Scope Withdrawal Time: 0 hours 29 minutes 35 seconds  Total Procedure Duration: 0 hours 34 minutes 52 seconds  Findings:                 The perianal and digital rectal examinations were                            normal.                           A 20 mm polyp was found in the ascending colon. The                            polyp was flat. Attempted to remove the polyp with                            a saline injection-lift technique using a hot                            snare. Polyp resection was incomplete due to  central tethering of the polyp. Despite saline                            injection, could not sufficiently lift the central                            part of the polyp to facilitate complete endoscopic                            mucosal resection. The resected tissue was                            retrieved. Area 1-2 cm distal to the polyp was                            tattooed with an injection of 1 mL of Spot (carbon                            black).                           A 5 mm polyp was found in the sigmoid colon. The                            polyp was sessile. The polyp was removed with a                            cold snare. Resection and retrieval were complete.                            Estimated blood loss was minimal.                           Multiple small-mouthed diverticula were found in                            the sigmoid colon,  descending colon, transverse                            colon and ascending colon.                           The mucosa was otherwise normal appearing                            throughout the entire colon. Biopsies for histology                            were taken with a cold forceps from the right colon                            and left colon for evaluation of microscopic  colitis. Estimated blood loss was minimal.                           The retroflexed view of the distal rectum and anal                            verge was normal and showed no anal or rectal                            abnormalities.                           The terminal ileum appeared normal. Complications:            No immediate complications. Estimated Blood Loss:     Estimated blood loss was minimal. Impression:               - One 20 mm polyp in the ascending colon. Attempted                            endoscopic mucosal resection using saline                            injection-lift and a hot snare, but the resection                            was ultimately incomplete due to central tethering                            of the polyp. The resected tissue retrieved.                            Tattooed.                           - One 5 mm polyp in the sigmoid colon, removed with                            a cold snare. Resected and retrieved.                           - Diverticulosis in the sigmoid colon, in the                            descending colon, in the transverse colon and in                            the ascending colon.                           - Normal mucosa in the entire examined colon.                            Biopsied.                           -  The distal rectum and anal verge are normal on                            retroflexion view.                           - The examined portion of the ileum was normal. Recommendation:           - Patient has a  contact number available for                            emergencies. The signs and symptoms of potential                            delayed complications were discussed with the                            patient. Return to normal activities tomorrow.                            Written discharge instructions were provided to the                            patient.                           - Resume previous diet.                           - Continue present medications.                           - Await pathology results.                           - Refer to a colo-rectal surgeon at appointment to                            be scheduled for discussion of surgical resection. Doristine Locks, MD 08/29/2023 3:46:17 PM

## 2023-08-29 NOTE — Op Note (Signed)
[facility] Patient Name: Destiny Graham Procedure Date: 08/29/2023 2:19 PM MRN: [id number] Endoscopist: Doristine Locks , MD, [id number] Age: 67 Referring MD:  Date of Birth: 03/30/1956 Gender: Female Account #: [id number] Procedure:                Upper GI endoscopy Indications:              Dysphagia, Suspected esophageal reflux,                            Regurgitation                           Family history of gastric cancer Medicines:                Monitored Anesthesia Care Procedure:                Pre-Anesthesia Assessment:                           - Prior to the procedure, a History and Physical                            was performed, and patient medications and                            allergies were reviewed. The patient's tolerance of                            previous anesthesia was also reviewed. The risks                            and benefits of the procedure and the sedation                            options and risks were discussed with the patient.                            All questions were answered, and informed consent                            was obtained. Prior Anticoagulants: The patient has                            taken no anticoagulant or antiplatelet agents. ASA                            Grade Assessment: II - A patient with mild systemic                            disease. After reviewing the risks and benefits,                            the patient was deemed in satisfactory condition to  undergo the procedure.                           After obtaining informed consent, the endoscope was                            passed under direct vision. Throughout the                            procedure, the patient's blood pressure, pulse, and                            oxygen saturations were monitored continuously. The                            GIF [id number] #[id number] was introduced through the                             mouth, and advanced to the second part of duodenum.                            The upper GI endoscopy was accomplished without                            difficulty. The patient tolerated the procedure                            well. Scope In: Scope Out: Findings:                 The examined esophagus was normal. A guidewire was                            placed and the scope was withdrawn. Dilation was                            performed with a Savary dilator with no resistance                            at 17 mm. The dilation site was examined following                            endoscope reinsertion and showed no bleeding,                            mucosal tear or perforation. Estimated blood loss:                            none.                           The Z-line was regular and was found 40 cm from the                            incisors.  Scattered minimal inflammation characterized by                            erythema was found in the gastric body and in the                            prepyloric region of the stomach. Biopsies were                            taken with a cold forceps for Helicobacter pylori                            testing. Estimated blood loss was minimal.                           The examined duodenum was normal. Biopsies were                            taken with a cold forceps for histology. Estimated                            blood loss was minimal. Complications:            No immediate complications. Estimated Blood Loss:     Estimated blood loss was minimal. Impression:               - Normal esophagus. Dilated with 17 mm Savary                            dilator.                           - Z-line regular, 40 cm from the incisors.                           - Minimal, non-uler gastritis. Biopsied.                           - Normal examined duodenum. Biopsied. Recommendation:           - Patient has a  contact number available for                            emergencies. The signs and symptoms of potential                            delayed complications were discussed with the                            patient. Return to normal activities tomorrow.                            Written discharge instructions were provided to the                            patient.                           -  Resume previous diet.                           - Continue present medications.                           - Await pathology results.                           - Perform a colonoscopy today. Doristine Locks, MD 08/29/2023 3:37:02 PM

## 2023-08-29 NOTE — Progress Notes (Signed)
Called to room to assist during endoscopic procedure.  Patient ID and intended procedure confirmed with present staff. Received instructions for my participation in the procedure from the performing physician.  

## 2023-08-29 NOTE — Progress Notes (Signed)
GASTROENTEROLOGY PROCEDURE H&P NOTE   Primary Care Physician: Christen Butter, NP    Reason for Procedure:  Dysphagia, GERD, regurgitation, change in bowel habits, diarrhea, abdominal cramping, fecal incontinence, family history of stomach cancer  Plan:    EGD, colonoscopy  Patient is appropriate for endoscopic procedure(s) in the ambulatory ([facility]) setting.  The nature of the procedure, as well as the risks, benefits, and alternatives were carefully and thoroughly reviewed with the patient. Ample time for discussion and questions allowed. The patient understood, was satisfied, and agreed to proceed.     HPI: Destiny Graham is a 67 y.o. female who presents for EGD and colonoscopy for evaluation of multiple GI symptoms, to include dysphagia, GERD, crustacean, family history of stomach cancer, along with changes in bowel habits with diarrhea, abdominal cramping, fecal incontinence starting after acute appendicitis with perforation 01/2022.  Currently treated with omeprazole 20 mg daily and Pepcid on demand for breakthrough.  Recent evaluation with normal ESR, CRP.  Fecal pancreatic elastase collected and pending.  Otherwise no changes in clinical history since initial office appointment on 07/23/2023.  Past Medical History:  Diagnosis Date   Anxiety    Phreesia 07/27/2020   Arthritis    Phreesia 07/27/2020   Breast nodule    Cataract    Phreesia 07/27/2020   DDD (degenerative disc disease), cervical    Dysthymia    Generalized OA    GERD (gastroesophageal reflux disease)    Hallux rigidus    Heart murmur    beneign   Hyperlipidemia    Hypertension    OSA on CPAP    Does not use a cpap   Pseudophakia    Sleep apnea    Phreesia 07/27/2020   Thyroid disease    Uterine fibroid     Past Surgical History:  Procedure Laterality Date   BUNIONECTOMY     CATARACT EXTRACTION  2016   EYE SURGERY     FRACTURE SURGERY N/A    Phreesia 07/27/2020   fusion   2015   first MTP B    HARDWARE REMOVAL Left 06/15/2017   Procedure: Left Great Toe Hardware Removal;  Surgeon: Nadara Mustard, MD;  Location: [facility] OR;  Service: Orthopedics;  Laterality: Left;   LAPAROSCOPIC APPENDECTOMY N/A 02/01/2022   Procedure: APPENDECTOMY LAPAROSCOPIC;  Surgeon: Berna Bue, MD;  Location: [facility] OR;  Service: General;  Laterality: N/A;   MYOMECTOMY  1992   ORIF ANKLE FRACTURE Left 01/16/2023   Procedure: OPEN REDUCTION INTERNAL FIXATION (ORIF) LEFT ANKLE FRACTURE AND SYNDESMOSIS;  Surgeon: Terance Hart, MD;  Location: [facility] OR;  Service: Orthopedics;  Laterality: Left;   ORIF FINGER FRACTURE  2006   PAROTIDECTOMY Right 05/14/2020   Procedure: RIGHT SUPERFICIAL PAROTIDECTOMY;  Surgeon: Osborn Coho, MD;  Location: [facility] OR;  Service: ENT;  Laterality: Right;  REQUESTING RNFA   THYROIDECTOMY  2005   TOENAIL EXCISION Right 06/15/2017   Procedure: Right Great Toe Excision Lateral Border Nail;  Surgeon: Nadara Mustard, MD;  Location: [facility] OR;  Service: Orthopedics;  Laterality: Right;   TONSILLECTOMY  1960    Prior to Admission medications   Medication Sig Start Date End Date Taking? Authorizing Provider  acetaminophen (TYLENOL) 500 MG tablet Take 1,000 mg by mouth every 6 (six) hours as needed for mild pain or headache.    Yes [provider]  atenolol (TENORMIN) 25 MG tablet Take 1 tablet (25 mg total) by mouth daily. Take in addition to the 50 mg dose  once daily. 06/18/23  Yes Christen Butter, NP  atenolol (TENORMIN) 50 MG tablet Take 1 tablet (50 mg total) by mouth daily. 04/20/23  Yes Christen Butter, NP  DULoxetine (CYMBALTA) 60 MG capsule Take 1 capsule by mouth daily. 11/29/21  Yes [provider]  levothyroxine (SYNTHROID) 150 MCG tablet Take 1 tablet ( ) daily for 6 days per week then take 1/2 tablet ( ) on day 7. 07/23/23  Yes Christen Butter, NP  omeprazole (PRILOSEC) 20 MG capsule Take 1 capsule (20 mg total) by mouth daily. 08/20/23  Yes Christen Butter, NP  Propylene Glycol  (SYSTANE COMPLETE OP) Place 1 drop into both eyes daily as needed (irritation).   Yes [provider]  rosuvastatin (CRESTOR) 20 MG tablet Take 1 tablet (20 mg total) by mouth daily. 09/08/22 09/14/23 Yes Christen Butter, NP  Tretinoin, Facial Wrinkles, (RENOVA) 0.02 % CREA Apply topically. 08/29/23  Yes Christen Butter, NP  valsartan (DIOVAN) 40 MG tablet Take 1 tablet (40 mg total) by mouth daily. 06/04/23  Yes Christen Butter, NP  estradiol (ESTRACE VAGINAL) 0.1 MG/GM vaginal cream Place 1 Applicatorful vaginally at bedtime. Patient taking differently: Place 1 Applicatorful vaginally at bedtime. 3 times a week 06/20/22   Christen Butter, NP  naproxen sodium (ALEVE) 220 MG tablet Take 440 mg by mouth 2 (two) times daily as needed (arthritis pain).    [provider]  Tretinoin (RETIN-A EX) Apply 1 application  topically 2 (two) times a week.    [provider]    Current Outpatient Medications  Medication Sig Dispense Refill   acetaminophen (TYLENOL) 500 MG tablet Take 1,000 mg by mouth every 6 (six) hours as needed for mild pain or headache.      atenolol (TENORMIN) 25 MG tablet Take 1 tablet (25 mg total) by mouth daily. Take in addition to the 50 mg dose once daily. 90 tablet 3   atenolol (TENORMIN) 50 MG tablet Take 1 tablet (50 mg total) by mouth daily. 90 tablet 3   DULoxetine (CYMBALTA) 60 MG capsule Take 1 capsule by mouth daily.     levothyroxine (SYNTHROID) 150 MCG tablet Take 1 tablet ( ) daily for 6 days per week then take 1/2 tablet ( ) on day 7. 86 tablet 0   omeprazole (PRILOSEC) 20 MG capsule Take 1 capsule (20 mg total) by mouth daily. 90 capsule 0   Propylene Glycol (SYSTANE COMPLETE OP) Place 1 drop into both eyes daily as needed (irritation).     rosuvastatin (CRESTOR) 20 MG tablet Take 1 tablet (20 mg total) by mouth daily. 90 tablet 3   Tretinoin, Facial Wrinkles, (RENOVA) 0.02 % CREA Apply topically. 60 g 5   valsartan (DIOVAN) 40 MG tablet Take 1 tablet  (40 mg total) by mouth daily. 90 tablet 3   estradiol (ESTRACE VAGINAL) 0.1 MG/GM vaginal cream Place 1 Applicatorful vaginally at bedtime. (Patient taking differently: Place 1 Applicatorful vaginally at bedtime. 3 times a week) 42.5 g 12   naproxen sodium (ALEVE) 220 MG tablet Take 440 mg by mouth 2 (two) times daily as needed (arthritis pain).     Tretinoin (RETIN-A EX) Apply 1 application  topically 2 (two) times a week.     Current Facility-Administered Medications  Medication Dose Route Frequency Provider Last Rate Last Admin   0.9 %  sodium chloride infusion  500 mL Intravenous Once Malisa Ruggiero V, DO        Allergies as of 08/29/2023 - Review Complete 08/29/2023  Allergen Reaction Noted  Sulfamethoxazole-trimethoprim Other (See Comments) 02/09/2022   Shingrix [zoster vac recomb adjuvanted] Other (See Comments) 10/23/2017   Vioxx [rofecoxib] Other (See Comments) 07/25/2016   Hydrochlorothiazide Other (See Comments) 05/01/2023    Family History  Problem Relation Age of Onset   Arthritis Mother    Depression Mother    COPD Father    Hearing loss Father    Heart disease Father    Hyperlipidemia Father    Hypertension Father    Kidney disease Father    Alcohol abuse Father    Depression Sister    Alcohol abuse Sister    Depression Sister    Heart disease Sister    Hyperlipidemia Sister    Depression Sister    Heart disease Sister    Hyperlipidemia Sister    Heart disease Maternal Grandmother        CHF   COPD Maternal Grandfather    Alcohol abuse Paternal Grandmother    Colon cancer Neg Hx    Stomach cancer Neg Hx    Esophageal varices Neg Hx     Social History   Socioeconomic History   Marital status: Divorced    Spouse name: Not on file   Number of children: 2   Years of education: Not on file   Highest education level: Not on file  Occupational History   Not on file  Tobacco Use   Smoking status: Never   Smokeless tobacco: Never  Vaping Use    Vaping status: Never Used  Substance and Sexual Activity   Alcohol use: Yes    Alcohol/week: 6.0 standard drinks of alcohol    Types: 6 Glasses of wine per week    Comment: 1-2 glasses of wine a day   Drug use: Never   Sexual activity: Not Currently  Other Topics Concern   Not on file  Social History Narrative   Not on file   Social Drivers of Health   Financial Resource Strain: Not on file  Food Insecurity: Not on file  Transportation Needs: Not on file  Physical Activity: Not on file  Stress: Not on file  Social Connections: Not on file  Intimate Partner Violence: Not on file    Physical Exam: Vital signs in last 24 hours: @BP  (!) 152/97   Pulse 67   Temp 97.9 F (36.6 C)   Ht 5\' 7"  (1.702 m)   Wt 185 lb (83.9 kg)   SpO2 96%   BMI 28.98 kg/m  GEN: NAD EYE: Sclerae anicteric ENT: MMM CV: Non-tachycardic Pulm: CTA b/l GI: Soft, NT/ND NEURO:  Alert & Oriented x 3   Doristine Locks, DO  Gastroenterology   08/29/2023 2:35 PM

## 2023-08-29 NOTE — Progress Notes (Signed)
Pt's states no medical or surgical changes since previsit or office visit. 

## 2023-08-29 NOTE — Patient Instructions (Signed)
Educational handout provided to patient related to Polyps and Diverticulosis  Resume previous diet  Continue present medications  Awaiting pathology results  Referral to colo-rectal surgeon   YOU HAD AN ENDOSCOPIC PROCEDURE TODAY AT THE [facility]:   Refer to the procedure report that was given to you for any specific questions about what was found during the examination.  If the procedure report does not answer your questions, please call your gastroenterologist to clarify.  If you requested that your care partner not be given the details of your procedure findings, then the procedure report has been included in a sealed envelope for you to review at your convenience later.  YOU SHOULD EXPECT: Some feelings of bloating in the abdomen. Passage of more gas than usual.  Walking can help get rid of the air that was put into your GI tract during the procedure and reduce the bloating. If you had a lower endoscopy (such as a colonoscopy or flexible sigmoidoscopy) you may notice spotting of blood in your stool or on the toilet paper. If you underwent a bowel prep for your procedure, you may not have a normal bowel movement for a few days.  Please Note:  You might notice some irritation and congestion in your nose or some drainage.  This is from the oxygen used during your procedure.  There is no need for concern and it should clear up in a day or so.  SYMPTOMS TO REPORT IMMEDIATELY:  Following lower endoscopy (colonoscopy or flexible sigmoidoscopy):  Excessive amounts of blood in the stool  Significant tenderness or worsening of abdominal pains  Swelling of the abdomen that is new, acute  Fever of 100F or higher  Following upper endoscopy (EGD)  Vomiting of blood or coffee ground material  New chest pain or pain under the shoulder blades  Painful or persistently difficult swallowing  New shortness of breath  Fever of 100F or higher  Black, tarry-looking stools  For urgent or  emergent issues, a gastroenterologist can be reached at any hour by calling [phone]. Do not use MyChart messaging for urgent concerns.    DIET:  We do recommend a small meal at first, but then you may proceed to your regular diet.  Drink plenty of fluids but you should avoid alcoholic beverages for 24 hours.  ACTIVITY:  You should plan to take it easy for the rest of today and you should NOT DRIVE or use heavy machinery until tomorrow (because of the sedation medicines used during the test).    FOLLOW UP: Our staff will call the number listed on your records the next business day following your procedure.  We will call around 7:15- 8:00 am to check on you and address any questions or concerns that you may have regarding the information given to you following your procedure. If we do not reach you, we will leave a message.     If any biopsies were taken you will be contacted by phone or by letter within the next 1-3 weeks.  Please call us at [phone] if you have not heard about the biopsies in 3 weeks.    SIGNATURES/CONFIDENTIALITY: You and/or your care partner have signed paperwork which will be entered into your electronic medical record.  These signatures attest to the fact that that the information above on your After Visit Summary has been reviewed and is understood.  Full responsibility of the confidentiality of this discharge information lies with you and/or your care-partner.

## 2023-08-30 ENCOUNTER — Telehealth: Payer: Self-pay | Admitting: *Deleted

## 2023-08-30 NOTE — Telephone Encounter (Signed)
  Follow up Call-     08/29/2023    2:12 PM  Call back number  Post procedure Call Back phone  # [phone]  Permission to leave phone message Yes     Patient questions:  Do you have a fever, pain , or abdominal swelling? No. Pain Score  0 *  Have you tolerated food without any problems? Yes.    Have you been able to return to your normal activities? Yes.    Do you have any questions about your discharge instructions: Diet   No. Medications  No. Follow up visit  No.  Do you have questions or concerns about your Care? No.  Actions: * If pain score is 4 or above: No action needed, pain <4.

## 2023-08-31 ENCOUNTER — Encounter: Payer: Self-pay | Admitting: Medical-Surgical

## 2023-08-31 LAB — PANCREATIC ELASTASE, FECAL: Pancreatic Elastase-1, Stool: >800 ug/g (ref >200)

## 2023-08-31 MED ORDER — ONDANSETRON 8 MG PO TBDP: 8.0000 mg | ORAL_TABLET | Freq: Three times a day (TID) | ORAL | 3 refills | Status: AC | PRN

## 2023-09-03 LAB — SURGICAL PATHOLOGY

## 2023-09-10 ENCOUNTER — Telehealth: Payer: Self-pay

## 2023-09-10 NOTE — Telephone Encounter (Signed)
Per Danielle Rankin at [facility] this patient is scheduled for a new patient consult on 09-18-23 at 9:40am.

## 2023-09-17 ENCOUNTER — Encounter: Payer: Self-pay | Admitting: Medical-Surgical

## 2023-09-17 ENCOUNTER — Other Ambulatory Visit (HOSPITAL_COMMUNITY): Payer: Self-pay

## 2023-09-17 ENCOUNTER — Other Ambulatory Visit: Payer: Self-pay | Admitting: Medical-Surgical

## 2023-09-17 DIAGNOSIS — Z8669 Personal history of other diseases of the nervous system and sense organs: Secondary | ICD-10-CM

## 2023-09-17 DIAGNOSIS — R0681 Apnea, not elsewhere classified: Secondary | ICD-10-CM

## 2023-09-17 MED ORDER — DULOXETINE HCL 60 MG PO CPEP
ORAL_CAPSULE | Freq: Every day | ORAL | 1 refills | Status: DC
  Filled 2023-09-17: qty 180, 90d supply, fill #0

## 2023-09-18 ENCOUNTER — Other Ambulatory Visit (HOSPITAL_COMMUNITY): Payer: Self-pay

## 2023-09-18 DIAGNOSIS — Z800 Family history of malignant neoplasm of digestive organs: Secondary | ICD-10-CM | POA: Diagnosis not present

## 2023-09-18 DIAGNOSIS — D122 Benign neoplasm of ascending colon: Secondary | ICD-10-CM | POA: Diagnosis not present

## 2023-09-18 MED ORDER — DULOXETINE HCL 60 MG PO CPEP: ORAL_CAPSULE | Freq: Every day | ORAL | 1 refills | Status: DC

## 2023-09-18 MED ORDER — DULOXETINE HCL 60 MG PO CPEP
120.0000 mg | ORAL_CAPSULE | Freq: Every day | ORAL | 1 refills | Status: DC
  Filled 2023-09-18: qty 180, fill #0

## 2023-09-18 NOTE — Addendum Note (Signed)
 Addended by: Chalmers Cater on: 09/18/2023 04:09 PM   Modules accepted: Orders

## 2023-09-19 ENCOUNTER — Other Ambulatory Visit (HOSPITAL_COMMUNITY): Payer: Self-pay

## 2023-09-19 ENCOUNTER — Telehealth: Payer: Self-pay | Admitting: Genetic Counselor

## 2023-09-19 NOTE — Telephone Encounter (Signed)
Scheduled appointments per scheduling message. Patient is aware of the made appointments.  

## 2023-09-20 ENCOUNTER — Other Ambulatory Visit: Payer: Self-pay

## 2023-09-20 ENCOUNTER — Other Ambulatory Visit (HOSPITAL_COMMUNITY): Payer: Self-pay

## 2023-09-20 MED ORDER — METRONIDAZOLE 500 MG PO TABS
ORAL_TABLET | ORAL | 0 refills | Status: DC
  Filled 2023-09-20: qty 3, 1d supply, fill #0

## 2023-09-20 MED ORDER — ONDANSETRON 4 MG PO TBDP
ORAL_TABLET | ORAL | 0 refills | Status: DC
  Filled 2023-09-20: qty 18, 4d supply, fill #0

## 2023-09-20 MED ORDER — NEOMYCIN SULFATE 500 MG PO TABS
ORAL_TABLET | ORAL | 0 refills | Status: DC
  Filled 2023-09-20: qty 6, 1d supply, fill #0

## 2023-09-20 MED ORDER — POLYETHYLENE GLYCOL 3350 17 GM/SCOOP PO POWD
ORAL | 0 refills | Status: DC
  Filled 2023-09-20: qty 238, 1d supply, fill #0

## 2023-09-20 MED ORDER — BISACODYL 5 MG PO TBEC
DELAYED_RELEASE_TABLET | ORAL | 0 refills | Status: DC
  Filled 2023-09-20: qty 4, 1d supply, fill #0

## 2023-09-21 ENCOUNTER — Telehealth: Payer: Self-pay | Admitting: *Deleted

## 2023-09-21 ENCOUNTER — Encounter: Payer: Self-pay | Admitting: Gastroenterology

## 2023-09-21 NOTE — Telephone Encounter (Signed)
 Called patient to inform of Dr. Rennis recommendation and patient states that she agrees with having the esophagram done. Patient also states it will be ok to go ahead and order/ schedule the procedure and just make her aware of the appt time. She is also interested in having a F/U as well.

## 2023-09-24 ENCOUNTER — Other Ambulatory Visit: Payer: Self-pay | Admitting: *Deleted

## 2023-09-24 DIAGNOSIS — R1310 Dysphagia, unspecified: Secondary | ICD-10-CM

## 2023-09-24 NOTE — Telephone Encounter (Signed)
 Patient called to notify barium swallow ordered and she will be receiving a call from imaging to schedule an appt. Also informed the patient the f/u availability with Dr. Barron Alvine is in March. Patient chose the appt f/u for 11/21/2023 at 11:20a.

## 2023-09-26 ENCOUNTER — Ambulatory Visit: Payer: Commercial Managed Care - PPO | Admitting: Gastroenterology

## 2023-09-27 ENCOUNTER — Other Ambulatory Visit: Payer: Self-pay

## 2023-09-27 ENCOUNTER — Other Ambulatory Visit: Payer: Self-pay | Admitting: Medical-Surgical

## 2023-09-28 ENCOUNTER — Other Ambulatory Visit (HOSPITAL_BASED_OUTPATIENT_CLINIC_OR_DEPARTMENT_OTHER): Payer: Self-pay

## 2023-09-28 ENCOUNTER — Other Ambulatory Visit: Payer: Self-pay

## 2023-09-28 MED ORDER — ROSUVASTATIN CALCIUM 20 MG PO TABS
20.0000 mg | ORAL_TABLET | Freq: Every day | ORAL | 0 refills | Status: DC
  Filled 2023-09-28: qty 90, 90d supply, fill #0

## 2023-10-07 ENCOUNTER — Telehealth: Payer: Self-pay | Admitting: Pulmonary Disease

## 2023-10-07 NOTE — Telephone Encounter (Signed)
Call patient  Sleep study result  Date of study: 09/17/2023  Impression: Severe obstructive sleep apnea with AHI of 64 Moderate oxygen desaturations  Recommendation: DME referral  Recommend CPAP therapy for severe obstructive sleep apnea  Auto titrating CPAP with pressure settings of 5-20 will be appropriate  Encourage weight loss measures  Follow-up in the office 4 to 6 weeks following initiation of treatment

## 2023-10-08 NOTE — Telephone Encounter (Signed)
Can we follow up on these results. Make sure she is set up with CPAP per Dr. Val Eagle recommendations. And set up compliance follow-up 31-90 days

## 2023-10-09 DIAGNOSIS — R0681 Apnea, not elsewhere classified: Secondary | ICD-10-CM

## 2023-10-09 DIAGNOSIS — G4733 Obstructive sleep apnea (adult) (pediatric): Secondary | ICD-10-CM

## 2023-10-10 NOTE — Telephone Encounter (Signed)
Spoke to patient. She did agree to DME order for CPAP. Nothing further needed.

## 2023-10-12 NOTE — Telephone Encounter (Signed)
Left message on VM for patient to call the clinic to make sure patient is using a CPAP and to schedule a 30-90 day follow up compliance office visit.

## 2023-10-15 NOTE — Telephone Encounter (Addendum)
Spoke with patient.  Patient is not using a CPAP.  A CPAP was just ordered on 10/09/2023 and the DME company, [employer], called patient today and is delivering a CPAP to patients home.  Patient stated once she starts on CPAP therapy, she will call our office and schedule a 30-90 day follow up compliance office visit. Patient verbalized understanding.

## 2023-10-19 ENCOUNTER — Encounter (HOSPITAL_COMMUNITY): Payer: Self-pay

## 2023-10-19 ENCOUNTER — Other Ambulatory Visit (HOSPITAL_COMMUNITY): Payer: Self-pay

## 2023-10-19 ENCOUNTER — Other Ambulatory Visit: Payer: Self-pay | Admitting: Medical-Surgical

## 2023-10-22 ENCOUNTER — Other Ambulatory Visit: Payer: Self-pay | Admitting: Medical-Surgical

## 2023-10-22 ENCOUNTER — Ambulatory Visit: Payer: Self-pay | Admitting: Surgery

## 2023-10-22 ENCOUNTER — Other Ambulatory Visit: Payer: Self-pay

## 2023-10-22 ENCOUNTER — Other Ambulatory Visit (HOSPITAL_COMMUNITY): Payer: Self-pay

## 2023-10-22 DIAGNOSIS — Z01818 Encounter for other preprocedural examination: Secondary | ICD-10-CM

## 2023-10-22 DIAGNOSIS — E890 Postprocedural hypothyroidism: Secondary | ICD-10-CM

## 2023-10-22 MED ORDER — LEVOTHYROXINE SODIUM 150 MCG PO TABS
ORAL_TABLET | ORAL | 0 refills | Status: DC
  Filled 2023-10-22: qty 30, 32d supply, fill #0

## 2023-10-22 NOTE — Telephone Encounter (Signed)
 Last office visit 04/20/2023  Last filled 07/23/2023  Last labs 04/19/2023

## 2023-10-24 ENCOUNTER — Encounter (HOSPITAL_COMMUNITY): Payer: Self-pay

## 2023-10-24 ENCOUNTER — Other Ambulatory Visit (HOSPITAL_COMMUNITY): Payer: Self-pay

## 2023-10-24 NOTE — Patient Instructions (Addendum)
SURGICAL WAITING ROOM VISITATION Patients having surgery or a procedure may have no more than 2 support people in the waiting area - these visitors may rotate.    Children under the age of 44 must have an adult with them who is not the patient.  Due to an increase in RSV and influenza rates and associated hospitalizations, children ages 31 and under may not visit patients in [facility].   If the patient needs to stay at the hospital during part of their recovery, the visitor guidelines for inpatient rooms apply. Pre-op nurse will coordinate an appropriate time for 1 support person to accompany patient in pre-op.  This support person may not rotate.    Please refer to the [facility] website for the visitor guidelines for Inpatients (after your surgery is over and you are in a regular room).       Your procedure is scheduled on: 10-31-23   Report to [facility] Main Entrance    Report to admitting at 6:15 AM   Call this number if you have problems the morning of surgery [phone]   Follow a clear liquid diet the day of prep to prevent dehydration   After Midnight you may have the following liquids until 5:30 AM DAY OF SURGERY  Water Non-Citrus Juices (without pulp, NO RED-Apple, White grape, White cranberry) Black Coffee (NO MILK/CREAM OR CREAMERS, sugar ok)  Clear Tea (NO MILK/CREAM OR CREAMERS, sugar ok) regular and decaf                             Plain Jell-O (NO RED)                                           Fruit ices (not with fruit pulp, NO RED)                                     Popsicles (NO RED)                                                               Sports drinks like Gatorade (NO RED)  Drink 2 Pre-surgery G2 the evening before surgery                    The day of surgery:  Drink ONE (1) Pre-Surgery G2 by 5:30 AM the morning of surgery. Drink in one sitting. Do not sip.  This drink was given to you during your hospital  pre-op  appointment visit. Nothing else to drink after completing the Pre-Surgery G2          If you have questions, please contact your surgeon's office.   FOLLOW BOWEL PREP AND ANY ADDITIONAL PRE OP INSTRUCTIONS YOU RECEIVED FROM YOUR SURGEON'S OFFICE!!!    -Clear liquids day of prep to prevent dehydration  - Bisacodyl (Dulcolax) 20 mg - Give with water the day prior to surgery.   - Miralax 255g - Mix with 64 oz Gatorade/Powerade.  Drink gradually over the next few hours (8 oz glass  every 15-30 minutes) until gone the day prior to surgery.   -Metronidazole (Flagyl) 1000 mg - At 2 pm, 3 pm and 10 pm after Miralax bowel prep the day prior to surgery.       -Neomycin 1000 mg - At 2 pm, 3 pm and 10 pm after Miralax  bowel prep the day prior to surgery.   Oral Hygiene is also important to reduce your risk of infection.                                    Remember - BRUSH YOUR TEETH THE MORNING OF SURGERY WITH YOUR REGULAR TOOTHPASTE   Do NOT smoke after Midnight   Take these medicines the morning of surgery with A SIP OF WATER:    Atenolol   Duloxetine   Levothyroxine   Omeprazole   Rosuvastatin   If needed Tylenol, Ondansetron  Stop all vitamins and herbal supplements 7 days before surgery                              You may not have any metal on your body including hair pins, jewelry, and body piercing             Do not wear make-up, lotions, powders, perfumes, or deodorant  Do not wear nail polish including gel and S&S, artificial/acrylic nails, or any other type of covering on natural nails including finger and toenails. If you have artificial nails, gel coating, etc. that needs to be removed by a nail salon please have this removed prior to surgery or surgery may need to be canceled/ delayed if the surgeon/ anesthesia feels like they are unable to be safely monitored.   Do not shave  48 hours prior to surgery.    Do not bring valuables to the hospital. [facility] IS NOT  RESPONSIBLE   FOR VALUABLES.   Contacts, dentures or bridgework may not be worn into surgery.   Bring small overnight bag day of surgery.   DO NOT BRING YOUR HOME MEDICATIONS TO THE HOSPITAL. PHARMACY WILL DISPENSE MEDICATIONS LISTED ON YOUR MEDICATION LIST TO YOU DURING YOUR ADMISSION IN THE HOSPITAL!                Please read over the following fact sheets you were given: IF YOU HAVE QUESTIONS ABOUT YOUR PRE-OP INSTRUCTIONS PLEASE CALL [phone] [facility]  If you received a COVID test during your pre-op visit  it is requested that you wear a mask when out in public, stay away from anyone that may not be feeling well and notify your surgeon if you develop symptoms. If you test positive for Covid or have been in contact with anyone that has tested positive in the last 10 days please notify you surgeon.  [employer] - Preparing for Surgery Before surgery, you can play an important role.  Because skin is not sterile, your skin needs to be as free of germs as possible.  You can reduce the number of germs on your skin by washing with CHG (chlorahexidine gluconate) soap before surgery.  CHG is an antiseptic cleaner which kills germs and bonds with the skin to continue killing germs even after washing. Please DO NOT use if you have an allergy to CHG or antibacterial soaps.  If your skin becomes reddened/irritated stop using the CHG and inform your nurse when  you arrive at Short Stay. Do not shave (including legs and underarms) for at least 48 hours prior to the first CHG shower.  You may shave your face/neck.  Please follow these instructions carefully:  1.  Shower with CHG Soap the night before surgery and the  morning of surgery.  2.  If you choose to wash your hair, wash your hair first as usual with your normal  shampoo.  3.  After you shampoo, rinse your hair and body thoroughly to remove the shampoo.                             4.  Use CHG as you would any other liquid soap.  You can apply chg  directly to the skin and wash.  Gently with a scrungie or clean washcloth.  5.  Apply the CHG Soap to your body ONLY FROM THE NECK DOWN.   Do   not use on face/ open                           Wound or open sores. Avoid contact with eyes, ears mouth and   genitals (private parts).                       Wash face,  Genitals (private parts) with your normal soap.             6.  Wash thoroughly, paying special attention to the area where your    surgery  will be performed.  7.  Thoroughly rinse your body with warm water from the neck down.  8.  DO NOT shower/wash with your normal soap after using and rinsing off the CHG Soap.                9.  Pat yourself dry with a clean towel.            10.  Wear clean pajamas.            11.  Place clean sheets on your bed the night of your first shower and do not  sleep with pets. Day of Surgery : Do not apply any lotions/deodorants the morning of surgery.  Please wear clean clothes to the hospital/surgery center.  FAILURE TO FOLLOW THESE INSTRUCTIONS MAY RESULT IN THE CANCELLATION OF YOUR SURGERY  PATIENT SIGNATURE_________________________________  NURSE SIGNATURE__________________________________  ________________________________________________________________________   WHAT IS A BLOOD TRANSFUSION? Blood Transfusion Information  A transfusion is the replacement of blood or some of its parts. Blood is made up of multiple cells which provide different functions. Red blood cells carry oxygen and are used for blood loss replacement. White blood cells fight against infection. Platelets control bleeding. Plasma helps clot blood. Other blood products are available for specialized needs, such as hemophilia or other clotting disorders. BEFORE THE TRANSFUSION  Who gives blood for transfusions?  Healthy volunteers who are fully evaluated to make sure their blood is safe. This is blood bank blood. Transfusion therapy is the safest it has ever been in the  practice of medicine. Before blood is taken from a donor, a complete history is taken to make sure that person has no history of diseases nor engages in risky social behavior (examples are intravenous drug use or sexual activity with multiple partners). The donor's travel history is screened to minimize risk of transmitting infections, such as malaria. The donated  blood is tested for signs of infectious diseases, such as HIV and hepatitis. The blood is then tested to be sure it is compatible with you in order to minimize the chance of a transfusion reaction. If you or a relative donates blood, this is often done in anticipation of surgery and is not appropriate for emergency situations. It takes many days to process the donated blood. RISKS AND COMPLICATIONS Although transfusion therapy is very safe and saves many lives, the main dangers of transfusion include:  Getting an infectious disease. Developing a transfusion reaction. This is an allergic reaction to something in the blood you were given. Every precaution is taken to prevent this. The decision to have a blood transfusion has been considered carefully by your caregiver before blood is given. Blood is not given unless the benefits outweigh the risks. AFTER THE TRANSFUSION Right after receiving a blood transfusion, you will usually feel much better and more energetic. This is especially true if your red blood cells have gotten low (anemic). The transfusion raises the level of the red blood cells which carry oxygen, and this usually causes an energy increase. The nurse administering the transfusion will monitor you carefully for complications. HOME CARE INSTRUCTIONS  No special instructions are needed after a transfusion. You may find your energy is better. Speak with your caregiver about any limitations on activity for underlying diseases you may have. SEEK MEDICAL CARE IF:  Your condition is not improving after your transfusion. You develop  redness or irritation at the intravenous (IV) site. SEEK IMMEDIATE MEDICAL CARE IF:  Any of the following symptoms occur over the next 12 hours: Shaking chills. You have a temperature by mouth above 102 F (38.9 C), not controlled by medicine. Chest, back, or muscle pain. People around you feel you are not acting correctly or are confused. Shortness of breath or difficulty breathing. Dizziness and fainting. You get a rash or develop hives. You have a decrease in urine output. Your urine turns a dark color or changes to pink, red, or brown. Any of the following symptoms occur over the next 10 days: You have a temperature by mouth above 102 F (38.9 C), not controlled by medicine. Shortness of breath. Weakness after normal activity. The white part of the eye turns yellow (jaundice). You have a decrease in the amount of urine or are urinating less often. Your urine turns a dark color or changes to pink, red, or brown. Document Released: 08/25/2000 Document Revised: 11/20/2011 Document Reviewed: 04/13/2008 [facility] Patient Information 2014 [place], [place].  _______________________________________________________________________

## 2023-10-24 NOTE — Progress Notes (Signed)
COVID Vaccine Completed:  Yes  Date of COVID positive in last 90 days:  PCP - Christen Butter, NP Cardiologist -   Chest x-ray -  EKG - 01-16-23 Epic Stress Test -  ECHO -  Cardiac Cath -  Pacemaker/ICD device last checked: Spinal Cord Stimulator: Cardiac CT - 12-10-20 Epic  Bowel Prep -   Sleep Study - Yes, +sleep apnea CPAP -   Fasting Blood Sugar -  Checks Blood Sugar _____ times a day  Last dose of GLP1 agonist-  N/A GLP1 instructions:  Hold 7 days before surgery    Last dose of SGLT-2 inhibitors-  N/A SGLT-2 instructions:  Hold 3 days before surgery    Blood Thinner Instructions:  Last dose:   Time: Aspirin Instructions: Last Dose:  Activity level:  Can go up a flight of stairs and perform activities of daily living without stopping and without symptoms of chest pain or shortness of breath.  Able to exercise without symptoms  Unable to go up a flight of stairs without symptoms of     Anesthesia review:   Patient denies shortness of breath, fever, cough and chest pain at PAT appointment  Patient verbalized understanding of instructions that were given to them at the PAT appointment. Patient was also instructed that they will need to review over the PAT instructions again at home before surgery.

## 2023-10-25 ENCOUNTER — Encounter (HOSPITAL_COMMUNITY): Payer: Self-pay

## 2023-10-25 ENCOUNTER — Other Ambulatory Visit: Payer: Self-pay

## 2023-10-25 ENCOUNTER — Encounter (HOSPITAL_COMMUNITY)
Admission: RE | Admit: 2023-10-25 | Discharge: 2023-10-25 | Disposition: A | Discharge status: Home / Self Care | Payer: Commercial Managed Care - PPO | Source: Ambulatory Visit | Attending: Surgery | Admitting: Surgery

## 2023-10-25 ENCOUNTER — Other Ambulatory Visit (HOSPITAL_COMMUNITY): Payer: Self-pay

## 2023-10-25 DIAGNOSIS — Z01818 Encounter for other preprocedural examination: Secondary | ICD-10-CM | POA: Diagnosis present

## 2023-10-25 DIAGNOSIS — Z01812 Encounter for preprocedural laboratory examination: Secondary | ICD-10-CM | POA: Insufficient documentation

## 2023-10-25 HISTORY — DX: Prediabetes: R73.03

## 2023-10-25 HISTORY — DX: Hypothyroidism, unspecified: E03.9

## 2023-10-25 LAB — BASIC METABOLIC PANEL
Anion gap: 9 (ref 5–15)
BUN: 17 mg/dL (ref 8–23)
CO2: 26 mmol/L (ref 22–32)
Calcium: 9.6 mg/dL (ref 8.9–10.3)
Chloride: 103 mmol/L (ref 98–111)
Creatinine, Ser: 1.1 mg/dL — ABNORMAL HIGH (ref 0.44–1.00)
GFR, Estimated: 54 mL/min — ABNORMAL LOW (ref >60)
Glucose, Bld: 103 mg/dL — ABNORMAL HIGH (ref 70–99)
Potassium: 4.6 mmol/L (ref 3.5–5.1)
Sodium: 138 mmol/L (ref 135–145)

## 2023-10-25 LAB — CBC WITH DIFFERENTIAL/PLATELET
Abs Immature Granulocytes: 0.03 10*3/uL (ref 0.00–0.07)
Basophils Absolute: 0.1 10*3/uL (ref 0.0–0.1)
Basophils Relative: 1 %
Eosinophils Absolute: 0.2 10*3/uL (ref 0.0–0.5)
Eosinophils Relative: 3 %
HCT: 44.9 % (ref 36.0–46.0)
Hemoglobin: 14.2 g/dL (ref 12.0–15.0)
Immature Granulocytes: 0 %
Lymphocytes Relative: 34 %
Lymphs Abs: 2.4 10*3/uL (ref 0.7–4.0)
MCH: 30.5 pg (ref 26.0–34.0)
MCHC: 31.6 g/dL (ref 30.0–36.0)
MCV: 96.6 fL (ref 80.0–100.0)
Monocytes Absolute: 0.6 10*3/uL (ref 0.1–1.0)
Monocytes Relative: 8 %
Neutro Abs: 3.7 10*3/uL (ref 1.7–7.7)
Neutrophils Relative %: 54 %
Platelets: 284 10*3/uL (ref 150–400)
RBC: 4.65 MIL/uL (ref 3.87–5.11)
RDW: 13 % (ref 11.5–15.5)
WBC: 6.9 10*3/uL (ref 4.0–10.5)
nRBC: 0 % (ref 0.0–0.2)

## 2023-10-26 ENCOUNTER — Ambulatory Visit (HOSPITAL_COMMUNITY)
Admission: RE | Admit: 2023-10-26 | Discharge: 2023-10-26 | Disposition: A | Discharge status: Home / Self Care | Payer: Commercial Managed Care - PPO | Source: Ambulatory Visit | Attending: Gastroenterology | Admitting: Gastroenterology

## 2023-10-26 DIAGNOSIS — R1310 Dysphagia, unspecified: Secondary | ICD-10-CM | POA: Diagnosis not present

## 2023-10-26 DIAGNOSIS — K449 Diaphragmatic hernia without obstruction or gangrene: Secondary | ICD-10-CM | POA: Diagnosis not present

## 2023-10-30 NOTE — Anesthesia Preprocedure Evaluation (Signed)
Anesthesia Evaluation  Patient identified by MRN, date of birth, ID band Patient awake    Reviewed: Allergy & Precautions, NPO status , Patient's Chart, lab work & pertinent test results  Airway Mallampati: II  TM Distance: >3 FB Neck ROM: Full    Dental no notable dental hx.    Pulmonary sleep apnea and Continuous Positive Airway Pressure Ventilation    Pulmonary exam normal        Cardiovascular hypertension, Pt. on medications and Pt. on home beta blockers  Rhythm:Regular Rate:Normal     Neuro/Psych   Anxiety Depression    negative neurological ROS     GI/Hepatic Neg liver ROS,GERD  Medicated,,Colon polyp   Endo/Other  Hypothyroidism    Renal/GU negative Renal ROS  negative genitourinary   Musculoskeletal  (+) Arthritis , Osteoarthritis,    Abdominal Normal abdominal exam  (+)   Peds  Hematology Lab Results      Component                Value               Date                      WBC                      6.9                 10/25/2023                HGB                      14.2                10/25/2023                HCT                      44.9                10/25/2023                MCV                      96.6                10/25/2023                PLT                      284                 10/25/2023              Anesthesia Other Findings   Reproductive/Obstetrics                             Anesthesia Physical Anesthesia Plan  ASA: 2  Anesthesia Plan: General   Post-op Pain Management: Tylenol PO (pre-op)*   Induction: Intravenous  PONV Risk Score and Plan: 3 and Ondansetron, Dexamethasone and Treatment may vary due to age or medical condition  Airway Management Planned: Simple Face Mask and Nasal Cannula  Additional Equipment: None  Intra-op Plan:   Post-operative Plan: Extubation in OR  Informed Consent: I have reviewed the patients History and  Physical, chart, labs and  discussed the procedure including the risks, benefits and alternatives for the proposed anesthesia with the patient or authorized representative who has indicated his/her understanding and acceptance.     Dental advisory given  Plan Discussed with: CRNA  Anesthesia Plan Comments:        Anesthesia Quick Evaluation

## 2023-10-31 ENCOUNTER — Other Ambulatory Visit: Payer: Self-pay

## 2023-10-31 ENCOUNTER — Inpatient Hospital Stay (HOSPITAL_COMMUNITY)
Admission: RE | Admit: 2023-10-31 | Discharge: 2023-11-01 | DRG: 331 | Disposition: A | Discharge status: Home / Self Care | Payer: Commercial Managed Care - PPO | Attending: Surgery | Admitting: Surgery

## 2023-10-31 ENCOUNTER — Inpatient Hospital Stay (HOSPITAL_COMMUNITY): Payer: Self-pay | Admitting: Anesthesiology

## 2023-10-31 ENCOUNTER — Encounter (HOSPITAL_COMMUNITY)
Admission: RE | Disposition: A | Discharge status: Home / Self Care | Payer: Self-pay | Source: Home / Self Care | Attending: Surgery

## 2023-10-31 ENCOUNTER — Encounter (HOSPITAL_COMMUNITY): Payer: Self-pay | Admitting: Surgery

## 2023-10-31 DIAGNOSIS — K635 Polyp of colon: Secondary | ICD-10-CM | POA: Diagnosis present

## 2023-10-31 DIAGNOSIS — K2950 Unspecified chronic gastritis without bleeding: Secondary | ICD-10-CM | POA: Diagnosis present

## 2023-10-31 DIAGNOSIS — E785 Hyperlipidemia, unspecified: Secondary | ICD-10-CM | POA: Diagnosis present

## 2023-10-31 DIAGNOSIS — Z887 Allergy status to serum and vaccine status: Secondary | ICD-10-CM

## 2023-10-31 DIAGNOSIS — K5730 Diverticulosis of large intestine without perforation or abscess without bleeding: Secondary | ICD-10-CM | POA: Diagnosis present

## 2023-10-31 DIAGNOSIS — E890 Postprocedural hypothyroidism: Secondary | ICD-10-CM | POA: Diagnosis present

## 2023-10-31 DIAGNOSIS — Z8249 Family history of ischemic heart disease and other diseases of the circulatory system: Secondary | ICD-10-CM | POA: Diagnosis not present

## 2023-10-31 DIAGNOSIS — Z888 Allergy status to other drugs, medicaments and biological substances status: Secondary | ICD-10-CM | POA: Diagnosis not present

## 2023-10-31 DIAGNOSIS — Z882 Allergy status to sulfonamides status: Secondary | ICD-10-CM | POA: Diagnosis not present

## 2023-10-31 DIAGNOSIS — K5989 Other specified functional intestinal disorders: Secondary | ICD-10-CM

## 2023-10-31 DIAGNOSIS — D122 Benign neoplasm of ascending colon: Secondary | ICD-10-CM | POA: Diagnosis not present

## 2023-10-31 DIAGNOSIS — Z9049 Acquired absence of other specified parts of digestive tract: Principal | ICD-10-CM

## 2023-10-31 DIAGNOSIS — G4733 Obstructive sleep apnea (adult) (pediatric): Secondary | ICD-10-CM | POA: Diagnosis present

## 2023-10-31 DIAGNOSIS — K219 Gastro-esophageal reflux disease without esophagitis: Secondary | ICD-10-CM | POA: Diagnosis present

## 2023-10-31 DIAGNOSIS — M5030 Other cervical disc degeneration, unspecified cervical region: Secondary | ICD-10-CM | POA: Diagnosis present

## 2023-10-31 DIAGNOSIS — K660 Peritoneal adhesions (postprocedural) (postinfection): Secondary | ICD-10-CM | POA: Diagnosis present

## 2023-10-31 DIAGNOSIS — I10 Essential (primary) hypertension: Secondary | ICD-10-CM | POA: Diagnosis present

## 2023-10-31 HISTORY — PX: LAPAROSCOPIC RIGHT HEMI COLECTOMY: SHX5926

## 2023-10-31 LAB — GLUCOSE, CAPILLARY: Glucose-Capillary: 125 mg/dL — ABNORMAL HIGH (ref 70–99)

## 2023-10-31 LAB — TYPE AND SCREEN
ABO/RH(D): A POS
Antibody Screen: NEGATIVE

## 2023-10-31 LAB — ABO/RH: ABO/RH(D): A POS

## 2023-10-31 SURGERY — LAPAROSCOPIC RIGHT HEMI COLECTOMY
Anesthesia: General | Laterality: Right

## 2023-10-31 MED ORDER — ATENOLOL 25 MG PO TABS
50.0000 mg | ORAL_TABLET | Freq: Every day | ORAL | Status: DC
  Administered 2023-11-01: 50 mg via ORAL
  Filled 2023-10-31: qty 2

## 2023-10-31 MED ORDER — LACTATED RINGERS IV SOLN: INTRAVENOUS | Status: DC | PRN

## 2023-10-31 MED ORDER — HEPARIN SODIUM (PORCINE) 5000 UNIT/ML IJ SOLN
5000.0000 [IU] | Freq: Once | INTRAMUSCULAR | Status: AC
  Administered 2023-10-31: 5000 [IU] via SUBCUTANEOUS
  Filled 2023-10-31: qty 1

## 2023-10-31 MED ORDER — ACETAMINOPHEN 500 MG PO TABS
1000.0000 mg | ORAL_TABLET | Freq: Four times a day (QID) | ORAL | Status: DC
  Administered 2023-10-31 – 2023-11-01 (×4): 1000 mg via ORAL
  Filled 2023-10-31 (×4): qty 2

## 2023-10-31 MED ORDER — BUPIVACAINE-EPINEPHRINE (PF) 0.25% -1:200000 IJ SOLN
INTRAMUSCULAR | Status: DC | PRN
  Administered 2023-10-31: 50 mL

## 2023-10-31 MED ORDER — ACETAMINOPHEN 10 MG/ML IV SOLN: 1000.0000 mg | Freq: Once | INTRAVENOUS | Status: DC | PRN

## 2023-10-31 MED ORDER — ACETAMINOPHEN 500 MG PO TABS
1000.0000 mg | ORAL_TABLET | Freq: Once | ORAL | Status: DC
  Filled 2023-10-31: qty 2

## 2023-10-31 MED ORDER — PHENYLEPHRINE 80 MCG/ML (10ML) SYRINGE FOR IV PUSH (FOR BLOOD PRESSURE SUPPORT)
PREFILLED_SYRINGE | INTRAVENOUS | Status: AC
  Filled 2023-10-31: qty 10

## 2023-10-31 MED ORDER — LACTATED RINGERS IV SOLN: INTRAVENOUS | Status: DC

## 2023-10-31 MED ORDER — IBUPROFEN 400 MG PO TABS: 600.0000 mg | ORAL_TABLET | Freq: Four times a day (QID) | ORAL | Status: DC | PRN

## 2023-10-31 MED ORDER — ALUM & MAG HYDROXIDE-SIMETH 200-200-20 MG/5ML PO SUSP: 30.0000 mL | Freq: Four times a day (QID) | ORAL | Status: DC | PRN

## 2023-10-31 MED ORDER — PROPOFOL 10 MG/ML IV BOLUS
INTRAVENOUS | Status: DC | PRN
  Administered 2023-10-31: 160 mg via INTRAVENOUS

## 2023-10-31 MED ORDER — CHLORHEXIDINE GLUCONATE CLOTH 2 % EX PADS
6.0000 | MEDICATED_PAD | Freq: Once | CUTANEOUS | Status: DC
Start: 2023-10-31 — End: 2023-10-31

## 2023-10-31 MED ORDER — ORAL CARE MOUTH RINSE: 15.0000 mL | Freq: Once | OROMUCOSAL | Status: AC

## 2023-10-31 MED ORDER — HYDROMORPHONE HCL 1 MG/ML IJ SOLN
INTRAMUSCULAR | Status: AC
  Administered 2023-10-31: 0.5 mg via INTRAVENOUS
  Filled 2023-10-31: qty 1

## 2023-10-31 MED ORDER — FENTANYL CITRATE (PF) 100 MCG/2ML IJ SOLN
INTRAMUSCULAR | Status: DC | PRN
  Administered 2023-10-31: 100 ug via INTRAVENOUS

## 2023-10-31 MED ORDER — HEPARIN SODIUM (PORCINE) 5000 UNIT/ML IJ SOLN
5000.0000 [IU] | Freq: Three times a day (TID) | INTRAMUSCULAR | Status: DC
  Administered 2023-10-31 – 2023-11-01 (×2): 5000 [IU] via SUBCUTANEOUS
  Filled 2023-10-31 (×2): qty 1

## 2023-10-31 MED ORDER — DEXAMETHASONE SODIUM PHOSPHATE 10 MG/ML IJ SOLN
INTRAMUSCULAR | Status: DC | PRN
  Administered 2023-10-31: 8 mg via INTRAVENOUS

## 2023-10-31 MED ORDER — ENSURE SURGERY PO LIQD
237.0000 mL | Freq: Two times a day (BID) | ORAL | Status: DC
  Administered 2023-11-01: 237 mL via ORAL

## 2023-10-31 MED ORDER — ONDANSETRON HCL 4 MG/2ML IJ SOLN
INTRAMUSCULAR | Status: DC | PRN
  Administered 2023-10-31: 4 mg via INTRAVENOUS

## 2023-10-31 MED ORDER — LEVOTHYROXINE SODIUM 75 MCG PO TABS
150.0000 ug | ORAL_TABLET | ORAL | Status: DC
  Administered 2023-11-01: 150 ug via ORAL
  Filled 2023-10-31: qty 2

## 2023-10-31 MED ORDER — BUPIVACAINE-EPINEPHRINE 0.25% -1:200000 IJ SOLN
INTRAMUSCULAR | Status: AC
  Filled 2023-10-31: qty 1

## 2023-10-31 MED ORDER — DIPHENHYDRAMINE HCL 12.5 MG/5ML PO ELIX
12.5000 mg | ORAL_SOLUTION | Freq: Four times a day (QID) | ORAL | Status: DC | PRN
  Administered 2023-11-01: 12.5 mg via ORAL
  Filled 2023-10-31: qty 5

## 2023-10-31 MED ORDER — SODIUM CHLORIDE 0.9 % IV SOLN
2.0000 g | INTRAVENOUS | Status: AC
  Administered 2023-10-31: 2 g via INTRAVENOUS
  Filled 2023-10-31: qty 2

## 2023-10-31 MED ORDER — ORAL CARE MOUTH RINSE: 15.0000 mL | Freq: Once | OROMUCOSAL | Status: DC

## 2023-10-31 MED ORDER — CHLORHEXIDINE GLUCONATE 0.12 % MT SOLN
15.0000 mL | Freq: Once | OROMUCOSAL | Status: AC
  Administered 2023-10-31: 15 mL via OROMUCOSAL

## 2023-10-31 MED ORDER — PANTOPRAZOLE SODIUM 40 MG PO TBEC
40.0000 mg | DELAYED_RELEASE_TABLET | Freq: Every day | ORAL | Status: DC
  Administered 2023-11-01: 40 mg via ORAL
  Filled 2023-10-31: qty 1

## 2023-10-31 MED ORDER — ONDANSETRON HCL 4 MG PO TABS: 4.0000 mg | ORAL_TABLET | Freq: Four times a day (QID) | ORAL | Status: DC | PRN

## 2023-10-31 MED ORDER — CHLORHEXIDINE GLUCONATE CLOTH 2 % EX PADS: 6.0000 | MEDICATED_PAD | Freq: Once | CUTANEOUS | Status: DC

## 2023-10-31 MED ORDER — ATENOLOL 25 MG PO TABS
25.0000 mg | ORAL_TABLET | Freq: Every day | ORAL | Status: DC
  Administered 2023-10-31: 25 mg via ORAL
  Filled 2023-10-31: qty 1

## 2023-10-31 MED ORDER — SUGAMMADEX SODIUM 200 MG/2ML IV SOLN
INTRAVENOUS | Status: DC | PRN
  Administered 2023-10-31: 200 mg via INTRAVENOUS

## 2023-10-31 MED ORDER — PHENYLEPHRINE HCL (PRESSORS) 10 MG/ML IV SOLN
INTRAVENOUS | Status: DC | PRN
  Administered 2023-10-31: 160 ug via INTRAVENOUS
  Administered 2023-10-31: 80 ug via INTRAVENOUS
  Administered 2023-10-31: 160 ug via INTRAVENOUS
  Administered 2023-10-31 (×2): 80 ug via INTRAVENOUS

## 2023-10-31 MED ORDER — SODIUM CHLORIDE (PF) 0.9 % IJ SOLN
INTRAMUSCULAR | Status: AC
  Filled 2023-10-31: qty 10

## 2023-10-31 MED ORDER — HYDROMORPHONE HCL 1 MG/ML IJ SOLN
INTRAMUSCULAR | Status: DC | PRN
  Administered 2023-10-31 (×2): .5 mg via INTRAVENOUS

## 2023-10-31 MED ORDER — CHLORHEXIDINE GLUCONATE 0.12 % MT SOLN: 15.0000 mL | Freq: Once | OROMUCOSAL | Status: DC

## 2023-10-31 MED ORDER — MIDAZOLAM HCL 5 MG/5ML IJ SOLN
INTRAMUSCULAR | Status: DC | PRN
  Administered 2023-10-31: 2 mg via INTRAVENOUS

## 2023-10-31 MED ORDER — NEOMYCIN SULFATE 500 MG PO TABS: 1000.0000 mg | ORAL_TABLET | ORAL | Status: DC

## 2023-10-31 MED ORDER — TRAMADOL HCL 50 MG PO TABS: 50.0000 mg | ORAL_TABLET | Freq: Four times a day (QID) | ORAL | Status: DC | PRN

## 2023-10-31 MED ORDER — ENSURE PRE-SURGERY PO LIQD
296.0000 mL | Freq: Once | ORAL | Status: DC
Start: 2023-11-01 — End: 2023-10-31

## 2023-10-31 MED ORDER — MIDAZOLAM HCL 2 MG/2ML IJ SOLN
INTRAMUSCULAR | Status: AC
  Filled 2023-10-31: qty 2

## 2023-10-31 MED ORDER — LACTATED RINGERS IR SOLN
Status: DC | PRN
  Administered 2023-10-31: 1000 mL

## 2023-10-31 MED ORDER — HYDRALAZINE HCL 20 MG/ML IJ SOLN: 10.0000 mg | INTRAMUSCULAR | Status: DC | PRN

## 2023-10-31 MED ORDER — LIDOCAINE HCL (CARDIAC) PF 100 MG/5ML IV SOSY
PREFILLED_SYRINGE | INTRAVENOUS | Status: DC | PRN
  Administered 2023-10-31: 60 mg via INTRAVENOUS

## 2023-10-31 MED ORDER — HYDROMORPHONE HCL 1 MG/ML IJ SOLN: 0.5000 mg | INTRAMUSCULAR | Status: DC | PRN

## 2023-10-31 MED ORDER — ENSURE PRE-SURGERY PO LIQD: 592.0000 mL | Freq: Once | ORAL | Status: DC

## 2023-10-31 MED ORDER — ROSUVASTATIN CALCIUM 20 MG PO TABS: 20.0000 mg | ORAL_TABLET | Freq: Every day | ORAL | Status: DC

## 2023-10-31 MED ORDER — DIPHENHYDRAMINE HCL 50 MG/ML IJ SOLN
25.0000 mg | Freq: Once | INTRAMUSCULAR | Status: AC
Start: 2023-10-31 — End: 2023-10-31
  Administered 2023-10-31: 25 mg via INTRAVENOUS
  Filled 2023-10-31: qty 1

## 2023-10-31 MED ORDER — ACETAMINOPHEN 500 MG PO TABS
1000.0000 mg | ORAL_TABLET | ORAL | Status: DC
Start: 2023-10-31 — End: 2023-10-31

## 2023-10-31 MED ORDER — DIPHENHYDRAMINE HCL 50 MG/ML IJ SOLN: 12.5000 mg | Freq: Four times a day (QID) | INTRAMUSCULAR | Status: DC | PRN

## 2023-10-31 MED ORDER — BUPIVACAINE LIPOSOME 1.3 % IJ SUSP
INTRAMUSCULAR | Status: AC
  Filled 2023-10-31: qty 20

## 2023-10-31 MED ORDER — BUPIVACAINE LIPOSOME 1.3 % IJ SUSP: 20.0000 mL | Freq: Once | INTRAMUSCULAR | Status: DC

## 2023-10-31 MED ORDER — SIMETHICONE 80 MG PO CHEW: 40.0000 mg | CHEWABLE_TABLET | Freq: Four times a day (QID) | ORAL | Status: DC | PRN

## 2023-10-31 MED ORDER — HYDROMORPHONE HCL 1 MG/ML IJ SOLN
0.2500 mg | INTRAMUSCULAR | Status: DC | PRN
  Administered 2023-10-31: 0.5 mg via INTRAVENOUS

## 2023-10-31 MED ORDER — ALVIMOPAN 12 MG PO CAPS
12.0000 mg | ORAL_CAPSULE | ORAL | Status: AC
  Administered 2023-10-31: 12 mg via ORAL
  Filled 2023-10-31: qty 1

## 2023-10-31 MED ORDER — POLYETHYLENE GLYCOL 3350 17 GM/SCOOP PO POWD: 1.0000 | Freq: Once | ORAL | Status: DC

## 2023-10-31 MED ORDER — PROPOFOL 10 MG/ML IV BOLUS
INTRAVENOUS | Status: AC
  Filled 2023-10-31: qty 20

## 2023-10-31 MED ORDER — ROCURONIUM BROMIDE 100 MG/10ML IV SOLN
INTRAVENOUS | Status: DC | PRN
  Administered 2023-10-31: 20 mg via INTRAVENOUS
  Administered 2023-10-31: 60 mg via INTRAVENOUS

## 2023-10-31 MED ORDER — ALVIMOPAN 12 MG PO CAPS: 12.0000 mg | ORAL_CAPSULE | Freq: Two times a day (BID) | ORAL | Status: DC

## 2023-10-31 MED ORDER — HYDROMORPHONE HCL 2 MG/ML IJ SOLN
INTRAMUSCULAR | Status: AC
  Filled 2023-10-31: qty 1

## 2023-10-31 MED ORDER — METRONIDAZOLE 500 MG PO TABS
1000.0000 mg | ORAL_TABLET | ORAL | Status: DC
Start: 2023-10-31 — End: 2023-10-31

## 2023-10-31 MED ORDER — ONDANSETRON HCL 4 MG/2ML IJ SOLN: 4.0000 mg | Freq: Four times a day (QID) | INTRAMUSCULAR | Status: DC | PRN

## 2023-10-31 MED ORDER — FENTANYL CITRATE (PF) 100 MCG/2ML IJ SOLN
INTRAMUSCULAR | Status: AC
  Filled 2023-10-31: qty 2

## 2023-10-31 MED ORDER — 0.9 % SODIUM CHLORIDE (POUR BTL) OPTIME
TOPICAL | Status: DC | PRN
  Administered 2023-10-31: 2000 mL

## 2023-10-31 MED ORDER — DULOXETINE HCL 60 MG PO CPEP
60.0000 mg | ORAL_CAPSULE | Freq: Every day | ORAL | Status: DC
  Administered 2023-11-01: 60 mg via ORAL
  Filled 2023-10-31: qty 1

## 2023-10-31 MED ORDER — BISACODYL 5 MG PO TBEC: 20.0000 mg | DELAYED_RELEASE_TABLET | Freq: Once | ORAL | Status: DC

## 2023-10-31 MED ORDER — LEVOTHYROXINE SODIUM 75 MCG PO TABS: 75.0000 ug | ORAL_TABLET | ORAL | Status: DC

## 2023-10-31 MED ORDER — IRBESARTAN 75 MG PO TABS
75.0000 mg | ORAL_TABLET | Freq: Every day | ORAL | Status: DC
  Administered 2023-11-01: 75 mg via ORAL
  Filled 2023-10-31: qty 1

## 2023-10-31 MED ORDER — PHENYLEPHRINE HCL-NACL 20-0.9 MG/250ML-% IV SOLN
INTRAVENOUS | Status: AC
  Filled 2023-10-31: qty 250

## 2023-10-31 SURGICAL SUPPLY — 66 items
APPLIER CLIP 5 13 M/L LIGAMAX5 (MISCELLANEOUS) IMPLANT
APPLIER CLIP ROT 10 11.4 M/L (STAPLE) IMPLANT
BAG COUNTER SPONGE SURGICOUNT (BAG) IMPLANT
BLADE EXTENDED COATED 6.5IN (ELECTRODE) IMPLANT
CABLE HIGH FREQUENCY MONO STRZ (ELECTRODE) IMPLANT
CATH MUSHROOM 30FR (CATHETERS) IMPLANT
CLIP APPLIE 5 13 M/L LIGAMAX5 (MISCELLANEOUS) IMPLANT
CLIP APPLIE ROT 10 11.4 M/L (STAPLE) IMPLANT
DEFOGGER SCOPE WARMER CLEARIFY (MISCELLANEOUS) ×1 IMPLANT
DERMABOND ADVANCED .7 DNX12 (GAUZE/BANDAGES/DRESSINGS) ×1 IMPLANT
DISSECTOR BLUNT TIP ENDO 5MM (MISCELLANEOUS) IMPLANT
DRAIN CHANNEL 19F RND (DRAIN) IMPLANT
DRAPE SURG IRRIG POUCH 19X23 (DRAPES) ×2 IMPLANT
DRSG OPSITE POSTOP 4X10 (GAUZE/BANDAGES/DRESSINGS) IMPLANT
DRSG OPSITE POSTOP 4X6 (GAUZE/BANDAGES/DRESSINGS) IMPLANT
DRSG OPSITE POSTOP 4X8 (GAUZE/BANDAGES/DRESSINGS) IMPLANT
ELECT REM PT RETURN 15FT ADLT (MISCELLANEOUS) ×2 IMPLANT
EVACUATOR SILICONE 100CC (DRAIN) IMPLANT
GAUZE SPONGE 4X4 12PLY STRL (GAUZE/BANDAGES/DRESSINGS) IMPLANT
GLOVE BIO SURGEON STRL SZ7.5 (GLOVE) ×4 IMPLANT
GLOVE INDICATOR 8.0 STRL GRN (GLOVE) ×4 IMPLANT
GOWN STRL REUS W/ TWL XL LVL3 (GOWN DISPOSABLE) ×8 IMPLANT
HOLDER FOLEY CATH W/STRAP (MISCELLANEOUS) ×2 IMPLANT
IRRIG SUCT STRYKERFLOW 2 WTIP (MISCELLANEOUS) ×2 IMPLANT
IRRIGATION SUCT STRKRFLW 2 WTP (MISCELLANEOUS) ×1 IMPLANT
KIT TURNOVER KIT A (KITS) IMPLANT
LIGASURE IMPACT 36 18CM CVD LR (INSTRUMENTS) IMPLANT
NDL INSUFFLATION 14GA 120MM (NEEDLE) IMPLANT
NEEDLE INSUFFLATION 14GA 120MM (NEEDLE) IMPLANT
PACK COLON (CUSTOM PROCEDURE TRAY) ×2 IMPLANT
PAD POSITIONING PINK XL (MISCELLANEOUS) ×2 IMPLANT
PENCIL SMOKE EVACUATOR (MISCELLANEOUS) IMPLANT
RELOAD PROXIMATE 75MM BLUE (ENDOMECHANICALS) ×2 IMPLANT
RELOAD STAPLE 75 3.8 BLU REG (ENDOMECHANICALS) IMPLANT
RETRACTOR WND ALEXIS 18 MED (MISCELLANEOUS) IMPLANT
RTRCTR WOUND ALEXIS 18CM MED (MISCELLANEOUS) IMPLANT
SCISSORS LAP 5X35 DISP (ENDOMECHANICALS) ×2 IMPLANT
SEALER TISSUE G2 STRG ARTC 35C (ENDOMECHANICALS) ×2 IMPLANT
SET TUBE SMOKE EVAC HIGH FLOW (TUBING) ×2 IMPLANT
SLEEVE ADV FIXATION 5X100MM (TROCAR) ×4 IMPLANT
SPIKE FLUID TRANSFER (MISCELLANEOUS) ×2 IMPLANT
SPONGE DRAIN TRACH 4X4 STRL 2S (GAUZE/BANDAGES/DRESSINGS) IMPLANT
STAPLER GUN LINEAR PROX 60 (STAPLE) ×1 IMPLANT
STAPLER PROXIMATE 75MM BLUE (STAPLE) ×2 IMPLANT
STAPLER SKIN PROX WIDE 3.9 (STAPLE) IMPLANT
SUT ETHILON 3 0 PS 1 (SUTURE) IMPLANT
SUT PDS AB 1 TP1 54 (SUTURE) IMPLANT
SUT PDS AB 1 TP1 96 (SUTURE) IMPLANT
SUT PROLENE 2 0 KS (SUTURE) ×2 IMPLANT
SUT PROLENE 2 0 SH DA (SUTURE) ×2 IMPLANT
SUT SILK 2 0 SH CR/8 (SUTURE) ×2 IMPLANT
SUT SILK 2-0 18XBRD TIE 12 (SUTURE) ×2 IMPLANT
SUT SILK 3 0 SH CR/8 (SUTURE) ×2 IMPLANT
SUT SILK 3-0 18XBRD TIE 12 (SUTURE) ×2 IMPLANT
SUT VIC AB 2-0 SH 27X BRD (SUTURE) IMPLANT
SUT VIC AB 3-0 SH 18 (SUTURE) IMPLANT
SUT VIC AB 3-0 SH 27X BRD (SUTURE) IMPLANT
SUT VICRYL 2 0 18 UND BR (SUTURE) ×2 IMPLANT
SYS LAPSCP GELPORT 120MM (MISCELLANEOUS) IMPLANT
SYS WOUND ALEXIS 18CM MED (MISCELLANEOUS) IMPLANT
SYSTEM LAPSCP GELPORT 120MM (MISCELLANEOUS) IMPLANT
SYSTEM WOUND ALEXIS 18CM MED (MISCELLANEOUS) IMPLANT
TOWEL OR 17X26 10 PK STRL BLUE (TOWEL DISPOSABLE) IMPLANT
TRAY IRRIG W/60CC SYR STRL (SET/KITS/TRAYS/PACK) ×2 IMPLANT
TROCAR ADV FIXATION 5X100MM (TROCAR) ×2 IMPLANT
TROCAR BALLN 12MMX100 BLUNT (TROCAR) IMPLANT

## 2023-10-31 NOTE — Anesthesia Postprocedure Evaluation (Signed)
Anesthesia Post Note  Patient: Destiny Graham  Procedure(s) Performed: LAPAROSCOPIC RIGHT HEMI COLECTOMY (Right)     Patient location during evaluation: PACU Anesthesia Type: General Level of consciousness: awake and alert Pain management: pain level controlled Vital Signs Assessment: post-procedure vital signs reviewed and stable Respiratory status: spontaneous breathing, nonlabored ventilation, respiratory function stable and patient connected to nasal cannula oxygen Cardiovascular status: blood pressure returned to baseline and stable Postop Assessment: no apparent nausea or vomiting Anesthetic complications: no   No notable events documented.  Last Vitals:  Vitals:   10/31/23 1432 10/31/23 1535  BP: 129/64 129/69  Pulse: 74 68  Resp: 17 16  Temp: 37 C 36.9 C  SpO2: 100% 100%    Last Pain:  Vitals:   10/31/23 1535  TempSrc: Oral  PainSc:                  Nelle Don Shatasia Cutshaw

## 2023-10-31 NOTE — Anesthesia Procedure Notes (Signed)
Procedure Name: Intubation Date/Time: 10/31/2023 8:40 AM  Performed by: Jamelle Rushing, CRNAPre-anesthesia Checklist: Patient identified, Emergency Drugs available, Suction available, Patient being monitored and Timeout performed Patient Re-evaluated:Patient Re-evaluated prior to induction Oxygen Delivery Method: Circle system utilized Preoxygenation: Pre-oxygenation with 100% oxygen Induction Type: IV induction Ventilation: Mask ventilation without difficulty Laryngoscope Size: Mac and 3 Grade View: Grade II Tube type: Oral Tube size: 7.0 mm Number of attempts: 1 Airway Equipment and Method: Stylet Placement Confirmation: ETT inserted through vocal cords under direct vision, positive ETCO2, CO2 detector and breath sounds checked- equal and bilateral Secured at: 21 cm Tube secured with: Tape Dental Injury: Teeth and Oropharynx as per pre-operative assessment

## 2023-10-31 NOTE — Transfer of Care (Signed)
Immediate Anesthesia Transfer of Care Note  Patient: Destiny Graham  Procedure(s) Performed: LAPAROSCOPIC RIGHT HEMI COLECTOMY (Right)  Patient Location: PACU  Anesthesia Type:General  Level of Consciousness: drowsy  Airway & Oxygen Therapy: Patient Spontanous Breathing and Patient connected to face mask oxygen  Post-op Assessment: Report given to RN and Post -op Vital signs reviewed and stable  Post vital signs: Reviewed and stable  Last Vitals:  Vitals Value Taken Time  BP 116/66 10/31/23 1028  Temp    Pulse 71 10/31/23 1028  Resp 15 10/31/23 1029  SpO2 100 % 10/31/23 1028  Vitals shown include unfiled device data.  Last Pain:  Vitals:   10/31/23 0650  TempSrc: Oral         Complications: No notable events documented.

## 2023-10-31 NOTE — H&P (Signed)
CC: Here today for surgery  HPI: Destiny Graham is an 68 y.o. female with history of HTN, hypothyroidism, GERD, dysthymia whom is seen in the office today as a referral by Dr. Barron Alvine for evaluation of endoscopically unresectable polyp.   Colonoscopy with Dr. Barron Alvine 08/29/2023: - One 20 mm polyp in the ascending colon. Attempted endoscopic mucosal resection using saline injection-lift and a hot snare, but the resection was ultimately incomplete due to central tethering of the polyp. The resected tissue retrieved. Tattooed. - One 5 mm polyp in the sigmoid colon, removed with a cold snare. Resected and retrieved. - Diverticulosis in the sigmoid colon, in the descending colon, in the transverse colon and in the ascending colon. - Normal mucosa in the entire examined colon. Biopsied. - The distal rectum and anal verge are normal on retroflexion view. - The examined portion of the ileum was normal.  PATH 1. Surgical [P], duodenal : - SMALL INTESTINAL MUCOSA WITH NO SIGNIFICANT PATHOLOGY.  2. Surgical [P], gastric-gastritis : - ANTRAL MUCOSA WITH FEATURES OF BOTH MILD CHRONIC INACTIVE GASTRITIS AND CHEMICAL/REACTIVE CHANGE. - OXYNTIC MUCOSA WITH NO SIGNIFICANT PATHOLOGY. - IMMUNOHISTOCHEMICAL STAIN FOR HELICOBACTER PYLORI ORGANISMS IS PENDING AND WILL BE REPORTED IN ADDENDUM.  3. Surgical [P], colon, ascending, polyp (1) : - TUBULAR ADENOMA, FRAGMENTS.  4. Surgical [P], right colon bx : - COLONIC MUCOSA WITH NO SIGNIFICANT PATHOLOGY.  5. Surgical [P], left colon : - COLONIC MUCOSA WITH NO SIGNIFICANT PATHOLOGY.  6. Surgical [P], colon, sigmoid, polyp (1) : - TUBULAR ADENOMA.   She reports that she had 2 prior colonoscopies at 49 and 59 that were both normal. She reports this colonoscopy was done slightly early as she was having some change in bowel habits including some occasional cramping/bloating and occasional diarrhea. She also had some mild dysphagia-like symptoms. For  that reason she underwent the above endoscopic procedures. Denies any history of bright red blood per rectum.  PMH: HTN, hypothyroidism, GERD, dysthymia  PSH: Laparoscopic appendectomy for perforated gangrenous appendicitis with feculent peritonitis-Dr. Doylene Canard. Myomectomy via Pfannenstiel for a large polyp complicated by uterine artery bleeding by her account.  FHx: Sister had gastric cancer and was reportedly genetic tested and negative; denies any known family history of colorectal, breast, endometrial or ovarian cancer  Social Hx: Denies use of tobacco/illicit drug use; social EtOH use. She is here today with her daughter. [place]. 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   She denies any changes in health or health history since we met in the office. No new medications/allergies. She states she is ready for surgery today. Tolerated bowel prep with satisfactory result  Past Medical History:  Diagnosis Date   Anxiety    Phreesia 07/27/2020   Arthritis    Phreesia 07/27/2020   Breast nodule    Cataract    Phreesia 07/27/2020   DDD (degenerative disc disease), cervical    Dysthymia    Generalized OA    GERD (gastroesophageal reflux disease)    Hallux rigidus    Heart murmur    beneign   Hyperlipidemia    Hypertension    Hypothyroidism    OSA on CPAP    Does not use a cpap   Pre-diabetes    Pseudophakia    Sleep apnea    Phreesia 07/27/2020   Thyroid disease    Uterine fibroid     Past Surgical History:  Procedure Laterality Date  BUNIONECTOMY     CATARACT EXTRACTION  2016   COLONOSCOPY     EYE SURGERY     FRACTURE SURGERY N/A    Phreesia 07/27/2020   fusion   2015   first MTP B   HARDWARE REMOVAL Left 06/15/2017   Procedure: Left Great Toe Hardware Removal;  Surgeon: Nadara Mustard, MD;  Location: [facility] OR;  Service: Orthopedics;  Laterality: Left;   LAPAROSCOPIC APPENDECTOMY N/A 02/01/2022   Procedure:  APPENDECTOMY LAPAROSCOPIC;  Surgeon: Berna Bue, MD;  Location: [facility] OR;  Service: General;  Laterality: N/A;   MYOMECTOMY  1992   ORIF ANKLE FRACTURE Left 01/16/2023   Procedure: OPEN REDUCTION INTERNAL FIXATION (ORIF) LEFT ANKLE FRACTURE AND SYNDESMOSIS;  Surgeon: Terance Hart, MD;  Location: [facility] OR;  Service: Orthopedics;  Laterality: Left;   ORIF FINGER FRACTURE  2006   PAROTIDECTOMY Right 05/14/2020   Procedure: RIGHT SUPERFICIAL PAROTIDECTOMY;  Surgeon: Osborn Coho, MD;  Location: [facility] OR;  Service: ENT;  Laterality: Right;  REQUESTING RNFA   THYROIDECTOMY  2005   TOENAIL EXCISION Right 06/15/2017   Procedure: Right Great Toe Excision Lateral Border Nail;  Surgeon: Nadara Mustard, MD;  Location: [facility] OR;  Service: Orthopedics;  Laterality: Right;   TONSILLECTOMY  1960    Family History  Problem Relation Age of Onset   Arthritis Mother    Depression Mother    COPD Father    Hearing loss Father    Heart disease Father    Hyperlipidemia Father    Hypertension Father    Kidney disease Father    Alcohol abuse Father    Depression Sister    Alcohol abuse Sister    Depression Sister    Heart disease Sister    Hyperlipidemia Sister    Depression Sister    Heart disease Sister    Hyperlipidemia Sister    Heart disease Maternal Grandmother        CHF   COPD Maternal Grandfather    Alcohol abuse Paternal Grandmother    Colon cancer Neg Hx    Stomach cancer Neg Hx    Esophageal varices Neg Hx     Social:  reports that she has never smoked. She has never used smokeless tobacco. She reports current alcohol use of about 6.0 standard drinks of alcohol per week. She reports that she does not use drugs.  Allergies:  Allergies  Allergen Reactions   Sulfamethoxazole-Trimethoprim Other (See Comments)    [name] Rash   Shingrix [Zoster Vac Recomb Adjuvanted] Other (See Comments)    Localized reaction with fever, hypotension, vertigo   Vioxx [Rofecoxib] Other  (See Comments)    Photodermatitis    Hydrochlorothiazide Other (See Comments)    Excessive sweating    Medications: I have reviewed the patient's current medications.  No results found for this or any previous visit (from the past 48 hours).  No results found.   PE Blood pressure (!) 157/73, pulse 64, temperature 99 F (37.2 C), temperature source Oral, resp. rate 18, height 5\' 7"  (1.702 m), weight 81.3 kg, SpO2 97%. Constitutional: NAD; conversant Eyes: Moist conjunctiva; no lid lag; anicteric Lungs: Normal respiratory effort CV: RRR GI: Abd soft, NT/ND Psychiatric: Appropriate affect  No results found for this or any previous visit (from the past 48 hours).  No results found.  A/P: Destiny Graham is an 68 y.o. female with hx of HTN, hypothyroidism, GERD, dysthymia here for evaluation of endoscopically unresectable polyp in the ascending colon, tattooed.  Pathology did demonstrate tubular adenoma but they did not note any evident dysplasia on the sample submitted.  -The anatomy and physiology of the GI tract was reviewed with her and her daughter. The pathophysiology of colon polyps and even cancer was discussed as well with associated pictures.  -She has previously discussed with Dr. Barron Alvine again with me in office the possibility of referral to [facility] in [facility] with gastroenterology to evaluate for candidacy of advanced polypectomy with one of their advanced endoscopist. She much prefers to proceed with surgical removal based on the polyp appearance and her discussions with Dr. Barron Alvine. For her stated reasons this seems reasonable to me as well.  -We have therefore discussed various surgical treatment options going forward to address this - laparoscopic right hemicolectomy, possible lysis of adhesions [she has hx of perforated appendicitis with feculent peritonitis]. We also reviewed situations where 'open' surgery could be necessary and expectations therein  -The planned  procedure, material risks (including, but not limited to, pain, bleeding, infection, scarring, need for blood transfusion, damage to surrounding structures- blood vessels/nerves/viscus/organs, damage to ureter, urine leak, leak from anastomosis, need for additional procedures,scenarios where a stoma may be necessary and where it may be permanent, worsening of pre-existing medical conditions, chronic diarrhea, constipation secondary to narcotic use, hernia, recurrence, pneumonia, heart attack, stroke, death) benefits and alternatives to surgery were discussed at length. The patient's questions were answered to her satisfaction, she voiced understanding and elected to proceed with surgery. Additionally, we discussed typical postoperative expectations and the recovery process.   Marin Olp, MD [facility], [facility]

## 2023-10-31 NOTE — Op Note (Signed)
PATIENT: Destiny Graham  68 y.o. female  Patient Care Team: Christen Butter, NP as PCP - General (Nurse Practitioner)  PREOP DIAGNOSIS: Ascending colon polypoid mass  POSTOP DIAGNOSIS: Same  PROCEDURE: Laparoscopic right hemicolectomy  SURGEON: Stephanie Coup. Tulip Meharg, MD  ASSISTANT: Romie Levee, MD  An experienced assistant was required given the complexity of this procedure and the standard of surgical care. My assistant helped with exposure through counter tension, suctioning, ligation and retraction to better visualize the surgical field. My assistant expedited sewing during the case by following my sutures. Wherever I use the term "we" in the report, my assistant actively helped me with that portion of the procedure.   ANESTHESIA: General endotracheal  EBL: 30 mL Total I/O In: 1016 [I.V.:916; IV Piggyback:100] Out: 300 [Urine:270; Blood:30]  DRAINS: none  SPECIMEN: Right colon (including terminal ileum, cecum, ascending colon, proximal transverse colon all en bloc)  COUNTS: Sponge, needle and instrument counts were reported correct x2  FINDINGS:  Tattoo in distal ascending colon at level of hepatic flexure.  Some adhesions along the cecum and ascending colon related to both tattoo and likely prior periphery appendicitis.  Otherwise, abdomen is free of adhesions.  Normal peritoneal surfaces.  Laparoscopic right hemicolectomy carried out uneventfully  NARRATIVE:  The patient was identified & brought into the operating room, placed supine on the operating table and SCDs were applied to the lower extremities. General endotracheal anesthesia was induced. The patient was positioned supine with arms tucked. Antibiotics were administered. A foley catheter was placed under sterile conditions. Hair in the region of planned surgery was clipped. The abdomen was prepped and draped in a sterile fashion. A timeout was performed confirming our patient and plan.   Beginning with the extraction  port, a supraumbilical incision was made and carried down to the midline fascia. This was then incised with electrocautery. The peritoneum was identified and elevated between clamps and carefully opened sharply. A small Alexis wound protector with a cap and associated port was then placed. The abdomen was insufflated to 15 mmHg with Co2. A laparoscope was placed and camera inspection revealed no evidence of injury. Bilateral TAP blocks were then performed under laparoscopic visualization using a mixture of 0.25% marcaine with epinepherine + Exparel. 3 additional ports were then placed under direct laparoscopic visualization - two in the left hemiabdomen and one in the right abdomen. The abdomen was surveyed. The liver and peritoneum appeared normal.  There were no signs of metastatic disease.  There are some adhesions noted along the ascending colon and cecum likely related to both prior perforated appendicitis and additionally an evident tattoo noted in the distal ascending colon at the level of the hepatic flexure.  She was positioned in trendelenburg with left side down. The ileocolic pedicle was identified and elevated.  Peritoneum was scored.  Blunt dissection is then commenced around the ileocolic pedicle.  We are able to identify the duodenum medially and sweep this "down."  The acing mesocolon is further mobilized separating it from the underlying retroperitoneal tissue without difficulty.  We maintained an avascular dissection at this level.  This was carried all the way up to the hepatic flexure.  The ileocolic pedicle was then circumferentially dissected and freed.  The duodenum was confirmed to be well away from our planned dissection.  The ileocolic pedicle was then sealed and divided using the laparoscopic Enseal device.  The stump was inspected and noted to be hemostatic.  Attention is then directed at the lateral portion of the  dissection.  We retracted the cecum medially.  The Pierina Schuknecht line of  Toldt is incised.  Adhesions of the cecum were carefully taken down from abdominal wall sharply.  The Jaley Yan line of Toldt was then further dissected cephalad.  Attachments at the level of the tattoo are noted and are able to be freed without difficulty.  The ascending colon was fully mobilized by incising the Sorah Falkenstein line of Toldt all the way up to the hepatic flexure.  She is then repositioned in reverse Trendelenburg.  The omentum was retracted anteriorly and the Veress colon is retracted caudad.  Were able to dissect the omentum free of the mid and proximal transverse colon.  The hepatic flexure was then fully mobilized and this approach as well.  At this point, there is more than adequate reach of the distal ileum into the left upper quadrant.  At this point, the abdomen was desufflated and the terminal ileum and right colon delivered through the wound protector. The terminal ileum was then transected using a GIA blue load stapler. The remaining mesentery was divided using the Enseal device. The divided mesentery was inspected and noted to be hemostatic. The distal point of transection was then identified on the transverse colon at a location that included the right branch of the middle colics, leaving the main middle colic feeding the remaining transverse colon. This was transected using another blue load GIA stapler.  The specimen was then passed off. We then turned our attention to creating the anastomosis. The terminal ileum and transverse colon were inspected for orientation to ensure no twisting nor bowel included in the mesenteric defect. An anastomosis was created between the terminal ileum and the transverse colon using a 75 mm GIA blue load stapler. The staple line was inspected and noted to be hemostatic.  The common enterotomy channel was closed using a TA 60 blue load stapler. Hemostasis was achieved at the staple line using 3-0 silk U-stitches. 3-0 silk sutures were used to imbricate the corners  of the staple line as well.  A 2-0 silk suture was placed securing the "crotch" of the anastomosis. The anastomosis was palpated and noted to be widely patent. This was then placed back into the abdomen. The abdomen was then irrigated with sterile saline and hemostasis verified. The omentum was then brought down over the anastomosis. The wound protector cap was replaced and CO2 reinsufflated. The laparoscopic ports were removed under direct visualization and the sites noted to be hemostatic. The Alexis wound protector was removed, counts were reported correct, and we switched to clean instruments, gowns and drapes.   The fascia was then closed using two running #1 PDS sutures.  The skin of all incision sites was closed with 4-0 monocryl subcuticular suture. Dermabond was placed on the port sites and a sterile dressing was placed over the abdominal incision. All counts were reported correct. She was then awakened from anesthesia and sent to the post anesthesia care unit in stable condition.   DISPOSITION: PACU in satisfactory condition

## 2023-11-01 ENCOUNTER — Other Ambulatory Visit (HOSPITAL_COMMUNITY): Payer: Self-pay

## 2023-11-01 ENCOUNTER — Other Ambulatory Visit: Payer: Self-pay

## 2023-11-01 ENCOUNTER — Encounter (HOSPITAL_COMMUNITY): Payer: Self-pay | Admitting: Surgery

## 2023-11-01 LAB — BASIC METABOLIC PANEL
Anion gap: 7 (ref 5–15)
BUN: 9 mg/dL (ref 8–23)
CO2: 23 mmol/L (ref 22–32)
Calcium: 8.4 mg/dL — ABNORMAL LOW (ref 8.9–10.3)
Chloride: 106 mmol/L (ref 98–111)
Creatinine, Ser: 0.85 mg/dL (ref 0.44–1.00)
GFR, Estimated: >60 mL/min (ref >60)
Glucose, Bld: 128 mg/dL — ABNORMAL HIGH (ref 70–99)
Potassium: 4.1 mmol/L (ref 3.5–5.1)
Sodium: 136 mmol/L (ref 135–145)

## 2023-11-01 LAB — CBC
HCT: 36 % (ref 36.0–46.0)
Hemoglobin: 11.5 g/dL — ABNORMAL LOW (ref 12.0–15.0)
MCH: 31.1 pg (ref 26.0–34.0)
MCHC: 31.9 g/dL (ref 30.0–36.0)
MCV: 97.3 fL (ref 80.0–100.0)
Platelets: 242 10*3/uL (ref 150–400)
RBC: 3.7 MIL/uL — ABNORMAL LOW (ref 3.87–5.11)
RDW: 12.9 % (ref 11.5–15.5)
WBC: 7.2 10*3/uL (ref 4.0–10.5)
nRBC: 0 % (ref 0.0–0.2)

## 2023-11-01 LAB — SURGICAL PATHOLOGY

## 2023-11-01 MED ORDER — TRAMADOL HCL 50 MG PO TABS
50.0000 mg | ORAL_TABLET | Freq: Four times a day (QID) | ORAL | 0 refills | Status: AC | PRN
  Filled 2023-11-01 (×2): qty 15, 4d supply, fill #0

## 2023-11-01 NOTE — Discharge Summary (Signed)
Patient ID: Destiny Graham MRN: [id number] DOB/AGE: 09/20/1955 68 y.o.  Admit date: 10/31/2023 Discharge date: 11/01/2023  Discharge Diagnoses Patient Active Problem List   Diagnosis Date Noted   S/P right hemicolectomy 10/31/2023   Dysthymia 04/20/2023   Closed nondisplaced fracture of proximal phalanx of lesser toe of left foot 04/27/2021   Grief reaction 03/21/2021   Anxiety 03/21/2021   Psychophysiological insomnia 03/21/2021   Lung nodule seen on imaging study 07/28/2020   Parotid mass 05/14/2020   Stress fracture of right calcaneus 06/04/2017   Ingrown nail of great toe of right foot 06/04/2017   Pain from implanted hardware 06/04/2017   OSA on CPAP 10/10/2016   GERD (gastroesophageal reflux disease) 07/26/2016   DDD (degenerative disc disease), cervical 07/26/2016   Generalized OA 07/26/2016   Hypothyroidism 07/25/2016   Essential hypertension 07/25/2016   Hyperlipidemia 07/25/2016   MDD (major depressive disorder), recurrent episode, moderate (HCC) 07/25/2016    Consultants None  Procedures Laparoscopic right hemicolectomy 10/31/2023  Hospital Course: She was admitted postoperatively and did well. Her diet was gradually advanced which she tolerated.  She began having spontaneous return of bowel function including flatus and multiple bowel movements by POD#1.  She was advanced to a soft/regular diet and tolerated this well.  On 11/01/2023, she is motivated and stable for discharge home.  Postoperative expectations have been reviewed.  Questions were answered.  Follow-up my office has been arranged.    Allergies as of 11/01/2023       Reactions   Sulfamethoxazole-trimethoprim Other (See Comments)   [name] Rash   Shingrix [zoster Vac Recomb Adjuvanted] Other (See Comments)   Localized reaction with fever, hypotension, vertigo   Vioxx [rofecoxib] Other (See Comments)   Photodermatitis   Hydrochlorothiazide Other (See Comments)   Excessive sweating         Medication List     TAKE these medications    acetaminophen 500 MG tablet Commonly known as: TYLENOL Take 1,000 mg by mouth every 6 (six) hours as needed for mild pain or headache.   atenolol 50 MG tablet Commonly known as: TENORMIN Take 1 tablet (50 mg total) by mouth daily. What changed: Another medication with the same name was changed. Make sure you understand how and when to take each.   atenolol 25 MG tablet Commonly known as: TENORMIN Take 1 tablet (25 mg total) by mouth daily. Take in addition to the 50 mg dose once daily. What changed:  when to take this additional instructions   bisacodyl 5 MG EC tablet Generic drug: bisacodyl Take 4 tablets (20 mg total) by mouth once daily as needed for Constipation for up to 1 dose   DULoxetine 60 MG capsule Commonly known as: CYMBALTA Take 60 mg by mouth daily. What changed: Another medication with the same name was removed. Continue taking this medication, and follow the directions you see here.   estradiol 0.1 MG/GM vaginal cream Commonly known as: ESTRACE VAGINAL Place 1 Applicatorful vaginally at bedtime. What changed: additional instructions   levothyroxine 150 MCG tablet Commonly known as: SYNTHROID NEEDS APPOINTMENT FOR FURTHER REFILLS. Take 1 tablet ( ) daily for 6 days per week then take 1/2 tablet ( ) on day 7.   metroNIDAZOLE 500 MG tablet Commonly known as: FLAGYL Take 1 tablet (500 mg total) by mouth 3 (three) times daily for 3 doses Take according to your procedure colon prep instructions   naproxen sodium 220 MG tablet Commonly known as: ALEVE Take 440 mg by mouth 2 (two)  times daily as needed (arthritis pain).   neomycin 500 MG tablet Commonly known as: MYCIFRADIN Take 2 tablets (1,000 mg total) by mouth 3 (three) times daily for 3 doses Take according to your procedure colon prep instructions   omeprazole 20 MG capsule Commonly known as: PRILOSEC Take 1 capsule (20 mg total) by mouth  daily.   ondansetron 8 MG disintegrating tablet Commonly known as: ZOFRAN-ODT Take 1 tablet (8 mg total) by mouth every 8 (eight) hours as needed for nausea.   ondansetron 4 MG disintegrating tablet Commonly known as: ZOFRAN-ODT Take 1 tablet (4 mg total) by mouth every 8 (eight) hours as needed for Nausea for up to 7 days   polyethylene glycol powder 17 GM/SCOOP powder Commonly known as: GLYCOLAX/MIRALAX Take 233.75 g by mouth once for 1 dose Take according to your procedure prep instructions.   Renova 0.02 % Crea Generic drug: Tretinoin (Facial Wrinkles) Apply one pea-size amount to the face once daily.   RETIN-A EX Apply 1 application  topically 2 (two) times a week.   rosuvastatin 20 MG tablet Commonly known as: CRESTOR Take 1 tablet (20 mg total) by mouth daily. NEEDS APPOINTMENT FOR FURTHER REFILLS.   SYSTANE COMPLETE OP Place 1 drop into both eyes daily as needed (irritation).   traMADol 50 MG tablet Commonly known as: Ultram Take 1 tablet (50 mg total) by mouth every 6 (six) hours as needed for up to 5 days (postop pain not controlled with tylenol/ibuprofen first).   valsartan 40 MG tablet Commonly known as: DIOVAN Take 1 tablet (40 mg total) by mouth daily.          Follow-up Information     Andria Meuse, MD Follow up on 11/27/2023.   Specialties: General Surgery, Colon and Rectal Surgery Why: Please arrive by 10:00 am Contact information: [address] [place] [address] [phone]                 Stephanie Coup. Cliffton Asters, M.D. [facility]

## 2023-11-01 NOTE — Progress Notes (Signed)
   11/01/23 1018  TOC Brief Assessment  Insurance and Status Reviewed  Patient has primary care physician Yes  Home environment has been reviewed resides alone in private residence  Prior level of function: Independent  Prior/Current Home Services No current home services  Social Drivers of Health Review SDOH reviewed no interventions necessary  Readmission risk has been reviewed Yes  Transition of care needs no transition of care needs at this time

## 2023-11-01 NOTE — Progress Notes (Signed)
AVS reviewed w/ pt who verbalized an understanding- no other questions at this time. PIV removed as noted. Pt dressing for d/c to home. To lobby via w/c w/ her sister

## 2023-11-01 NOTE — Progress Notes (Signed)
Subjective No acute events. Feeling great. No complaints. Tolerating liquids without trouble. Multiple BM and flatus. Pain controlled.  Objective: Vital signs in last 24 hours: Temp:  [97.4 F (36.3 C)-98.6 F (37 C)] 97.8 F (36.6 C) (02/20 0630) Pulse Rate:  [68-79] 77 (02/20 0630) Resp:  [13-21] 16 (02/20 0630) BP: (101-148)/(59-80) 140/71 (02/20 0630) SpO2:  [95 %-100 %] 98 % (02/20 0630) Last BM Date : 11/01/23  Intake/Output from previous day: 02/19 0701 - 02/20 0700 In: 2815.8 [P.O.:690; I.V.:2025.8; IV Piggyback:100] Out: 3055 [Urine:3020; Stool:5; Blood:30] Intake/Output this shift: No intake/output data recorded.  Gen: NAD, comfortable CV: RRR Pulm: Normal work of breathing Abd: Soft, ND - appropriate incisional soreness Ext: SCDs in place  Lab Results: CBC  Recent Labs    11/01/23 0452  WBC 7.2  HGB 11.5*  HCT 36.0  PLT 242   BMET Recent Labs    11/01/23 0452  NA 136  K 4.1  CL 106  CO2 23  GLUCOSE 128*  BUN 9  CREATININE 0.85  CALCIUM 8.4*   PT/INR No results for input(s): "LABPROT", "INR" in the last 72 hours. ABG No results for input(s): "PHART", "HCO3" in the last 72 hours.  Invalid input(s): "PCO2", "PO2"  Studies/Results:  Anti-infectives: Anti-infectives (From admission, onward)    Start     Dose/Rate Route Frequency Ordered Stop   10/31/23 1400  neomycin (MYCIFRADIN) tablet 1,000 mg  Status:  Discontinued       Placed in "And" Linked Group   1,000 mg Oral 3 times per day 10/31/23 0640 10/31/23 0648   10/31/23 1400  metroNIDAZOLE (FLAGYL) tablet 1,000 mg  Status:  Discontinued       Placed in "And" Linked Group   1,000 mg Oral 3 times per day 10/31/23 0640 10/31/23 0648   10/31/23 0645  cefoTEtan (CEFOTAN) 2 g in sodium chloride 0.9 % 100 mL IVPB        2 g 200 mL/hr over 30 Minutes Intravenous On call to O.R. 10/31/23 0640 10/31/23 0831        Assessment/Plan: Patient Active Problem List   Diagnosis Date Noted   S/P  right hemicolectomy 10/31/2023   Dysthymia 04/20/2023   Closed nondisplaced fracture of proximal phalanx of lesser toe of left foot 04/27/2021   Grief reaction 03/21/2021   Anxiety 03/21/2021   Psychophysiological insomnia 03/21/2021   Lung nodule seen on imaging study 07/28/2020   Parotid mass 05/14/2020   Stress fracture of right calcaneus 06/04/2017   Ingrown nail of great toe of right foot 06/04/2017   Pain from implanted hardware 06/04/2017   OSA on CPAP 10/10/2016   GERD (gastroesophageal reflux disease) 07/26/2016   DDD (degenerative disc disease), cervical 07/26/2016   Generalized OA 07/26/2016   Hypothyroidism 07/25/2016   Essential hypertension 07/25/2016   Hyperlipidemia 07/25/2016   MDD (major depressive disorder), recurrent episode, moderate (HCC) 07/25/2016   s/p Procedure(s): LAPAROSCOPIC RIGHT HEMI COLECTOMY 10/31/2023  - Advance to soft diet - Previously has taken Cymbalta and tramadol without issue - D/C Entereg - D/C IVF - D/C Foley - PPX: SQH, SCDs  We have spent time reviewing her procedure, findings and plans. Discussed general expectations as well. All questions were answered. She is highly motivated to go home today if tolerating soft diet. I have reviewed this with her nurse and if she's doing well after lunch, will consider discharge today   LOS: 1 day   Marin Olp, MD [facility]

## 2023-11-01 NOTE — Progress Notes (Signed)
TOC discharge med in a secure bag delivered to pt in room by this RN . Pt asked when she could be discharged- pt states she has voided multiple times- 300 cc clear yellow urine noted in hat - primary RN updated by this RN.

## 2023-11-01 NOTE — Discharge Instructions (Signed)

## 2023-11-06 ENCOUNTER — Other Ambulatory Visit: Payer: Self-pay | Admitting: Medical-Surgical

## 2023-11-06 ENCOUNTER — Encounter: Payer: Self-pay | Admitting: Medical-Surgical

## 2023-11-07 ENCOUNTER — Other Ambulatory Visit (HOSPITAL_COMMUNITY): Payer: Self-pay

## 2023-11-07 MED ORDER — ROSUVASTATIN CALCIUM 20 MG PO TABS
20.0000 mg | ORAL_TABLET | Freq: Every day | ORAL | 0 refills | Status: DC
  Filled 2023-11-07 – 2023-11-12 (×2): qty 90, 90d supply, fill #0

## 2023-11-11 ENCOUNTER — Other Ambulatory Visit: Payer: Self-pay | Admitting: Medical-Surgical

## 2023-11-12 ENCOUNTER — Other Ambulatory Visit: Payer: Self-pay

## 2023-11-12 ENCOUNTER — Other Ambulatory Visit (HOSPITAL_COMMUNITY): Payer: Self-pay

## 2023-11-13 ENCOUNTER — Other Ambulatory Visit (HOSPITAL_COMMUNITY): Payer: Self-pay

## 2023-11-13 ENCOUNTER — Other Ambulatory Visit: Payer: Self-pay

## 2023-11-13 MED ORDER — ACETAMINOPHEN 500 MG PO TABS
1000.0000 mg | ORAL_TABLET | Freq: Four times a day (QID) | ORAL | 1 refills | Status: AC | PRN
  Filled 2023-11-13: qty 30, 4d supply, fill #0

## 2023-11-13 MED ORDER — OMEPRAZOLE 20 MG PO CPDR
20.0000 mg | DELAYED_RELEASE_CAPSULE | Freq: Every day | ORAL | 0 refills | Status: DC
  Filled 2023-11-13: qty 90, 90d supply, fill #0

## 2023-11-13 NOTE — Telephone Encounter (Signed)
 Requesting rx rf of acetaminophen 500mg   Last written  by historical provider.  Last OV 04/20/2023 Upcoming APPT = none

## 2023-11-20 ENCOUNTER — Other Ambulatory Visit: Payer: Self-pay | Admitting: Medical-Surgical

## 2023-11-20 DIAGNOSIS — E890 Postprocedural hypothyroidism: Secondary | ICD-10-CM | POA: Diagnosis not present

## 2023-11-21 ENCOUNTER — Ambulatory Visit: Payer: Commercial Managed Care - PPO | Admitting: Gastroenterology

## 2023-11-21 ENCOUNTER — Other Ambulatory Visit: Payer: Self-pay

## 2023-11-21 ENCOUNTER — Other Ambulatory Visit (HOSPITAL_COMMUNITY): Payer: Self-pay

## 2023-11-21 ENCOUNTER — Encounter: Payer: Self-pay | Admitting: Medical-Surgical

## 2023-11-21 ENCOUNTER — Encounter: Payer: Self-pay | Admitting: Gastroenterology

## 2023-11-21 ENCOUNTER — Other Ambulatory Visit: Payer: Self-pay | Admitting: Medical-Surgical

## 2023-11-21 VITALS — BP 100/70 | HR 73 | Ht 67.0 in | Wt 174.4 lb

## 2023-11-21 DIAGNOSIS — Z860101 Personal history of adenomatous and serrated colon polyps: Secondary | ICD-10-CM | POA: Diagnosis not present

## 2023-11-21 DIAGNOSIS — R1310 Dysphagia, unspecified: Secondary | ICD-10-CM

## 2023-11-21 DIAGNOSIS — Z860100 Personal history of colon polyps, unspecified: Secondary | ICD-10-CM

## 2023-11-21 DIAGNOSIS — R1110 Vomiting, unspecified: Secondary | ICD-10-CM

## 2023-11-21 DIAGNOSIS — K219 Gastro-esophageal reflux disease without esophagitis: Secondary | ICD-10-CM

## 2023-11-21 LAB — TSH: TSH: 0.044 u[IU]/mL — ABNORMAL LOW (ref 0.450–4.500)

## 2023-11-21 MED ORDER — RABEPRAZOLE SODIUM 20 MG PO TBEC: DELAYED_RELEASE_TABLET | ORAL | 3 refills | Status: DC

## 2023-11-21 MED ORDER — LEVOTHYROXINE SODIUM 125 MCG PO TABS: 125.0000 ug | ORAL_TABLET | Freq: Every day | ORAL | 0 refills | Status: DC

## 2023-11-21 MED ORDER — LEVOTHYROXINE SODIUM 150 MCG PO TABS
ORAL_TABLET | ORAL | 0 refills | Status: DC
  Filled 2023-11-21: qty 15, 17d supply, fill #0

## 2023-11-21 NOTE — Progress Notes (Unsigned)
 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  Longstanding history of GERD, with index symptoms of regurgitation.  Previously failed Protonix.  Has been taking omeprazole 20 mg daily for years, and will occasionally use a second dose or Pepcid on demand for breakthrough.  Did have an esophagram approximate 7-8 years ago when she was undergoing evaluation for Nissen which showed full column regurgitation.  She ultimately did not go through with Nissen fundoplication.  2) Dysphagia.  Progressive dysphagia starting in 2024 with episodes every 1-2 weeks.  Underwent EGD with empiric esophageal dilation 08/2023, and subsequent esophagram in 10/2023 as outlined below.   3) History of acute appendicitis with perforation in 01/2022. Underwent emergent laparoscopic appendectomy. Since then, has had lower GI symptoms to include abdominal cramping, tenesmus, loose stools, and episodic incontinence. No hematochezia or melena. No change with trial of probiotics, dietary modification. - 07/2023: Normal ESR, CRP, pancreatic elastase  4) Colon polyps.  Colonoscopy in 08/2023 with large 20 mm flat polyp in the ascending colon which was not endoscopically resectable.  Was referred for right hemicolectomy by Dr. Cliffton Asters, completed 10/2023.  Path demonstrates adenoma without high-grade dysplasia or malignancy and 0/15 lymph nodes involved.  Family history notable for sister with metastatic gastric cancer.    - 08/29/2023: EGD: Normal esophagus dilated with 17 mm Savary dilator, normal Z-line, minimal non-H. pylori gastritis, normal duodenum (path benign) - 08/29/2023: Colonoscopy: 20 mm flat ascending colon polyp, not endoscopically resectable due to central tethering (path tubular adenoma).  5 mm sigmoid colon adenoma, pandiverticulosis, otherwise normal-appearing  colon mucosa with biopsies negative for microscopic colitis.  Normal TI. - 10/26/2023: Barium esophagram: Small hiatal hernia, small amounts of contrast pools just above cricopharyngeal muscle which may be from a very small Zenker's diverticulum.  Otherwise normal esophagus with normal motility.  HPI:     Patient is a 68 y.o. female presenting to the Gastroenterology Clinic for follow-up.    Underwent laparoscopic right hemicolectomy on 10/31/2023 with Dr. Cliffton Asters.  Surgical pathology notable for tubular adenoma without high-grade dysplasia or carcinoma, clear margins, and 0/15 lymph nodes involved.  Recovering well since surgery.  Has mostly maintained a light diet.  Has had increase in reflux symptoms since surgery.  She increased her omeprazole to 20 mg bid along with Pepcid 20 mg every morning along with a nighttime dose on demand.  Presents to the office with her daughter.  Review of systems:     No chest pain, no SOB, no fevers, no urinary sx   Past Medical History:  Diagnosis Date   Anxiety    [employer] 07/27/2020   Arthritis    [employer] 07/27/2020   Breast nodule    Cataract    [employer] 07/27/2020   DDD (degenerative disc disease), cervical    Dysthymia    Generalized OA    GERD (gastroesophageal reflux disease)    Hallux rigidus    Heart murmur    beneign   Hyperlipidemia    Hypertension    Hypothyroidism    OSA on CPAP    Does not use a cpap   Pre-diabetes    Pseudophakia    Sleep apnea    [employer] 07/27/2020   Thyroid disease    Uterine fibroid     Patient's surgical history, family medical history, social  history, medications and allergies were all reviewed in Epic    Current Outpatient Medications  Medication Sig Dispense Refill   acetaminophen (TYLENOL) 500 MG tablet Take 2 tablets (1,000 mg total) by mouth every 6 (six) hours as needed for mild pain (pain score 1-3) or headache. 30 tablet 1   atenolol (TENORMIN) 25 MG tablet Take 1 tablet (25 mg total) by  mouth daily. Take in addition to the 50 mg dose once daily. (Patient taking differently: Take 25 mg by mouth at bedtime. In addition to 50mg  dose in the morning.) 90 tablet 3   atenolol (TENORMIN) 50 MG tablet Take 1 tablet (50 mg total) by mouth daily. 90 tablet 3   DULoxetine (CYMBALTA) 60 MG capsule Take 60 mg by mouth daily.     estradiol (ESTRACE VAGINAL) 0.1 MG/GM vaginal cream Place 1 Applicatorful vaginally at bedtime. (Patient taking differently: Place 1 Applicatorful vaginally at bedtime. 3 times a week) 42.5 g 12   levothyroxine (SYNTHROID) 150 MCG tablet NEEDS APPOINTMENT FOR FURTHER REFILLS. Take 1 tablet ( ) daily for 6 days per week then take 1/2 tablet ( ) on day 7. 30 tablet 0   naproxen sodium (ALEVE) 220 MG tablet Take 440 mg by mouth 2 (two) times daily as needed (arthritis pain).     omeprazole (PRILOSEC) 20 MG capsule Take 1 capsule (20 mg total) by mouth daily. 90 capsule 0   ondansetron (ZOFRAN-ODT) 8 MG disintegrating tablet Take 1 tablet (8 mg total) by mouth every 8 (eight) hours as needed for nausea. 20 tablet 3   Propylene Glycol (SYSTANE COMPLETE OP) Place 1 drop into both eyes daily as needed (irritation).     rosuvastatin (CRESTOR) 20 MG tablet Take 1 tablet (20 mg total) by mouth daily. NEEDS APPOINTMENT FOR FURTHER REFILLS. 90 tablet 0   Tretinoin (RETIN-A EX) Apply 1 application  topically 2 (two) times a week.     Tretinoin, Facial Wrinkles, (RENOVA) 0.02 % CREA Apply one pea-size amount to the face once daily. 60 g 5   valsartan (DIOVAN) 40 MG tablet Take 1 tablet (40 mg total) by mouth daily. 90 tablet 3   No current facility-administered medications for this visit.    Physical Exam:     BP 100/70 (BP Location: Left Arm, Patient Position: Sitting, Cuff Size: Normal)   Pulse 73   Ht 5\' 7"  (1.702 m)   Wt 174 lb 6.4 oz (79.1 kg)   BMI 27.31 kg/m   GENERAL:  Pleasant female in NAD PSYCH: : Cooperative, normal affect NEURO: Alert and oriented x  3, no focal neurologic deficits   IMPRESSION and PLAN:    1) GERD 2) Regurgitation Longstanding history of reflux, with index symptoms of regurgitation.  EGD in 08/2023 with normal-appearing esophagus and normal Z-line.  Subsequent esophagram in 10/2023 with reported small hiatal hernia, but no spontaneous refluxate noted (possibly has a very small sliding grade 1 hiatal hernia).  Increasing reflux symptoms since right hemicolectomy in 10/2023.  - Change to Aciphex 20 mg bid x 6 weeks.  If symptoms well-controlled, will reduce to 20 mg daily and can titrate from there (previously failed Protonix and now omeprazole) - Continue antireflux lifestyle/dietary modifications - Continue Pepcid, but can hopefully wean after getting the Aciphex started  3) History of colon polyps Colonoscopy in 08/2023 with large 20 mm flat polyp in the ascending colon which was not endoscopically resectable (and another subcentimeter adenoma which was resected).  Was referred for right hemicolectomy by Dr. Cliffton Asters,  completed 10/2023.  Path demonstrates adenoma without high-grade dysplasia or malignancy and 0/15 lymph nodes involved. - Repeat colonoscopy in 08/2026 for ongoing surveillance   Can otherwise follow-up with me on an as-needed basis.           Shellia Cleverly ,DO, FACG 11/21/2023, 11:46 AM

## 2023-11-21 NOTE — Patient Instructions (Signed)
 _______________________________________________________  If your blood pressure at your visit was 140/90 or greater, please contact your primary care physician to follow up on this. _______________________________________________________  If you are age 69 or older, your body mass index should be between 23-30. Your Body mass index is 27.31 kg/m. If this is out of the aforementioned range listed, please consider follow up with your Primary Care Provider. ________________________________________________________  The [facility] providers would like to encourage you to use [facility] to communicate with providers for non-urgent requests or questions.  Due to long hold times on the telephone, sending your provider a message by [facility] may be a faster and more efficient way to get a response.  Please allow 48 business hours for a response.  Please remember that this is for non-urgent requests.  _______________________________________________________  We have sent the following medications to your pharmacy for you to pick up at your convenience:  START: Aciphex 20mg  one tablet two times daily for 6 weeks, then once daily  You will follow up in our office on an as needed basis.  It was a pleasure to see you today!  Vito Cirigliano, D.O.

## 2023-11-30 ENCOUNTER — Other Ambulatory Visit: Payer: Self-pay

## 2023-11-30 ENCOUNTER — Other Ambulatory Visit (HOSPITAL_COMMUNITY): Payer: Self-pay

## 2023-11-30 ENCOUNTER — Other Ambulatory Visit: Payer: Self-pay | Admitting: Medical-Surgical

## 2023-11-30 MED ORDER — TRETINOIN 0.05 % EX CREA
TOPICAL_CREAM | CUTANEOUS | 3 refills | Status: AC
  Filled 2023-11-30: qty 45, 30d supply, fill #0
  Filled 2024-04-07: qty 45, 30d supply, fill #1
  Filled 2024-09-23 – 2024-10-10 (×2): qty 45, 30d supply, fill #2

## 2023-12-05 NOTE — Progress Notes (Deleted)
 REFERRING PROVIDER: Andria Meuse, MD [address] [place],  [place] [address]   PRIMARY PROVIDER:  Christen Butter, NP  PRIMARY REASON FOR VISIT:  No diagnosis found.   HISTORY OF PRESENT ILLNESS:   Destiny Graham, a 68 y.o. female, was seen for a [facility] cancer genetics consultation at the request of Dr. Cliffton Asters due to a personal history of colon polyps and family history of gastric cancer.  Destiny Graham presents to clinic today to discuss the possibility of a hereditary predisposition to cancer, to discuss genetic testing, and to further clarify her future cancer risks, as well as potential cancer risks for family members.   Destiny Graham is a 68 y.o. female with no personal history of cancer.  In December 2024, Dr. Delton Graham had a colonoscopy that showed two tubular adenomas (20mm in ascending colon and 5mm in sigmoid colon)  The 20mm tubular adenoma was not endoscopically resectable.  Dr. Delton Graham had a right hemicolectomy in February 2025.  Surgical pathology was notable for tubular adenoma without high-grade dysplasia or malignancy and 0/15 lymph nodes involved.  Repeat colonoscopy was recommended in three years.     CANCER HISTORY:  Oncology History   No history exists.     SCREENING/RISK FACTORS:  Mammogram within the last year: *** Number of breast biopsies: {Numbers 1-12 multi-select:20307}. Colonoscopy: {Yes/No-Ex:120004}; {normal/abnormal/not examined:14677}. Hysterectomy: {Yes/No-Ex:120004}.  Ovaries intact: {Yes/No-Ex:120004}.  Menarche was at age ***.  First live birth at age ***.  Menopausal status: {Menopause:31378}.  OCP use for approximately {Numbers 1-12 multi-select:20307} years.  HRT use: {Numbers 1-12 multi-select:20307} years. History of radiation therapy to breast tissue before age 11: {Yes/No-Ex:120004} Dermatology screening: {Yes/No-Ex:120004}  Blood transfusion within past 4 weeks: {Yes/No-Ex:120004} History of bone marrow transplant:  {Yes/No-Ex:120004}  Past Medical History:  Diagnosis Date   Anxiety    Phreesia 07/27/2020   Arthritis    Phreesia 07/27/2020   Breast nodule    Cataract    Phreesia 07/27/2020   DDD (degenerative disc disease), cervical    Dysthymia    Generalized OA    GERD (gastroesophageal reflux disease)    Hallux rigidus    Heart murmur    beneign   Hyperlipidemia    Hypertension    Hypothyroidism    OSA on CPAP    Does not use a cpap   Pre-diabetes    Pseudophakia    Sleep apnea    Phreesia 07/27/2020   Thyroid disease    Uterine fibroid     Past Surgical History:  Procedure Laterality Date   BUNIONECTOMY     CATARACT EXTRACTION  2016   COLONOSCOPY     EYE SURGERY     FRACTURE SURGERY N/A    Phreesia 07/27/2020   fusion   2015   first MTP B   HARDWARE REMOVAL Left 06/15/2017   Procedure: Left Great Toe Hardware Removal;  Surgeon: Nadara Mustard, MD;  Location: [facility] OR;  Service: Orthopedics;  Laterality: Left;   LAPAROSCOPIC APPENDECTOMY N/A 02/01/2022   Procedure: APPENDECTOMY LAPAROSCOPIC;  Surgeon: Berna Bue, MD;  Location: [facility] OR;  Service: General;  Laterality: N/A;   LAPAROSCOPIC RIGHT HEMI COLECTOMY Right 10/31/2023   Procedure: LAPAROSCOPIC RIGHT HEMI COLECTOMY;  Surgeon: Andria Meuse, MD;  Location: [facility] ORS;  Service: General;  Laterality: Right;   MYOMECTOMY  1992   ORIF ANKLE FRACTURE Left 01/16/2023   Procedure: OPEN REDUCTION INTERNAL FIXATION (ORIF) LEFT ANKLE FRACTURE AND SYNDESMOSIS;  Surgeon: Terance Hart, MD;  Location: [facility] OR;  Service: Orthopedics;  Laterality: Left;   ORIF FINGER FRACTURE  2006   PAROTIDECTOMY Right 05/14/2020   Procedure: RIGHT SUPERFICIAL PAROTIDECTOMY;  Surgeon: Osborn Coho, MD;  Location[facility] OR;  Service: ENT;  Laterality: Right;  REQUESTING RNFA   THYROIDECTOMY  2005   TOENAIL EXCISION Right 06/15/2017   Procedure: Right Great Toe Excision Lateral Border Nail;  Surgeon: Nadara Mustard, MD;  Location: [facility] OR;   Service: Orthopedics;  Laterality: Right;   TONSILLECTOMY  1960     FAMILY HISTORY:  We obtained a detailed, 4-generation family history.  Significant diagnoses are listed below: Family History  Problem Relation Age of Onset   Arthritis Mother    Depression Mother    COPD Father    Hearing loss Father    Heart disease Father    Hyperlipidemia Father    Hypertension Father    Kidney disease Father    Alcohol abuse Father    Depression Sister    Alcohol abuse Sister    Depression Sister    Heart disease Sister    Hyperlipidemia Sister    Depression Sister    Heart disease Sister    Hyperlipidemia Sister    Heart disease Maternal Grandmother        CHF   COPD Maternal Grandfather    Alcohol abuse Paternal Grandmother    Colon cancer Neg Hx    Stomach cancer Neg Hx    Esophageal varices Neg Hx     Destiny Graham is {aware/unaware} of previous family history of genetic testing for hereditary cancer risks. Patient's maternal ancestors are of *** descent, and paternal ancestors are of *** descent. There {IS NO:12509} reported Ashkenazi Jewish ancestry. There {IS NO:12509} known consanguinity.  GENETIC COUNSELING ASSESSMENT: Destiny Graham is a 68 y.o. female with a {Personal/family:20331} history of {cancer/polyps} which is somewhat suggestive of a {DISEASE} and predisposition to cancer given ***. We, therefore, discussed and recommended the following at today's visit.   DISCUSSION: We discussed that *** - ***% of *** is hereditary, with most cases of hereditary *** cancer associated with ***.  There are other genes that can be associated with hereditary *** cancer syndromes.  These include ***.  We discussed that testing is beneficial for several reasons, including knowing about other cancer risks, identifying potential screening and risk-reduction options that may be appropriate, and understanding if other family members could be at an increased risk for cancer and allowing them to undergo  genetic testing.  We reviewed the characteristics, features and inheritance patterns of hereditary cancer syndromes. We also discussed genetic testing, including the appropriate family members to test, the process of testing, insurance coverage and turn-around-time for results. We discussed the implications of a negative, positive, and variant of uncertain significant result. ***We discussed that negative results would be uninformative given that Destiny Graham does not have a personal history of cancer. We recommended Destiny Graham pursue genetic testing for a panel that contains genes associated with ***.  Destiny Graham was offered a common hereditary cancer panel (~40 genes) and an expanded pan-cancer panel (~70 genes). Destiny Graham was informed of the benefits and limitations of each panel, including that expanded pan-cancer panels contain several genes that do not have clear management guidelines at this point in time.  We also discussed that as the number of genes included on a panel increases, the chances of variants of uncertain significance increases.  After considering the benefits and limitations of each gene panel, Ms.  Graham elected to have an *** through ***.   Based on Destiny Graham's {Personal/family:20331} history of cancer, she meets medical criteria for genetic testing. Despite that she meets criteria, she may still have an out of pocket cost. We discussed that if her out of pocket cost for testing is over $100, the laboratory should contact them to discuss self-pay options and/or patient pay assistance programs.   ***We reviewed the characteristics, features and inheritance patterns of hereditary cancer syndromes. We also discussed genetic testing, including the appropriate family members to test, the process of testing, insurance coverage and turn-around-time for results. We discussed the implications of a negative, positive and/or variant of uncertain significant result. In order to get genetic test  results in a timely manner so that Ms. Smouse can use these genetic test results for surgical decisions, we recommended Ms. Kazmierczak pursue genetic testing for the ***. Once complete, we recommend Ms. Rebstock pursue reflex genetic testing to the *** gene panel.   Based on Destiny Graham's {Personal/family:20331} history of cancer, she meets medical criteria for genetic testing. Despite that she meets criteria, she may still have an out of pocket cost.   ***We discussed with Destiny Graham that the {Personal/family:20331} history does not meet insurance or NCCN criteria for genetic testing and, therefore, is not highly consistent with a familial hereditary cancer syndrome.  We feel she is at low risk to harbor a gene mutation associated with such a condition. Thus, we did not recommend any genetic testing, at this time, and recommended Destiny Graham continue to follow the cancer screening guidelines given by her primary healthcare provider.  ***In order to estimate her chance of having a {CA GENE:62345} mutation, we used statistical models ({GENMODELS:62370}) that consider her personal medical history, family history and ancestry.  Because each model is different, there can be a lot of variability in the risks they give.  Therefore, these numbers must be considered a rough range and not a precise risk of having a {CA GENE:62345} mutation.  These models estimate that she has approximately a ***-***% chance of having a mutation. Based on this assessment of her family and personal history, genetic testing {IS/ISNOT:34056} recommended.  ***Based on the patient's {Personal/family:20331} history, a statistical model ({GENMODELS:62370}) was used to estimate her risk of developing {CA HX:54794}. This estimates her lifetime risk of developing {CA HX:54794} to be approximately ***%. This estimation does not consider any genetic testing results.  The patient's lifetime breast cancer risk is a preliminary estimate based on available  information using one of several models endorsed by the American Cancer Society (ACS). The ACS recommends consideration of breast MRI screening as an adjunct to mammography for patients at high risk (defined as 20% or greater lifetime risk).   ***Destiny Graham has been determined to be at high risk for breast cancer.  Therefore, we recommend that annual screening with mammography and breast MRI be performed.  ***begin at age 67, or 10 years prior to the age of breast cancer diagnosis in a relative (whichever is earlier).  We discussed that Destiny Graham should discuss her individual situation with her referring physician and determine a breast cancer screening plan with which they are both comfortable.    We discussed the Genetic Information Non-Discrimination Act (GINA) of 2008, which helps protect individuals against genetic discrimination based on their genetic test results.  It impacts both health insurance and employment.  With health insurance, it protects against genetic test results being used for increased premiums or policy termination.  For employment, it protects against hiring, firing and promoting decisions based on genetic test results.  GINA does not apply to those in the [employer], those who work for companies with less than 15 employees, and new life insurance or long-term disability insurance policies.  Health status due to a cancer diagnosis is not protected under GINA.  PLAN: After considering the risks, benefits, and limitations, Destiny Graham provided informed consent to pursue genetic testing and the blood sample was sent to {Lab} Laboratories for analysis of the {test}. Results should be available within approximately {TAT TIME} weeks' time, at which point they will be disclosed by telephone to Destiny Graham, as will any additional recommendations warranted by these results. Ms. Macfadden will receive a summary of her genetic counseling visit and a copy of her results once available. This information  will also be available in Epic.   *** Despite our recommendation, Ms. Urbas did not wish to pursue genetic testing at today's visit. We understand this decision and remain available to coordinate genetic testing at any time in the future. We, therefore, recommend Ms. Gioffre continue to follow the cancer screening guidelines given by her primary healthcare provider.  ***Based on Ms. Antwine's family history, we recommended her ***, who was diagnosed with *** at age ***, have genetic counseling and testing. Ms. Hay will let us know if we can be of any assistance in coordinating genetic counseling and/or testing for appropriate family members.   Lastly, we encouraged Ms. Suell to remain in contact with cancer genetics annually so that we can continuously update the family history and inform her of any changes in cancer genetics and testing that may be of benefit for this family.   Ms. Haxton questions were answered to her satisfaction today. Our contact information was provided should additional questions or concerns arise. ***Thank you for the referral and allowing [place] to share in the care of your patient.   Sherlie Boyum M. Rennie Plowman, MS, [facility] Genetic Counselor [email or web address] (P) [phone]    *** minutes were spent on the date of the encounter in service to the patient including preparation, face-to-face consultation, documentation and care coordination.  ***The patient was accompanied by ***.  ***The patient was seen alone.  Drs. Gunnar Bulla and/or Mosetta Putt were available to discuss this case as needed.   _______________________________________________________________________ For Office Staff:  Number of people involved in session: *** Was an Intern/ student involved with case: {YES/NO:63}

## 2023-12-06 ENCOUNTER — Inpatient Hospital Stay: Payer: Commercial Managed Care - PPO | Admitting: Genetic Counselor

## 2023-12-06 ENCOUNTER — Inpatient Hospital Stay: Payer: Commercial Managed Care - PPO

## 2023-12-06 ENCOUNTER — Telehealth: Payer: Self-pay | Admitting: Genetic Counselor

## 2023-12-06 NOTE — Telephone Encounter (Signed)
 Patient called--- is feeling sick.  Said she is unable to make genetic counseling appt today.  Said she will call back to reschedule.

## 2023-12-07 ENCOUNTER — Encounter: Payer: Self-pay | Admitting: Gastroenterology

## 2023-12-10 ENCOUNTER — Other Ambulatory Visit (INDEPENDENT_AMBULATORY_CARE_PROVIDER_SITE_OTHER): Payer: Self-pay

## 2023-12-10 ENCOUNTER — Other Ambulatory Visit (HOSPITAL_COMMUNITY): Payer: Self-pay

## 2023-12-10 MED ORDER — RABEPRAZOLE SODIUM 20 MG PO TBEC
DELAYED_RELEASE_TABLET | ORAL | 3 refills | Status: AC
  Filled 2023-12-10 – 2023-12-17 (×3): qty 90, 48d supply, fill #0
  Filled 2024-02-13: qty 90, 90d supply, fill #1
  Filled 2024-05-10: qty 90, 90d supply, fill #2
  Filled 2024-08-19: qty 90, 90d supply, fill #3

## 2023-12-14 ENCOUNTER — Other Ambulatory Visit: Payer: Self-pay

## 2023-12-14 ENCOUNTER — Other Ambulatory Visit (HOSPITAL_COMMUNITY): Payer: Self-pay

## 2023-12-17 ENCOUNTER — Other Ambulatory Visit (HOSPITAL_COMMUNITY): Payer: Self-pay

## 2023-12-17 ENCOUNTER — Encounter (HOSPITAL_COMMUNITY): Payer: Self-pay

## 2024-01-16 ENCOUNTER — Other Ambulatory Visit (HOSPITAL_COMMUNITY): Payer: Self-pay

## 2024-01-30 ENCOUNTER — Encounter: Payer: Self-pay | Admitting: Medical-Surgical

## 2024-01-31 ENCOUNTER — Other Ambulatory Visit (HOSPITAL_BASED_OUTPATIENT_CLINIC_OR_DEPARTMENT_OTHER): Payer: Self-pay

## 2024-01-31 ENCOUNTER — Other Ambulatory Visit (HOSPITAL_COMMUNITY): Payer: Self-pay

## 2024-01-31 ENCOUNTER — Telehealth (INDEPENDENT_AMBULATORY_CARE_PROVIDER_SITE_OTHER): Admitting: Medical-Surgical

## 2024-01-31 ENCOUNTER — Other Ambulatory Visit: Payer: Self-pay

## 2024-01-31 DIAGNOSIS — E890 Postprocedural hypothyroidism: Secondary | ICD-10-CM

## 2024-01-31 DIAGNOSIS — F341 Dysthymic disorder: Secondary | ICD-10-CM | POA: Diagnosis not present

## 2024-01-31 MED ORDER — FLUOXETINE HCL 20 MG PO CAPS
20.0000 mg | ORAL_CAPSULE | Freq: Every day | ORAL | 3 refills | Status: DC
  Filled 2024-01-31: qty 90, 90d supply, fill #0

## 2024-01-31 MED ORDER — DULOXETINE HCL 30 MG PO CPEP
30.0000 mg | ORAL_CAPSULE | Freq: Every day | ORAL | 0 refills | Status: DC
  Filled 2024-01-31: qty 30, 30d supply, fill #0

## 2024-01-31 NOTE — Progress Notes (Signed)
 Virtual Visit via Video Note  I connected with Destiny Graham on 02/02/24 at  2:40 PM EDT by a video enabled telemedicine application and verified that I am speaking with the correct person using two identifiers.   I discussed the limitations of evaluation and management by telemedicine and the availability of in person appointments. The patient expressed understanding and agreed to proceed.  Patient location: home Provider locations: office  Subjective:    CC: thyroid , mood  HPI: Pleasant 68 year old female presenting via MyChart video visit to discuss the following:  Postablative hypothyroidism: currently taking levothyroxine  125mcg daily, tolerating well without side effects. Overdue for recheck of TSH. No concerning symptoms and reports feeling euthyroid.   Dysthymia: has been on Cymbalta  60mg  daily long term but doesn't feel as if it is working as well. Has had worsening depressive symptoms. Used Fluoxetine in the past with good results at the 40mg  daily dose. Requesting to switch back to Fluoxetine. Denies SI/HI.  Past medical history, Surgical history, Family history not pertinant except as noted below, Social history, Allergies, and medications have been entered into the medical record, reviewed, and corrections made.   Review of Systems: See HPI for pertinent positives and negatives.   Objective:    General: Speaking clearly in complete sentences without any shortness of breath.  Alert and oriented x3.  Normal judgment. No apparent acute distress.  Impression and Recommendations:    1. Postoperative hypothyroidism (Primary) Rechecking TSH. Continue levothyroxine  as prescribed, may need titration depending on results.  - TSH  2. Dysthymia Discussed tapering medications. Plan for downtaper of Cymbalta  and up taper of Fluoxetine. Patient verbalized understanding of taper instructions. Sending Cymbalta  30mg  daily and Fluoxetine 20mg  capsules to enable downtaper. Goal dose of  Fluoxetine 40mg  daily.   I discussed the assessment and treatment plan with the patient. The patient was provided an opportunity to ask questions and all were answered. The patient agreed with the plan and demonstrated an understanding of the instructions.   The patient was advised to call back or seek an in-person evaluation if the symptoms worsen or if the condition fails to improve as anticipated.  Return in about 3 months (around 05/02/2024) for annual physical exam or sooner if needed.  Maryl Snook, DNP, APRN, FNP-BC [facility]

## 2024-02-02 ENCOUNTER — Encounter: Payer: Self-pay | Admitting: Medical-Surgical

## 2024-02-07 ENCOUNTER — Other Ambulatory Visit (HOSPITAL_COMMUNITY): Payer: Self-pay

## 2024-02-09 ENCOUNTER — Other Ambulatory Visit: Payer: Self-pay | Admitting: Medical-Surgical

## 2024-02-11 NOTE — Telephone Encounter (Signed)
 Labs are still active  Last virtual visit 01/31/2024

## 2024-02-13 ENCOUNTER — Other Ambulatory Visit: Payer: Self-pay

## 2024-02-13 ENCOUNTER — Encounter: Payer: Self-pay | Admitting: Medical-Surgical

## 2024-02-13 ENCOUNTER — Other Ambulatory Visit: Payer: Self-pay | Admitting: Medical-Surgical

## 2024-02-13 MED ORDER — LEVOTHYROXINE SODIUM 125 MCG PO TABS
125.0000 ug | ORAL_TABLET | Freq: Every day | ORAL | 0 refills | Status: DC
  Filled 2024-02-13: qty 90, 90d supply, fill #0

## 2024-02-18 DIAGNOSIS — G5761 Lesion of plantar nerve, right lower limb: Secondary | ICD-10-CM | POA: Diagnosis not present

## 2024-02-25 ENCOUNTER — Other Ambulatory Visit (HOSPITAL_COMMUNITY): Payer: Self-pay

## 2024-02-25 DIAGNOSIS — E890 Postprocedural hypothyroidism: Secondary | ICD-10-CM | POA: Diagnosis not present

## 2024-02-26 LAB — TSH: TSH: 7.58 u[IU]/mL — ABNORMAL HIGH (ref 0.450–4.500)

## 2024-02-28 ENCOUNTER — Telehealth: Payer: Self-pay

## 2024-02-28 ENCOUNTER — Other Ambulatory Visit (HOSPITAL_COMMUNITY): Payer: Self-pay

## 2024-02-28 NOTE — Telephone Encounter (Signed)
 Copied from CRM [id number]. Topic: Clinical - Prescription Issue >> Feb 28, 2024  1:51 PM Kevelyn M wrote: Reason for CRM:   Pattie Borders with [facility] calling because they have switch to another brand for Levothyroxine , they need permission to use the [facility] brand. Call back#  [phone]

## 2024-02-29 ENCOUNTER — Ambulatory Visit: Payer: Self-pay | Admitting: Family Medicine

## 2024-02-29 DIAGNOSIS — E890 Postprocedural hypothyroidism: Secondary | ICD-10-CM

## 2024-02-29 NOTE — Progress Notes (Signed)
 Hi Destiny Graham, covering for Joy while she is out for her shoulder surgery.  TSH is elevated.  It looks like you take 125 mcg daily on the levothyroxine .  If that is correct then let me know, and I can bump up your dose.  It looks like last time you are actually little overmedicated this time a little undermedicated so we just need to find a good in between.

## 2024-03-03 ENCOUNTER — Other Ambulatory Visit (HOSPITAL_COMMUNITY): Payer: Self-pay

## 2024-03-03 ENCOUNTER — Encounter (HOSPITAL_COMMUNITY): Payer: Self-pay

## 2024-03-03 MED ORDER — LEVOTHYROXINE SODIUM 137 MCG PO TABS
125.0000 ug | ORAL_TABLET | Freq: Every day | ORAL | 3 refills | Status: DC
  Filled 2024-03-03: qty 90, 90d supply, fill #0

## 2024-03-03 NOTE — Telephone Encounter (Signed)
 This request has been handled. [facility] pharmacy tech advised of the covering provider's note. Pharm tech will inform the patient of completing labs for med safety & efficacy check. No further action is required.

## 2024-03-04 ENCOUNTER — Other Ambulatory Visit: Payer: Self-pay

## 2024-03-04 ENCOUNTER — Other Ambulatory Visit (HOSPITAL_COMMUNITY): Payer: Self-pay

## 2024-03-05 ENCOUNTER — Other Ambulatory Visit: Payer: Self-pay

## 2024-03-05 ENCOUNTER — Encounter: Payer: Self-pay | Admitting: Pharmacist

## 2024-03-25 ENCOUNTER — Encounter: Payer: Self-pay | Admitting: Medical-Surgical

## 2024-03-25 ENCOUNTER — Other Ambulatory Visit (HOSPITAL_COMMUNITY): Payer: Self-pay

## 2024-03-27 MED ORDER — FLUOXETINE HCL 40 MG PO CAPS
40.0000 mg | ORAL_CAPSULE | Freq: Every day | ORAL | 0 refills | Status: DC
Start: 2024-03-27 — End: 2024-03-28

## 2024-03-28 ENCOUNTER — Other Ambulatory Visit: Payer: Self-pay | Admitting: Medical-Surgical

## 2024-03-28 ENCOUNTER — Other Ambulatory Visit (HOSPITAL_COMMUNITY): Payer: Self-pay

## 2024-03-28 MED ORDER — FLUOXETINE HCL 40 MG PO CAPS
40.0000 mg | ORAL_CAPSULE | Freq: Every day | ORAL | 3 refills | Status: DC
  Filled 2024-03-28 – 2024-04-07 (×2): qty 90, 90d supply, fill #0

## 2024-04-08 ENCOUNTER — Other Ambulatory Visit: Payer: Self-pay

## 2024-04-17 ENCOUNTER — Other Ambulatory Visit: Payer: Self-pay | Admitting: Medical-Surgical

## 2024-04-17 DIAGNOSIS — G4733 Obstructive sleep apnea (adult) (pediatric): Secondary | ICD-10-CM

## 2024-04-17 NOTE — Patient Instructions (Signed)
 Preventive Care 43 Years and Older, Female Preventive care refers to lifestyle choices and visits with your health care provider that can promote health and wellness. Preventive care visits are also called wellness exams. What can I expect for my preventive care visit? Counseling Your health care provider may ask you questions about your: Medical history, including: Past medical problems. Family medical history. Pregnancy and menstrual history. History of falls. Current health, including: Memory and ability to understand (cognition). Emotional well-being. Home life and relationship well-being. Sexual activity and sexual health. Lifestyle, including: Alcohol, nicotine or tobacco, and drug use. Access to firearms. Diet, exercise, and sleep habits. Work and work Astronomer. Sunscreen use. Safety issues such as seatbelt and bike helmet use. Physical exam Your health care provider will check your: Height and weight. These may be used to calculate your BMI (body mass index). BMI is a measurement that tells if you are at a healthy weight. Waist circumference. This measures the distance around your waistline. This measurement also tells if you are at a healthy weight and may help predict your risk of certain diseases, such as type 2 diabetes and high blood pressure. Heart rate and blood pressure. Body temperature. Skin for abnormal spots. What immunizations do I need?  Vaccines are usually given at various ages, according to a schedule. Your health care provider will recommend vaccines for you based on your age, medical history, and lifestyle or other factors, such as travel or where you work. What tests do I need? Screening Your health care provider may recommend screening tests for certain conditions. This may include: Lipid and cholesterol levels. Hepatitis C test. Hepatitis B test. HIV (human immunodeficiency virus) test. STI (sexually transmitted infection) testing, if you are at  risk. Lung cancer screening. Colorectal cancer screening. Diabetes screening. This is done by checking your blood sugar (glucose) after you have not eaten for a while (fasting). Mammogram. Talk with your health care provider about how often you should have regular mammograms. BRCA-related cancer screening. This may be done if you have a family history of breast, ovarian, tubal, or peritoneal cancers. Bone density scan. This is done to screen for osteoporosis. Talk with your health care provider about your test results, treatment options, and if necessary, the need for more tests. Follow these instructions at home: Eating and drinking  Eat a diet that includes fresh fruits and vegetables, whole grains, lean protein, and low-fat dairy products. Limit your intake of foods with high amounts of sugar, saturated fats, and salt. Take vitamin and mineral supplements as recommended by your health care provider. Do not drink alcohol if your health care provider tells you not to drink. If you drink alcohol: Limit how much you have to 0-1 drink a day. Know how much alcohol is in your drink. In the [place] one drink equals one 12 oz bottle of beer (355 mL), one 5 oz glass of wine (148 mL), or one 1 oz glass of hard liquor (44 mL). Lifestyle Brush your teeth every morning and night with fluoride toothpaste. Floss one time each day. Exercise for at least 30 minutes 5 or more days each week. Do not use any products that contain nicotine or tobacco. These products include cigarettes, chewing tobacco, and vaping devices, such as e-cigarettes. If you need help quitting, ask your health care provider. Do not use drugs. If you are sexually active, practice safe sex. Use a condom or other form of protection in order to prevent STIs. Take aspirin only as told by  your health care provider. Make sure that you understand how much to take and what form to take. Work with your health care provider to find out whether it  is safe and beneficial for you to take aspirin daily. Ask your health care provider if you need to take a cholesterol-lowering medicine (statin). Find healthy ways to manage stress, such as: Meditation, yoga, or listening to music. Journaling. Talking to a trusted person. Spending time with friends and family. Minimize exposure to UV radiation to reduce your risk of skin cancer. Safety Always wear your seat belt while driving or riding in a vehicle. Do not drive: If you have been drinking alcohol. Do not ride with someone who has been drinking. When you are tired or distracted. While texting. If you have been using any mind-altering substances or drugs. Wear a helmet and other protective equipment during sports activities. If you have firearms in your house, make sure you follow all gun safety procedures. What's next? Visit your health care provider once a year for an annual wellness visit. Ask your health care provider how often you should have your eyes and teeth checked. Stay up to date on all vaccines. This information is not intended to replace advice given to you by your health care provider. Make sure you discuss any questions you have with your health care provider. Document Revised: 02/23/2021 Document Reviewed: 02/23/2021 Elsevier Patient Education  2024 [employer].

## 2024-04-17 NOTE — Progress Notes (Unsigned)
 Complete physical exam  Patient: Destiny Graham   DOB: Mar 03, 1956   68 y.o. Female  MRN: [id number]  Subjective:    No chief complaint on file.   [name] is a 68 y.o. female who presents today for a complete physical exam. She reports consuming a {diet types:17450} diet. {types:19826} She generally feels {DESC; WELL/FAIRLY WELL/POORLY:18703}. She reports sleeping {DESC; WELL/FAIRLY WELL/POORLY:18703}. She {does/does not:200015} have additional problems to discuss today.    Most recent fall risk assessment:    08/15/2023    1:53 PM  Fall Risk   Falls in the past year? 1  Number falls in past yr: 0  Injury with Fall? 1     Most recent depression screenings:    08/30/2021    1:41 PM 03/21/2021   11:10 AM  PHQ 2/9 Scores  PHQ - 2 Score 6 6  PHQ- 9 Score 17 13    {VISON DENTAL STD PSA (Optional):27386}  {History (Optional):23778}  Patient Care Team: Willo Mini, NP as PCP - General (Nurse Practitioner)   Outpatient Medications Prior to Visit  Medication Sig   acetaminophen  (TYLENOL ) 500 MG tablet Take 2 tablets (1,000 mg total) by mouth every 6 (six) hours as needed for mild pain (pain score 1-3) or headache.   atenolol  (TENORMIN ) 25 MG tablet Take 1 tablet (25 mg total) by mouth daily. Take in addition to the 50 mg dose once daily. (Patient taking differently: Take 25 mg by mouth at bedtime. In addition to 50mg  dose in the morning.)   atenolol  (TENORMIN ) 50 MG tablet Take 1 tablet (50 mg total) by mouth daily.   estradiol  (ESTRACE  VAGINAL) 0.1 MG/GM vaginal cream Place 1 Applicatorful vaginally at bedtime. (Patient taking differently: Place 1 Applicatorful vaginally at bedtime. 3 times a week)   FLUoxetine  (PROZAC ) 40 MG capsule Take 1 capsule (40 mg total) by mouth daily. Taper off Cymbalta  with up taper of Fluoxetine    levothyroxine  (SYNTHROID ) 137 MCG tablet Take 1 tablet (137 mcg total) by mouth daily before breakfast.   naproxen sodium (ALEVE) 220 MG tablet Take  440 mg by mouth 2 (two) times daily as needed (arthritis pain).   ondansetron  (ZOFRAN -ODT) 8 MG disintegrating tablet Take 1 tablet (8 mg total) by mouth every 8 (eight) hours as needed for nausea.   Propylene Glycol (SYSTANE COMPLETE OP) Place 1 drop into both eyes daily as needed (irritation).   RABEprazole  (ACIPHEX ) 20 MG tablet Take 1 tablet (20 mg total) by mouth 2 (two) times daily for 6 weeks, THEN 1 tablet (20 mg total) daily.   rosuvastatin  (CRESTOR ) 20 MG tablet Take 1 tablet (20 mg total) by mouth daily. NEEDS APPOINTMENT FOR FURTHER REFILLS.   tretinoin  (RETIN-A ) 0.05 % cream APPLY ON TO THE SKIN EVERY NIGHT AT BEDTIME   valsartan  (DIOVAN ) 40 MG tablet Take 1 tablet (40 mg total) by mouth daily.   No facility-administered medications prior to visit.    ROS        Objective:     There were no vitals taken for this visit. {Vitals History (Optional):23777}  Physical Exam   No results found for any visits on 04/18/24. {Show previous labs (optional):23779}    Assessment & Plan:    Routine Health Maintenance and Physical Exam  Immunization History  Administered Date(s) Administered   Fluad Quad(high Dose 65+) 06/18/2020, 05/31/2021, 06/29/2022   Influenza-Unspecified 06/12/2019, 07/11/2019, 06/29/2023   PFIZER(Purple Top)SARS-COV-2 Vaccination 09/09/2019, 09/30/2019, 06/11/2020   Pfizer Covid-19 Vaccine Bivalent Booster 6yrs & up  05/30/2021   Pfizer(Comirnaty)Fall Seasonal Vaccine 12 years and older 06/29/2022, 09/27/2022   Pneumococcal Conjugate-13 03/24/2020   Pneumococcal Polysaccharide-23 06/20/2022   Tdap 04/08/2019   Zoster Recombinant(Shingrix) 10/17/2017    Health Maintenance  Topic Date Due   Zoster Vaccines- Shingrix (2 of 2) 12/12/2017   COVID-19 Vaccine (7 - 2024-25 season) 05/13/2023   INFLUENZA VACCINE  04/11/2024   Hepatitis C Screening  09/11/2048 (Originally 09/30/1973)   MAMMOGRAM  05/23/2025   Pneumococcal Vaccine: 50+ Years (2 of 2 - PCV20  or PCV21) 06/21/2027   DTaP/Tdap/Td (2 - Td or Tdap) 04/07/2029   Colonoscopy  08/28/2033   DEXA SCAN  Completed   Hepatitis B Vaccines  Aged Out   HPV VACCINES  Aged Out   Meningococcal B Vaccine  Aged Out    Discussed health benefits of physical activity, and encouraged her to engage in regular exercise appropriate for her age and condition.  Problem List Items Addressed This Visit       Cardiovascular and Mediastinum   Essential hypertension     Respiratory   OSA on CPAP     Endocrine   Hypothyroidism     Other   Anxiety   Dysthymia   Hyperlipidemia   Psychophysiological insomnia   Other Visit Diagnoses       Annual physical exam    -  Primary      No follow-ups on file.     Brennen Gardiner, NP

## 2024-04-18 ENCOUNTER — Other Ambulatory Visit: Payer: Self-pay

## 2024-04-18 ENCOUNTER — Other Ambulatory Visit (HOSPITAL_COMMUNITY): Payer: Self-pay

## 2024-04-18 ENCOUNTER — Ambulatory Visit (INDEPENDENT_AMBULATORY_CARE_PROVIDER_SITE_OTHER): Admitting: Medical-Surgical

## 2024-04-18 ENCOUNTER — Encounter: Payer: Self-pay | Admitting: Medical-Surgical

## 2024-04-18 VITALS — BP 113/63 | HR 61 | Ht 67.0 in | Wt 184.0 lb

## 2024-04-18 DIAGNOSIS — Z23 Encounter for immunization: Secondary | ICD-10-CM

## 2024-04-18 DIAGNOSIS — Z124 Encounter for screening for malignant neoplasm of cervix: Secondary | ICD-10-CM

## 2024-04-18 DIAGNOSIS — G4733 Obstructive sleep apnea (adult) (pediatric): Secondary | ICD-10-CM

## 2024-04-18 DIAGNOSIS — Z9049 Acquired absence of other specified parts of digestive tract: Secondary | ICD-10-CM | POA: Diagnosis not present

## 2024-04-18 DIAGNOSIS — F5104 Psychophysiologic insomnia: Secondary | ICD-10-CM

## 2024-04-18 DIAGNOSIS — Z0000 Encounter for general adult medical examination without abnormal findings: Secondary | ICD-10-CM | POA: Diagnosis not present

## 2024-04-18 DIAGNOSIS — Z780 Asymptomatic menopausal state: Secondary | ICD-10-CM

## 2024-04-18 DIAGNOSIS — E890 Postprocedural hypothyroidism: Secondary | ICD-10-CM | POA: Diagnosis not present

## 2024-04-18 DIAGNOSIS — F341 Dysthymic disorder: Secondary | ICD-10-CM | POA: Diagnosis not present

## 2024-04-18 DIAGNOSIS — E7800 Pure hypercholesterolemia, unspecified: Secondary | ICD-10-CM

## 2024-04-18 DIAGNOSIS — I10 Essential (primary) hypertension: Secondary | ICD-10-CM

## 2024-04-18 DIAGNOSIS — F419 Anxiety disorder, unspecified: Secondary | ICD-10-CM | POA: Diagnosis not present

## 2024-04-18 DIAGNOSIS — Z1231 Encounter for screening mammogram for malignant neoplasm of breast: Secondary | ICD-10-CM

## 2024-04-18 MED ORDER — AMITRIPTYLINE HCL 10 MG PO TABS
10.0000 mg | ORAL_TABLET | Freq: Every day | ORAL | 1 refills | Status: DC
  Filled 2024-04-18: qty 90, 90d supply, fill #0

## 2024-04-18 MED ORDER — ATENOLOL 50 MG PO TABS
50.0000 mg | ORAL_TABLET | Freq: Every day | ORAL | 3 refills | Status: AC
  Filled 2024-04-18: qty 90, 90d supply, fill #0
  Filled 2024-06-30 – 2024-07-01 (×2): qty 90, 90d supply, fill #1
  Filled 2024-10-10: qty 90, 90d supply, fill #2

## 2024-04-18 MED ORDER — VALSARTAN 40 MG PO TABS
40.0000 mg | ORAL_TABLET | Freq: Every day | ORAL | 3 refills | Status: AC
  Filled 2024-04-18 – 2024-05-23 (×2): qty 90, 90d supply, fill #0
  Filled 2024-08-19: qty 90, 90d supply, fill #1

## 2024-04-18 MED ORDER — ATENOLOL 25 MG PO TABS
25.0000 mg | ORAL_TABLET | Freq: Every evening | ORAL | 3 refills | Status: AC
  Filled 2024-04-18 – 2024-05-28 (×7): qty 90, 90d supply, fill #0
  Filled 2024-08-19: qty 90, 90d supply, fill #1

## 2024-04-18 MED ORDER — ROSUVASTATIN CALCIUM 20 MG PO TABS
20.0000 mg | ORAL_TABLET | Freq: Every day | ORAL | 3 refills | Status: AC
  Filled 2024-04-18: qty 90, 90d supply, fill #0
  Filled 2024-07-13: qty 90, 90d supply, fill #1
  Filled 2024-10-10: qty 90, 90d supply, fill #2

## 2024-04-19 LAB — CMP14+EGFR
ALT: 32 IU/L (ref 0–32)
AST: 29 IU/L (ref 0–40)
Albumin: 4.1 g/dL (ref 3.9–4.9)
Alkaline Phosphatase: 95 IU/L (ref 44–121)
BUN/Creatinine Ratio: 14 (ref 12–28)
BUN: 15 mg/dL (ref 8–27)
Bilirubin Total: 0.4 mg/dL (ref 0.0–1.2)
CO2: 25 mmol/L (ref 20–29)
Calcium: 9.2 mg/dL (ref 8.7–10.3)
Chloride: 98 mmol/L (ref 96–106)
Creatinine, Ser: 1.05 mg/dL — ABNORMAL HIGH (ref 0.57–1.00)
Globulin, Total: 2 g/dL (ref 1.5–4.5)
Glucose: 77 mg/dL (ref 70–99)
Potassium: 4.5 mmol/L (ref 3.5–5.2)
Sodium: 136 mmol/L (ref 134–144)
Total Protein: 6.1 g/dL (ref 6.0–8.5)
eGFR: 58 mL/min/1.73 — ABNORMAL LOW (ref >59)

## 2024-04-19 LAB — CBC WITH DIFFERENTIAL/PLATELET
Basophils Absolute: 0.1 x10E3/uL (ref 0.0–0.2)
Basos: 1 %
EOS (ABSOLUTE): 0.2 x10E3/uL (ref 0.0–0.4)
Eos: 3 %
Hematocrit: 39.8 % (ref 34.0–46.6)
Hemoglobin: 12.7 g/dL (ref 11.1–15.9)
Immature Grans (Abs): 0 x10E3/uL (ref 0.0–0.1)
Immature Granulocytes: 0 %
Lymphocytes Absolute: 2.2 x10E3/uL (ref 0.7–3.1)
Lymphs: 31 %
MCH: 31 pg (ref 26.6–33.0)
MCHC: 31.9 g/dL (ref 31.5–35.7)
MCV: 97 fL (ref 79–97)
Monocytes Absolute: 0.7 x10E3/uL (ref 0.1–0.9)
Monocytes: 10 %
Neutrophils Absolute: 3.8 x10E3/uL (ref 1.4–7.0)
Neutrophils: 55 %
Platelets: 288 x10E3/uL (ref 150–450)
RBC: 4.1 x10E6/uL (ref 3.77–5.28)
RDW: 14.2 % (ref 11.7–15.4)
WBC: 6.9 x10E3/uL (ref 3.4–10.8)

## 2024-04-19 LAB — MAGNESIUM: Magnesium: 2.3 mg/dL (ref 1.6–2.3)

## 2024-04-19 LAB — TSH: TSH: 12.2 u[IU]/mL — ABNORMAL HIGH (ref 0.450–4.500)

## 2024-04-19 LAB — LIPID PANEL
Chol/HDL Ratio: 1.8 ratio (ref 0.0–4.4)
Cholesterol, Total: 203 mg/dL — ABNORMAL HIGH (ref 100–199)
HDL: 114 mg/dL (ref >39)
LDL Chol Calc (NIH): 69 mg/dL (ref 0–99)
Triglycerides: 120 mg/dL (ref 0–149)
VLDL Cholesterol Cal: 20 mg/dL (ref 5–40)

## 2024-04-20 ENCOUNTER — Telehealth: Payer: Self-pay | Admitting: Family Medicine

## 2024-04-20 MED ORDER — PAXLOVID (300/100) 20 X 150 MG & 10 X 100MG PO TBPK: 3.0000 | ORAL_TABLET | Freq: Two times a day (BID) | ORAL | 0 refills | Status: AC

## 2024-04-20 NOTE — Telephone Encounter (Signed)
 Patient tested positive for COVID-19 today Sunday, 04/20/2024.  Request Paxlovid .  Paxlovid  sent to patient's pharmacy per her request.

## 2024-04-21 ENCOUNTER — Other Ambulatory Visit: Payer: Self-pay

## 2024-04-21 ENCOUNTER — Ambulatory Visit: Payer: Self-pay | Admitting: Medical-Surgical

## 2024-04-21 ENCOUNTER — Other Ambulatory Visit (HOSPITAL_COMMUNITY): Payer: Self-pay

## 2024-04-21 MED ORDER — LEVOTHYROXINE SODIUM 150 MCG PO TABS
150.0000 ug | ORAL_TABLET | Freq: Every day | ORAL | 3 refills | Status: AC
  Filled 2024-04-21: qty 90, 90d supply, fill #0
  Filled 2024-07-13: qty 90, 90d supply, fill #1
  Filled 2024-10-10: qty 90, 90d supply, fill #2

## 2024-04-24 ENCOUNTER — Other Ambulatory Visit (HOSPITAL_COMMUNITY): Payer: Self-pay

## 2024-04-28 NOTE — Telephone Encounter (Signed)
**Note De-identified  [place] Obfuscation** Please advise 

## 2024-04-28 NOTE — Telephone Encounter (Signed)
 Sorry I was out of office last week I will place order for CPAP  Needs compliance check in 8 weeks

## 2024-04-29 ENCOUNTER — Other Ambulatory Visit: Payer: Self-pay

## 2024-05-09 ENCOUNTER — Other Ambulatory Visit (HOSPITAL_COMMUNITY): Payer: Self-pay

## 2024-05-10 ENCOUNTER — Other Ambulatory Visit (HOSPITAL_COMMUNITY): Payer: Self-pay

## 2024-05-11 ENCOUNTER — Other Ambulatory Visit: Payer: Self-pay

## 2024-05-23 ENCOUNTER — Other Ambulatory Visit (HOSPITAL_COMMUNITY): Payer: Self-pay

## 2024-05-28 ENCOUNTER — Other Ambulatory Visit (HOSPITAL_COMMUNITY): Payer: Self-pay

## 2024-06-05 ENCOUNTER — Ambulatory Visit

## 2024-06-05 DIAGNOSIS — Z1231 Encounter for screening mammogram for malignant neoplasm of breast: Secondary | ICD-10-CM | POA: Diagnosis not present

## 2024-06-10 ENCOUNTER — Other Ambulatory Visit: Payer: Self-pay | Admitting: Medical-Surgical

## 2024-06-10 DIAGNOSIS — N6489 Other specified disorders of breast: Secondary | ICD-10-CM

## 2024-06-10 DIAGNOSIS — R928 Other abnormal and inconclusive findings on diagnostic imaging of breast: Secondary | ICD-10-CM

## 2024-06-18 ENCOUNTER — Ambulatory Visit: Payer: Self-pay | Admitting: Medical-Surgical

## 2024-06-18 ENCOUNTER — Other Ambulatory Visit: Payer: Self-pay | Admitting: Medical-Surgical

## 2024-06-18 ENCOUNTER — Ambulatory Visit
Admission: RE | Admit: 2024-06-18 | Discharge: 2024-06-18 | Disposition: A | Discharge status: Home / Self Care | Source: Ambulatory Visit | Attending: Medical-Surgical | Admitting: Medical-Surgical

## 2024-06-18 DIAGNOSIS — N6320 Unspecified lump in the left breast, unspecified quadrant: Secondary | ICD-10-CM | POA: Diagnosis not present

## 2024-06-18 DIAGNOSIS — R928 Other abnormal and inconclusive findings on diagnostic imaging of breast: Secondary | ICD-10-CM

## 2024-06-18 DIAGNOSIS — N6489 Other specified disorders of breast: Secondary | ICD-10-CM

## 2024-06-20 ENCOUNTER — Ambulatory Visit
Admission: RE | Admit: 2024-06-20 | Discharge: 2024-06-20 | Disposition: A | Discharge status: Home / Self Care | Source: Ambulatory Visit | Attending: Medical-Surgical | Admitting: Medical-Surgical

## 2024-06-20 DIAGNOSIS — N6320 Unspecified lump in the left breast, unspecified quadrant: Secondary | ICD-10-CM

## 2024-06-20 DIAGNOSIS — N6321 Unspecified lump in the left breast, upper outer quadrant: Secondary | ICD-10-CM | POA: Diagnosis not present

## 2024-06-20 HISTORY — PX: BREAST BIOPSY: SHX20

## 2024-06-23 LAB — SURGICAL PATHOLOGY

## 2024-06-28 ENCOUNTER — Encounter: Payer: Self-pay | Admitting: Medical-Surgical

## 2024-06-30 ENCOUNTER — Other Ambulatory Visit: Payer: Self-pay

## 2024-06-30 ENCOUNTER — Other Ambulatory Visit (HOSPITAL_COMMUNITY): Payer: Self-pay

## 2024-06-30 MED ORDER — FLUOXETINE HCL 20 MG PO TABS
40.0000 mg | ORAL_TABLET | Freq: Every day | ORAL | 3 refills | Status: AC
  Filled 2024-06-30: qty 180, 90d supply, fill #0
  Filled 2024-09-23: qty 180, 90d supply, fill #1
  Filled 2024-10-10: qty 180, 90d supply, fill #2

## 2024-07-02 ENCOUNTER — Other Ambulatory Visit

## 2024-07-14 ENCOUNTER — Other Ambulatory Visit (HOSPITAL_COMMUNITY): Payer: Self-pay

## 2024-07-16 DIAGNOSIS — G5761 Lesion of plantar nerve, right lower limb: Secondary | ICD-10-CM | POA: Diagnosis not present

## 2024-07-16 DIAGNOSIS — M79671 Pain in right foot: Secondary | ICD-10-CM | POA: Diagnosis not present

## 2024-07-21 ENCOUNTER — Telehealth (INDEPENDENT_AMBULATORY_CARE_PROVIDER_SITE_OTHER): Admitting: Medical-Surgical

## 2024-07-21 ENCOUNTER — Encounter: Payer: Self-pay | Admitting: Medical-Surgical

## 2024-07-21 DIAGNOSIS — Z91199 Patient's noncompliance with other medical treatment and regimen due to unspecified reason: Secondary | ICD-10-CM

## 2024-07-21 NOTE — Progress Notes (Signed)
 Patient called just before noon to check in for the 1320 virtual visit.  No answer at that time.  Voicemail left.  Provider logged onto the MyChart virtual space at 1320.  A text link was sent at 1321.  Attempted to call at 1330, no answer.  Another text link sent at 1331 PM.  Patient did not join the virtual space.  Provider signed off at 1337.  No-show for appointment.

## 2024-07-31 ENCOUNTER — Telehealth: Payer: Self-pay | Admitting: Gastroenterology

## 2024-07-31 DIAGNOSIS — R197 Diarrhea, unspecified: Secondary | ICD-10-CM

## 2024-07-31 NOTE — Telephone Encounter (Signed)
 PT is calling to have a nurse speak with her regarding her diarrhea and how to manage her symptoms. Please advise.

## 2024-08-01 ENCOUNTER — Other Ambulatory Visit: Payer: Self-pay

## 2024-08-01 ENCOUNTER — Other Ambulatory Visit (HOSPITAL_COMMUNITY): Payer: Self-pay

## 2024-08-01 MED ORDER — DIPHENOXYLATE-ATROPINE 2.5-0.025 MG PO TABS
1.0000 | ORAL_TABLET | Freq: Four times a day (QID) | ORAL | 0 refills | Status: DC | PRN
  Filled 2024-08-01: qty 30, 8d supply, fill #0

## 2024-08-01 NOTE — Telephone Encounter (Signed)
 Discussed PA recommendations with patient. She will come by for labs & to pick stool kit up at next earliest convenience. Labs/stool already ordered & prescription sent in to correct pharmacy. Patient had no further questions.

## 2024-08-01 NOTE — Telephone Encounter (Signed)
 Returned call to Dr. Maranda, she has concerns for ongoing uncontrollable watery diarrhea. History of lap hemicolectomy 10/2023, and was told at the time that diarrhea should resolve after 6 months however it continues to be all day, several days. There are times when she has to step away when seeing a patient or having a meal. She's taking 6+ imodium PRN, which only provides some relief. Denies any pain, n/v. She's also taking a fiber supplement. Avoiding high fat diet. Currently Dr. San is booking into January & patient would like to know what she can do in the meantime.

## 2024-08-01 NOTE — Telephone Encounter (Signed)
 Please get Diatherix C. difficile with GI pathogen panel to ensure there is not an infection. It also appears that she has had some abnormal thyroid  labs I see where it was last checked in August, I think she needs to repeat a TSH, CBC, c-Met. If the thyroid  is normal and there is no infection we could potentially do a trial of Xifaxan or Flagyl  250 mg 3 times daily for 10 days for small intestinal bacterial overgrowth which can be common after her surgery.  I can send in Lomotil  for the diarrhea. Make sure she is on fiber least 2-3 times daily all fiber does add dirt to the liquid mud so could help solidify her stool and slow things down.  Go to the ER if you are not peeing regularly, unable to take oral fluids, vomiting, severe weakness/dizziness, severe abdominal pain , chest pain or shortness of breath.

## 2024-08-05 ENCOUNTER — Other Ambulatory Visit (INDEPENDENT_AMBULATORY_CARE_PROVIDER_SITE_OTHER)

## 2024-08-05 DIAGNOSIS — R197 Diarrhea, unspecified: Secondary | ICD-10-CM | POA: Diagnosis not present

## 2024-08-05 LAB — CBC WITH DIFFERENTIAL/PLATELET
Basophils Absolute: 0.1 K/uL (ref 0.0–0.1)
Basophils Relative: 0.9 % (ref 0.0–3.0)
Eosinophils Absolute: 0.2 K/uL (ref 0.0–0.7)
Eosinophils Relative: 2.6 % (ref 0.0–5.0)
HCT: 41 % (ref 36.0–46.0)
Hemoglobin: 13.6 g/dL (ref 12.0–15.0)
Lymphocytes Relative: 29.1 % (ref 12.0–46.0)
Lymphs Abs: 1.9 K/uL (ref 0.7–4.0)
MCHC: 33.2 g/dL (ref 30.0–36.0)
MCV: 93.5 fl (ref 78.0–100.0)
Monocytes Absolute: 0.6 K/uL (ref 0.1–1.0)
Monocytes Relative: 9.7 % (ref 3.0–12.0)
Neutro Abs: 3.8 K/uL (ref 1.4–7.7)
Neutrophils Relative %: 57.7 % (ref 43.0–77.0)
Platelets: 272 K/uL (ref 150.0–400.0)
RBC: 4.38 Mil/uL (ref 3.87–5.11)
RDW: 14.3 % (ref 11.5–15.5)
WBC: 6.6 K/uL (ref 4.0–10.5)

## 2024-08-05 LAB — COMPREHENSIVE METABOLIC PANEL WITH GFR
ALT: 27 U/L (ref 0–35)
AST: 28 U/L (ref 0–37)
Albumin: 4.3 g/dL (ref 3.5–5.2)
Alkaline Phosphatase: 72 U/L (ref 39–117)
BUN: 17 mg/dL (ref 6–23)
CO2: 32 meq/L (ref 19–32)
Calcium: 9.3 mg/dL (ref 8.4–10.5)
Chloride: 98 meq/L (ref 96–112)
Creatinine, Ser: 1.1 mg/dL (ref 0.40–1.20)
GFR: 51.15 mL/min — ABNORMAL LOW (ref >60.00)
Glucose, Bld: 99 mg/dL (ref 70–99)
Potassium: 4.1 meq/L (ref 3.5–5.1)
Sodium: 135 meq/L (ref 135–145)
Total Bilirubin: 0.6 mg/dL (ref 0.2–1.2)
Total Protein: 6.7 g/dL (ref 6.0–8.3)

## 2024-08-05 LAB — TSH: TSH: 2.41 u[IU]/mL (ref 0.35–5.50)

## 2024-08-06 ENCOUNTER — Ambulatory Visit: Payer: Self-pay | Admitting: Physician Assistant

## 2024-08-06 DIAGNOSIS — R197 Diarrhea, unspecified: Secondary | ICD-10-CM

## 2024-08-09 DIAGNOSIS — G4733 Obstructive sleep apnea (adult) (pediatric): Secondary | ICD-10-CM | POA: Diagnosis not present

## 2024-08-12 NOTE — Telephone Encounter (Signed)
 Patient called requesting an update regarding stool study results. Advised patient it does not look like they have came back yet. Please advise, thank you

## 2024-08-14 NOTE — Telephone Encounter (Signed)
 Inbound call from patient stating that she drop off her stool sample the same day she had her labs draw which was  November the 25,2025. Patient stated that if we had anymore question to please reach out to her. Please advise.

## 2024-08-19 ENCOUNTER — Other Ambulatory Visit (HOSPITAL_COMMUNITY): Payer: Self-pay

## 2024-08-19 ENCOUNTER — Encounter (HOSPITAL_COMMUNITY): Payer: Self-pay

## 2024-09-08 DIAGNOSIS — G4733 Obstructive sleep apnea (adult) (pediatric): Secondary | ICD-10-CM | POA: Diagnosis not present

## 2024-09-23 ENCOUNTER — Other Ambulatory Visit: Payer: Self-pay

## 2024-09-23 ENCOUNTER — Other Ambulatory Visit: Payer: Self-pay | Admitting: Physician Assistant

## 2024-09-23 ENCOUNTER — Other Ambulatory Visit (HOSPITAL_COMMUNITY): Payer: Self-pay

## 2024-09-23 MED ORDER — DIPHENOXYLATE-ATROPINE 2.5-0.025 MG PO TABS
1.0000 | ORAL_TABLET | Freq: Four times a day (QID) | ORAL | 1 refills | Status: AC | PRN
  Filled 2024-09-23: qty 30, 8d supply, fill #0
  Filled 2024-10-10: qty 30, 8d supply, fill #1

## 2024-09-24 ENCOUNTER — Other Ambulatory Visit (HOSPITAL_COMMUNITY): Payer: Self-pay

## 2024-09-24 ENCOUNTER — Other Ambulatory Visit: Payer: Self-pay

## 2024-09-24 ENCOUNTER — Encounter: Payer: Self-pay | Admitting: Pharmacist

## 2024-09-29 ENCOUNTER — Other Ambulatory Visit (HOSPITAL_COMMUNITY): Payer: Self-pay

## 2024-10-10 ENCOUNTER — Other Ambulatory Visit: Payer: Self-pay

## 2024-10-10 ENCOUNTER — Encounter: Payer: Self-pay | Admitting: Pharmacy Technician

## 2024-10-14 ENCOUNTER — Encounter: Payer: Self-pay | Admitting: Gastroenterology

## 2024-10-14 DIAGNOSIS — R197 Diarrhea, unspecified: Secondary | ICD-10-CM

## 2024-10-15 ENCOUNTER — Telehealth: Payer: Self-pay

## 2024-10-15 ENCOUNTER — Other Ambulatory Visit: Payer: Self-pay

## 2024-10-15 NOTE — Telephone Encounter (Signed)
 MyChart message sent from patient requesting for stool specimens for diarrhea. Patient previously had this lab work orders sent to [facility] in [place] to complete, but never had this done.  Patient is still having watery diarrhea, and asked to have lab work done at this office. New orders placed per previous encounter / provider recommendations from Alan Coombs PA for diatherix c-diff/h-pylori. Kit placed at 2nd floor front desk. MyChart message sent notifying patient and recommending that patient make a follow up appointment to further discuss symptoms/concerns.
